# Patient Record
Sex: Female | Born: 1945
Health system: Southern US, Community
[De-identification: ages and names within clinical notes are randomized; demographics above are authoritative.]

## PROBLEM LIST (undated history)

## (undated) DIAGNOSIS — F419 Anxiety disorder, unspecified: Secondary | ICD-10-CM

## (undated) DIAGNOSIS — M1990 Unspecified osteoarthritis, unspecified site: Secondary | ICD-10-CM

## (undated) DIAGNOSIS — R413 Other amnesia: Secondary | ICD-10-CM

## (undated) DIAGNOSIS — N390 Urinary tract infection, site not specified: Secondary | ICD-10-CM

## (undated) DIAGNOSIS — I10 Essential (primary) hypertension: Secondary | ICD-10-CM

## (undated) DIAGNOSIS — J302 Other seasonal allergic rhinitis: Secondary | ICD-10-CM

## (undated) DIAGNOSIS — E039 Hypothyroidism, unspecified: Secondary | ICD-10-CM

## (undated) DIAGNOSIS — R001 Bradycardia, unspecified: Secondary | ICD-10-CM

## (undated) HISTORY — DX: Anxiety disorder, unspecified: F41.9

## (undated) HISTORY — DX: Hypothyroidism, unspecified: E03.9

## (undated) HISTORY — DX: Other amnesia: R41.3

## (undated) HISTORY — DX: Essential (primary) hypertension: I10

## (undated) HISTORY — PX: LAPAROSCOPIC GASTRIC SLEEVE RESECTION: SHX5895

## (undated) HISTORY — DX: Urinary tract infection, site not specified: N39.0

## (undated) HISTORY — PX: FINGER SURGERY: SHX640

## (undated) HISTORY — PX: KNEE SURGERY: SHX244

## (undated) HISTORY — PX: TONSILLECTOMY AND ADENOIDECTOMY: SHX28

## (undated) HISTORY — PX: BLEPHAROPLASTY: SUR158

## (undated) HISTORY — DX: Unspecified osteoarthritis, unspecified site: M19.90

## (undated) HISTORY — DX: Other seasonal allergic rhinitis: J30.2

## (undated) HISTORY — PX: CATARACT EXTRACTION, BILATERAL: SHX1313

---

## 2002-05-07 HISTORY — PX: HERNIA REPAIR: SHX51

## 2007-11-20 ENCOUNTER — Other Ambulatory Visit: Admission: RE | Admit: 2007-11-20 | Discharge: 2007-11-20 | Payer: Self-pay | Admitting: Obstetrics and Gynecology

## 2011-01-18 ENCOUNTER — Ambulatory Visit (INDEPENDENT_AMBULATORY_CARE_PROVIDER_SITE_OTHER): Payer: BC Managed Care – PPO | Admitting: Orthopedic Surgery

## 2011-01-18 ENCOUNTER — Encounter: Payer: Self-pay | Admitting: Orthopedic Surgery

## 2011-01-18 VITALS — BP 130/78 | Ht 65.0 in | Wt 248.0 lb

## 2011-01-18 DIAGNOSIS — S83289A Other tear of lateral meniscus, current injury, unspecified knee, initial encounter: Secondary | ICD-10-CM

## 2011-01-18 DIAGNOSIS — M1710 Unilateral primary osteoarthritis, unspecified knee: Secondary | ICD-10-CM

## 2011-01-18 DIAGNOSIS — M1712 Unilateral primary osteoarthritis, left knee: Secondary | ICD-10-CM

## 2011-01-18 NOTE — Progress Notes (Signed)
Chief complaint: LEFT knee pain, soreness, stiffness HPI:(66) A 65 year old female twisted her LEFT knee back in April still has lateral knee pain.  Complains of dull throbbing 5 out 10 intermittent pain which is worse with walking driving or riding better with elevation rest and wrapping with a brace or bandage.  The locking catching and swelling.  ROS:(2) She reports shortness of breath and wheezing with heartburn frequency joint pain swelling and instability stiffness muscle pain anxiety depression easy bruising bleeding cold intolerance and seasonal ALLERGY  PFSH: (1)  Past Medical History  Diagnosis Date  . HTN (hypertension)   . Hypothyroid   . Arthritis   . Asthma   . Seasonal allergies   . Urinary tract infection    Past Surgical History  Procedure Date  . Cesarean section   . Hernia repair   . Tonsillectomy and adenoidectomy      Physical Exam(12) GENERAL: normal development   CDV: pulses are normal   Skin: normal  Lymph: nodes were not palpable/normal  Psychiatric: awake, alert and oriented  Neuro: normal sensation  MSK She is ambulating today no assistive devices.  She has lateral tenderness no joint effusion.  Flexion equals 120.  Strength is normal.  Knee is stable.  McMurray sign was positive for lateral knee pain.  Jointline tenderness none on the medial side.  Ligaments are stable  Imaging: X-rays in the office valgus osteoarthritis  Assessment: Lateral meniscal tear in the setting of valgus osteoarthritis    Plan: MRI LEFT knee  Separate x-ray report  3 views LEFT knee  There is joint space narrowing medially and laterally with more joint space loss laterally.  Valgus knee.  Impression osteoarthritis with valgus deformity

## 2011-01-22 ENCOUNTER — Telehealth: Payer: Self-pay | Admitting: Radiology

## 2011-01-22 NOTE — Telephone Encounter (Signed)
I called to give the patient her MRI appointment at [facility] on 01-24-11 at 10:30. Patient has [employer], no precert is needed per Benjamin. Reference #[id number]. Patient will follow up here for her results.

## 2011-01-24 ENCOUNTER — Ambulatory Visit (HOSPITAL_COMMUNITY)
Admission: RE | Admit: 2011-01-24 | Discharge: 2011-01-24 | Disposition: A | Discharge status: Home / Self Care | Payer: BC Managed Care – PPO | Source: Ambulatory Visit | Attending: Orthopedic Surgery | Admitting: Orthopedic Surgery

## 2011-01-24 DIAGNOSIS — IMO0002 Reserved for concepts with insufficient information to code with codable children: Secondary | ICD-10-CM | POA: Insufficient documentation

## 2011-01-24 DIAGNOSIS — M1710 Unilateral primary osteoarthritis, unspecified knee: Secondary | ICD-10-CM | POA: Insufficient documentation

## 2011-01-24 DIAGNOSIS — M7120 Synovial cyst of popliteal space [Baker], unspecified knee: Secondary | ICD-10-CM | POA: Insufficient documentation

## 2011-01-24 DIAGNOSIS — S83289A Other tear of lateral meniscus, current injury, unspecified knee, initial encounter: Secondary | ICD-10-CM

## 2011-01-24 DIAGNOSIS — M25569 Pain in unspecified knee: Secondary | ICD-10-CM | POA: Insufficient documentation

## 2011-02-01 ENCOUNTER — Encounter: Payer: Self-pay | Admitting: Orthopedic Surgery

## 2011-02-01 ENCOUNTER — Ambulatory Visit (INDEPENDENT_AMBULATORY_CARE_PROVIDER_SITE_OTHER): Payer: BC Managed Care – PPO | Admitting: Orthopedic Surgery

## 2011-02-01 DIAGNOSIS — M1710 Unilateral primary osteoarthritis, unspecified knee: Secondary | ICD-10-CM

## 2011-02-01 DIAGNOSIS — S83289A Other tear of lateral meniscus, current injury, unspecified knee, initial encounter: Secondary | ICD-10-CM

## 2011-02-01 NOTE — Patient Instructions (Signed)
Come back the first of the year to recheck knee possibly discuss knee arthroscopy

## 2011-02-01 NOTE — Progress Notes (Signed)
Follow up visit  Diagnosis osteoarthritis Torn lateral meniscus LEFT knee MRI followup  MRI reviewed with report  Lateral meniscal degenerative type tears and lateral compartment gonarthrosis with multicompartment arthritis  We discussed her findings and her current function.  She has 2 maintain her at [facility] [place] right now and is managing fine with that.  I would think that she can continue this and we can see her at the first of the year to see if anything needs to be done which I would recommend arthroscopic surgery.  She does understand that long term she may need knee replacement surgery.  Final diagnosis osteoarthritis with lateral meniscal tear  Treatment plan conservative with scheduled followup

## 2011-05-16 ENCOUNTER — Telehealth: Payer: Self-pay | Admitting: Orthopedic Surgery

## 2011-05-16 NOTE — Telephone Encounter (Signed)
Patient elects to wait till May for knee re-eval appointment scheduled for 05/16/11, due to insurance reasons.  States her knee is not any worse, and she has been following the recommendations per Dr. Romeo Apple at her initial visit.  She has re-scheduled for May, which is when her new insurance will be effective, however, said if any problems, she will call back to request an earlier date for the appointment.

## 2011-05-17 ENCOUNTER — Ambulatory Visit: Payer: BC Managed Care – PPO | Admitting: Orthopedic Surgery

## 2011-09-13 ENCOUNTER — Encounter: Payer: Self-pay | Admitting: Orthopedic Surgery

## 2011-09-13 ENCOUNTER — Ambulatory Visit (INDEPENDENT_AMBULATORY_CARE_PROVIDER_SITE_OTHER): Payer: Medicare Other | Admitting: Orthopedic Surgery

## 2011-09-13 VITALS — BP 116/72 | Ht 65.0 in | Wt 237.0 lb

## 2011-09-13 DIAGNOSIS — Z9889 Other specified postprocedural states: Secondary | ICD-10-CM

## 2011-09-13 MED ORDER — HYDROCODONE-ACETAMINOPHEN 7.5-325 MG PO TABS: 1.0000 | ORAL_TABLET | ORAL | Status: AC | PRN

## 2011-09-13 NOTE — Patient Instructions (Addendum)
You have been scheduled for arthroscocpic knee surgery.  All surgeries carry some risk.  Remember you always have the option of continued nonsurgical treatment. However in this situation the risks vs. the benefits favor surgery as the best treatment option. The risks of the surgery includes the following but is not limited to bleeding, infection, pulmonary embolus, death from anesthesia, nerve injury vascular injury or need for further surgery, continued pain.  Specific to this procedure the following risks and complications are rare but possible Stiffness, pain, weakness, giving out  I expect  recovery will be in 3-4 weeks some patients take 6 weeks.  You  will need physical therapy after the procedure  STOP ALL NSAIDS AND BLOOD THINNERS   Arthroscopic Procedure, Knee An arthroscopic procedure can find what is wrong with your knee. PROCEDURE Arthroscopy is a surgical technique that allows your orthopedic surgeon to diagnose and treat your knee injury with accuracy. They will look into your knee through a small instrument. This is almost like a small (pencil sized) telescope. Because arthroscopy affects your knee less than open knee surgery, you can anticipate a more rapid recovery. Taking an active role by following your caregiver's instructions will help with rapid and complete recovery. Use crutches, rest, elevation, ice, and knee exercises as instructed. The length of recovery depends on various factors including type of injury, age, physical condition, medical conditions, and your rehabilitation. Your knee is the joint between the large bones (femur and tibia) in your leg. Cartilage covers these bone ends which are smooth and slippery and allow your knee to bend and move smoothly. Two menisci, thick, semi-lunar shaped pads of cartilage which form a rim inside the joint, help absorb shock and stabilize your knee. Ligaments bind the bones together and support your knee joint. Muscles move the joint,  help support your knee, and take stress off the joint itself. Because of this all programs and physical therapy to rehabilitate an injured or repaired knee require rebuilding and strengthening your muscles. AFTER THE PROCEDURE  After the procedure, you will be moved to a recovery area until most of the effects of the medication have worn off. Your caregiver will discuss the test results with you.   Only take over-the-counter or prescription medicines for pain, discomfort, or fever as directed by your caregiver.  SEEK MEDICAL CARE IF:    You have increased bleeding from your wounds.   You see redness, swelling, or have increasing pain in your wounds.   You have pus coming from your wound.   You have an oral temperature above 102 F (38.9 C).   You notice a bad smell coming from the wound or dressing.   You have severe pain with any motion of your knee.  SEEK IMMEDIATE MEDICAL CARE IF:    You develop a rash.   You have difficulty breathing.   You have any allergic problems.  Document Released: 04/20/2000 Document Revised: 04/12/2011 Document Reviewed: 11/12/2007 [facility] Patient Information 2012 [place], [place].

## 2011-09-13 NOTE — Progress Notes (Signed)
Patient ID: Lisa Parker, female   DOB: Oct 06, 1945, 66 y.o.   MRN: [id number] [facility]   Chief Complaint  Patient presents with  . Follow-up    recheck left knee   Followup left knee pain persists patient is scheduled for arthroscopic surgery history and physical be dictated at a later date incorporated by reference  Patient understands risks and benefits of surgery versus nonoperative treatment and wishes to proceed with arthroscopic surgery debridement and partial meniscectomy as needed.

## 2011-09-13 NOTE — H&P (Signed)
  Chief complaint: LEFT knee pain, soreness, stiffness   HPI: A 66 year old female twisted her LEFT knee back in April still has lateral knee pain. Complains of dull throbbing 5 out 10 intermittent pain which is worse with walking driving or riding better with elevation rest and wrapping with a brace or bandage. He has also experienced locking and catching of the knee. She works or manages an apple orchard and she could not have surgery after MRI showed arthritis and torn lateral meniscus. She was treated conservatively and now presents back wishing to have the surgical procedure on the left knee. In the past we have talked about replacement versus arthroscopy and she is willing to undergo arthroscopy understanding that she may knee replacement in the future.  The informed consent process was completed in the office details are noted in her progress Notes.  ROS: She reports shortness of breath and wheezing with heartburn frequency joint pain swelling and instability stiffness muscle pain anxiety depression easy bruising bleeding cold intolerance and seasonal ALLERGY   PFSH:   Past Medical History   Diagnosis  Date   .  HTN (hypertension)    .  Hypothyroid    .  Arthritis    .  Asthma    .  Seasonal allergies    .  Urinary tract infection     Past Surgical History   Procedure  Date   .  Cesarean section    .  Hernia repair    .  Tonsillectomy and adenoidectomy     History   Social History  . Marital Status: Married    Spouse Name: N/A    Number of Children: N/A  . Years of Education: college   Occupational History  . farm work    Social History Main Topics  . Smoking status: Never Smoker   . Smokeless tobacco: Not on file  . Alcohol Use: No  . Drug Use: No  . Sexually Active: Not on file   Other Topics Concern  . Not on file   Social History Narrative  . No narrative on file    Physical Exam(12)  Vital signs are stable as recorded  General appearance is  normal  The patient is alert and oriented x3  The patient's mood and affect are normal  The cardiovascular exam reveals normal pulses and temperature without edema swelling.  The lymphatic system is negative for palpable lymph nodes  The sensory exam is normal.  There are no pathologic reflexes.  Balance is normal.  Upper extremity exam  Inspection and palpation revealed no abnormalities in the upper extremities.  Range of motion is full without contracture.  Motor exam is normal with grade 5 strength.  The joints are fully reduced without subluxation.  There is no atrophy or tremor and muscle tone is normal.  All joints are stable.  She is ambulating today no assistive devices.  She has lateral tenderness no joint effusion. Flexion equals 120. Strength is normal. Knee is stable. McMurray sign was positive for lateral knee pain. Jointline tenderness none on the medial side. Ligaments are stable   MRI report  Diagnosis #1 lateral meniscal tear Diagnoses #2 osteoarthritis with valgus deformity  Plan arthroscopy left knee with partial lateral meniscectomy and debridement

## 2011-09-14 ENCOUNTER — Other Ambulatory Visit: Payer: Self-pay | Admitting: *Deleted

## 2011-09-17 ENCOUNTER — Encounter (HOSPITAL_COMMUNITY): Payer: Self-pay | Admitting: Pharmacist

## 2011-09-20 NOTE — Patient Instructions (Addendum)
20 Lisa Parker  09/20/2011   Your procedure is scheduled on:  Friday, May 24  [facility] to [facility] at [id number].  Call this number if you have problems the morning of surgery: [phone]   Remember:   Do not eat food:After Midnight.  May have clear liquids:until Midnight .  Clear liquids include soda, tea, black coffee, apple or grape juice, broth.  Take these medicines the morning of surgery with A SIP OF WATER: Levothyroxine, Lexapro,bisoprolol-hydrochlorothiazide,amlodipine-benazepril. Tylenol, if needed.   Do not wear jewelry, make-up or nail polish.  Do not wear lotions, powders, or perfumes. You may wear deodorant.  Do not shave 48 hours prior to surgery. Men may shave face and neck.  Do not bring valuables to the hospital.  Contacts, dentures or bridgework may not be worn into surgery.  Leave suitcase in the car. After surgery it may be brought to your room.  For patients admitted to the hospital, checkout time is 11:00 AM the day of discharge.   Patients discharged the day of surgery will not be allowed to drive home.  Name and phone number of your driver: Family  Special Instructions: CHG Shower Use Special Wash: 1/2 bottle night before surgery and 1/2 bottle morning of surgery.   Please read over the following fact sheets that you were given: Pain Booklet, Coughing and Deep Breathing, MRSA Information, Surgical Site Infection Prevention, Anesthesia Post-op Instructions and Care and Recovery After Surgery  PATIENT INSTRUCTIONS POST-ANESTHESIA  IMMEDIATELY FOLLOWING SURGERY:  Do not drive or operate machinery for the first twenty four hours after surgery.  Do not make any important decisions for twenty four hours after surgery or while taking narcotic pain medications or sedatives.  If you develop intractable nausea and vomiting or a severe headache please notify your doctor immediately.  FOLLOW-UP:  Please make an appointment with your surgeon as instructed. You do not need to  follow up with anesthesia unless specifically instructed to do so.  WOUND CARE INSTRUCTIONS (if applicable):  Keep a dry clean dressing on the anesthesia/puncture wound site if there is drainage.  Once the wound has quit draining you may leave it open to air.  Generally you should leave the bandage intact for twenty four hours unless there is drainage.  If the epidural site drains for more than 36-48 hours please call the anesthesia department.  QUESTIONS?:  Please feel free to call your physician or the hospital operator if you have any questions, and they will be happy to assist you.     Arthroscopic Procedure, Knee An arthroscopic procedure can find what is wrong with your knee. PROCEDURE Arthroscopy is a surgical technique that allows your orthopedic surgeon to diagnose and treat your knee injury with accuracy. They will look into your knee through a small instrument. This is almost like a small (pencil sized) telescope. Because arthroscopy affects your knee less than open knee surgery, you can anticipate a more rapid recovery. Taking an active role by following your caregiver's instructions will help with rapid and complete recovery. Use crutches, rest, elevation, ice, and knee exercises as instructed. The length of recovery depends on various factors including type of injury, age, physical condition, medical conditions, and your rehabilitation. Your knee is the joint between the large bones (femur and tibia) in your leg. Cartilage covers these bone ends which are smooth and slippery and allow your knee to bend and move smoothly. Two menisci, thick, semi-lunar shaped pads of cartilage which form a rim inside the  joint, help absorb shock and stabilize your knee. Ligaments bind the bones together and support your knee joint. Muscles move the joint, help support your knee, and take stress off the joint itself. Because of this all programs and physical therapy to rehabilitate an injured or repaired knee  require rebuilding and strengthening your muscles. AFTER THE PROCEDURE  After the procedure, you will be moved to a recovery area until most of the effects of the medication have worn off. Your caregiver will discuss the test results with you.   Only take over-the-counter or prescription medicines for pain, discomfort, or fever as directed by your caregiver.  SEEK MEDICAL CARE IF:   You have increased bleeding from your wounds.   You see redness, swelling, or have increasing pain in your wounds.   You have pus coming from your wound.   You have an oral temperature above 102 F (38.9 C).   You notice a bad smell coming from the wound or dressing.   You have severe pain with any motion of your knee.  SEEK IMMEDIATE MEDICAL CARE IF:   You develop a rash.   You have difficulty breathing.   You have any allergic problems.   Surgery for Meniscus Tear with Phase I Rehab INDICATIONS - (WHO NEEDS SURGERY, WHEN, WHY, AND GOALS)   Surgery is advised for people with meniscus tears who experience locking, recurring swelling, and/or giving way of the knee.   Surgery is advised if non-surgical treatment has failed.   Sometimes, surgery is advised for people with pain or tenderness along the joint line.   Surgery is advised for people with displaced tears that prevent full knee range of motion ("locked knee"), or if there is a sign of a "bucket handle" tear (meniscus tears and flips to the center of the knee).   Surgery to repair a meniscus tear is elective (the patient chooses to have surgery). There is no evidence that the timing of surgery has an affect on the outcome of surgery.   Menisci have poor blood supply and little ability to heal. For this reason, less than 20% of all meniscus tears are repairable by sewing (suturing) them together. The rest are treated by removal of all or part of the meniscus (meniscectomy).   If a meniscectomy is performed, the meniscus does not reform.    If the meniscus tear occurs without an anterior cruciate ligament (ACL) tear, the success rate of surgery is approximately 80%. However, if an ACL tear is present, the success rate is about 40%. If the meniscus tear is repairable, most surgeons also recommend reconstructing the ACL.   One function of the meniscus is to reduce forces in the knee. Because of this, the loss of meniscus cartilage is linked with the early development of arthritis of the knee joint. The goal of meniscus surgery is to remove as little of the meniscus as possible.   Removing all or part of a torn meniscus allows for shaping of the cartilage and removal of torn edges, which prevents:   Progression of the tear (making a tear larger).   Displacement of the tear, causing recurring symptoms of locking, giving way, and swelling.   If a meniscus tear does not cause problems, it may be left alone. However, torn meniscus cartilage does not function, and the development of arthritis or symptoms such as locking, swelling, and giving way may still occur. Further, tears may progress to become larger, if left untreated.  CONTRAINDICATIONS - (REASONS NOT  TO OPERATE)   Infection of the knee.   Inability or unwillingness to complete a rehabilitation program.   Pain or symptoms not related to the meniscus.   Arthritis of the knee, which causes the (symptomatic) meniscus tear.  RISKS AND COMPLICATIONS  Infection.   Bleeding.   Injury to nerves (numbness, weakness, paralysis).   Recurring symptoms (giving way, locking, swelling), including tearing the remaining meniscus if removal of the affected meniscus (meniscectomy) is performed, and re-tear or nonhealing of the meniscus repair.   Knee stiffness (loss of knee motion).   Continued pain.   Weakness of the thigh (quadriceps) muscles.   Do not eat or drink anything before surgery. Solid food makes general anesthesia more hazardous.  PROCEDURE  Meniscus tear surgery is  performed through an incision near the joint (arthroscopically), and you go home the same day as surgery (outpatient basis). The procedure may be completed with general anesthesia, spinal anesthesia, or local anesthesia. The procedure involves using small power tools to remove the torn portion of the meniscus. If the tear is repairable, the edges of the tear are freshened. Then, sewing (sutures), anchors, or tacks are used to hold the torn edges together, while the meniscus heals.  AFTER THE PROCEDURE   Keep the wound clean and dry after the surgery (usually two weeks).   Keep the foot and ankle elevated above heart level whenever possible, for the first 1 to 2 weeks after surgery.   You will be given pain medicines by your caregiver.   Ice the knee to reduce inflammation.   You may put as much weight on the operated leg as possible, although you will often be given crutches after surgery, until you can walk without a limp.   If the tear is repaired (sewn back together), you may be given a brace, and possibly be allowed to bear full weight on the operated leg while you are wearing the brace, for varying periods (depends on your caregiver).   After surgery, rehabilitation and exercises are important to regain motion, and then strength.  RETURN TO SPORTS   Return to sports depends on many factors including:   Type of procedure (meniscus repair or removal).   Type of sport.   Position played.   It may take 6 weeks before sports can be resumed after meniscus removal (although it may be as early as 1 to 2 weeks), or 6 to 9 months after a meniscus repair.   Full knee motion and strength are needed, before sports can be resumed.  SEEK MEDICAL CARE IF:   You experience pain, numbness, or coldness in the foot.   Any of the following signs of infection occur after surgery: fever, increased pain, swelling, redness, drainage of fluids, or bleeding in the affected area.   New, unexplained  symptoms develop. (Drugs used in treatment may produce side effects.)

## 2011-09-21 ENCOUNTER — Encounter (HOSPITAL_COMMUNITY): Payer: Self-pay

## 2011-09-21 ENCOUNTER — Encounter (HOSPITAL_COMMUNITY)
Admission: RE | Admit: 2011-09-21 | Discharge: 2011-09-21 | Disposition: A | Discharge status: Home / Self Care | Payer: Medicare Other | Source: Ambulatory Visit | Attending: Orthopedic Surgery | Admitting: Orthopedic Surgery

## 2011-09-21 ENCOUNTER — Telehealth: Payer: Self-pay | Admitting: Orthopedic Surgery

## 2011-09-21 LAB — SURGICAL PCR SCREEN: MRSA, PCR: NEGATIVE

## 2011-09-21 LAB — BASIC METABOLIC PANEL
BUN: 19 mg/dL (ref 6–23)
CO2: 32 mEq/L (ref 19–32)
Calcium: 9.9 mg/dL (ref 8.4–10.5)
Chloride: 101 mEq/L (ref 96–112)
Creatinine, Ser: 0.72 mg/dL (ref 0.50–1.10)
GFR calc Af Amer: >90 mL/min (ref >90)
GFR calc non Af Amer: 88 mL/min — ABNORMAL LOW (ref >90)
Glucose, Bld: 97 mg/dL (ref 70–99)
Potassium: 4.4 mEq/L (ref 3.5–5.1)
Sodium: 140 mEq/L (ref 135–145)

## 2011-09-21 LAB — HEMOGLOBIN AND HEMATOCRIT, BLOOD
HCT: 43.9 % (ref 36.0–46.0)
Hemoglobin: 14.3 g/dL (ref 12.0–15.0)

## 2011-09-21 MED ORDER — CHLORHEXIDINE GLUCONATE 4 % EX LIQD
60.0000 mL | Freq: Once | CUTANEOUS | Status: DC
  Filled 2011-09-21: qty 60

## 2011-09-21 NOTE — Telephone Encounter (Signed)
Contacted insurer, [employer] at ph# [phone] regarding out-patient surgery scheduled 09/28/11 at [facility].  CPT codes: 09811, [phone].  Per representative Bonnita Levan, no pre-authorization is required for these procedures for in-network providers.

## 2011-09-28 ENCOUNTER — Encounter (HOSPITAL_COMMUNITY): Payer: Self-pay | Admitting: Anesthesiology

## 2011-09-28 ENCOUNTER — Ambulatory Visit (HOSPITAL_COMMUNITY)
Admission: RE | Admit: 2011-09-28 | Discharge: 2011-09-28 | Disposition: A | Discharge status: Home / Self Care | Payer: Medicare Other | Source: Ambulatory Visit | Attending: Orthopedic Surgery | Admitting: Orthopedic Surgery

## 2011-09-28 ENCOUNTER — Encounter (HOSPITAL_COMMUNITY)
Admission: RE | Disposition: A | Discharge status: Home / Self Care | Payer: Self-pay | Source: Ambulatory Visit | Attending: Orthopedic Surgery

## 2011-09-28 ENCOUNTER — Encounter (HOSPITAL_COMMUNITY): Payer: Self-pay | Admitting: *Deleted

## 2011-09-28 ENCOUNTER — Ambulatory Visit (HOSPITAL_COMMUNITY): Payer: Medicare Other | Admitting: Anesthesiology

## 2011-09-28 DIAGNOSIS — Z79899 Other long term (current) drug therapy: Secondary | ICD-10-CM | POA: Insufficient documentation

## 2011-09-28 DIAGNOSIS — I10 Essential (primary) hypertension: Secondary | ICD-10-CM | POA: Insufficient documentation

## 2011-09-28 DIAGNOSIS — M23359 Other meniscus derangements, posterior horn of lateral meniscus, unspecified knee: Secondary | ICD-10-CM

## 2011-09-28 DIAGNOSIS — M23349 Other meniscus derangements, anterior horn of lateral meniscus, unspecified knee: Secondary | ICD-10-CM

## 2011-09-28 DIAGNOSIS — X500XXA Overexertion from strenuous movement or load, initial encounter: Secondary | ICD-10-CM | POA: Insufficient documentation

## 2011-09-28 DIAGNOSIS — M1710 Unilateral primary osteoarthritis, unspecified knee: Secondary | ICD-10-CM | POA: Insufficient documentation

## 2011-09-28 DIAGNOSIS — IMO0002 Reserved for concepts with insufficient information to code with codable children: Secondary | ICD-10-CM | POA: Insufficient documentation

## 2011-09-28 DIAGNOSIS — S83289A Other tear of lateral meniscus, current injury, unspecified knee, initial encounter: Secondary | ICD-10-CM | POA: Insufficient documentation

## 2011-09-28 SURGERY — ARTHROSCOPY, KNEE, WITH LATERAL MENISCECTOMY
Anesthesia: General | Site: Knee | Laterality: Left | Wound class: Clean

## 2011-09-28 MED ORDER — ONDANSETRON HCL 4 MG/2ML IJ SOLN: 4.0000 mg | Freq: Once | INTRAMUSCULAR | Status: DC

## 2011-09-28 MED ORDER — PROMETHAZINE HCL 12.5 MG PO TABS: 12.5000 mg | ORAL_TABLET | Freq: Four times a day (QID) | ORAL | Status: DC | PRN

## 2011-09-28 MED ORDER — FENTANYL CITRATE 0.05 MG/ML IJ SOLN
INTRAMUSCULAR | Status: DC | PRN
  Administered 2011-09-28 (×4): 25 ug via INTRAVENOUS

## 2011-09-28 MED ORDER — LIDOCAINE HCL (PF) 1 % IJ SOLN
INTRAMUSCULAR | Status: AC
  Filled 2011-09-28: qty 5

## 2011-09-28 MED ORDER — ACETAMINOPHEN 10 MG/ML IV SOLN
1000.0000 mg | Freq: Once | INTRAVENOUS | Status: AC
  Administered 2011-09-28: 1000 mg via INTRAVENOUS

## 2011-09-28 MED ORDER — SODIUM CHLORIDE 0.9 % IR SOLN
Status: DC | PRN
  Administered 2011-09-28 (×4)

## 2011-09-28 MED ORDER — LIDOCAINE HCL (CARDIAC) 10 MG/ML IV SOLN
INTRAVENOUS | Status: DC | PRN
  Administered 2011-09-28: 50 mg via INTRAVENOUS

## 2011-09-28 MED ORDER — LACTATED RINGERS IV SOLN
INTRAVENOUS | Status: DC
  Administered 2011-09-28: 1000 mL via INTRAVENOUS

## 2011-09-28 MED ORDER — CEFAZOLIN SODIUM 1-5 GM-% IV SOLN
INTRAVENOUS | Status: DC | PRN
  Administered 2011-09-28: 2 g via INTRAVENOUS

## 2011-09-28 MED ORDER — ONDANSETRON HCL 4 MG/2ML IJ SOLN
INTRAMUSCULAR | Status: AC
  Administered 2011-09-28: 4 mg via INTRAVENOUS
  Filled 2011-09-28: qty 2

## 2011-09-28 MED ORDER — CELECOXIB 100 MG PO CAPS
ORAL_CAPSULE | ORAL | Status: AC
  Administered 2011-09-28: 400 mg via ORAL
  Filled 2011-09-28: qty 4

## 2011-09-28 MED ORDER — CEFAZOLIN SODIUM-DEXTROSE 2-3 GM-% IV SOLR: 2.0000 g | INTRAVENOUS | Status: DC

## 2011-09-28 MED ORDER — ONDANSETRON HCL 4 MG/2ML IJ SOLN
4.0000 mg | Freq: Once | INTRAMUSCULAR | Status: AC
  Administered 2011-09-28: 4 mg via INTRAVENOUS

## 2011-09-28 MED ORDER — CEFAZOLIN SODIUM 1-5 GM-% IV SOLN
INTRAVENOUS | Status: AC
  Filled 2011-09-28: qty 50

## 2011-09-28 MED ORDER — TRAMADOL HCL 50 MG PO TABS
ORAL_TABLET | ORAL | Status: AC
  Administered 2011-09-28: 50 mg via ORAL
  Filled 2011-09-28: qty 1

## 2011-09-28 MED ORDER — HYDROCODONE-ACETAMINOPHEN 7.5-325 MG PO TABS: 1.0000 | ORAL_TABLET | Freq: Four times a day (QID) | ORAL | Status: AC | PRN

## 2011-09-28 MED ORDER — ONDANSETRON HCL 4 MG/2ML IJ SOLN
4.0000 mg | Freq: Once | INTRAMUSCULAR | Status: AC | PRN
  Administered 2011-09-28: 4 mg via INTRAVENOUS

## 2011-09-28 MED ORDER — FENTANYL CITRATE 0.05 MG/ML IJ SOLN: 25.0000 ug | INTRAMUSCULAR | Status: DC | PRN

## 2011-09-28 MED ORDER — CEFAZOLIN SODIUM-DEXTROSE 2-3 GM-% IV SOLR
INTRAVENOUS | Status: AC
  Filled 2011-09-28: qty 50

## 2011-09-28 MED ORDER — GLYCOPYRROLATE 0.2 MG/ML IJ SOLN
0.2000 mg | Freq: Once | INTRAMUSCULAR | Status: AC
  Administered 2011-09-28: 0.2 mg via INTRAVENOUS

## 2011-09-28 MED ORDER — TRAMADOL HCL 50 MG PO TABS
50.0000 mg | ORAL_TABLET | Freq: Once | ORAL | Status: AC
  Administered 2011-09-28: 50 mg via ORAL

## 2011-09-28 MED ORDER — LACTATED RINGERS IV SOLN
INTRAVENOUS | Status: DC | PRN
  Administered 2011-09-28: 08:00:00 via INTRAVENOUS

## 2011-09-28 MED ORDER — PROPOFOL 10 MG/ML IV EMUL
INTRAVENOUS | Status: DC | PRN
  Administered 2011-09-28: 150 mg via INTRAVENOUS

## 2011-09-28 MED ORDER — PROPOFOL 10 MG/ML IV EMUL
INTRAVENOUS | Status: AC
  Filled 2011-09-28: qty 20

## 2011-09-28 MED ORDER — ACETAMINOPHEN 10 MG/ML IV SOLN
INTRAVENOUS | Status: AC
  Administered 2011-09-28: 1000 mg via INTRAVENOUS
  Filled 2011-09-28: qty 100

## 2011-09-28 MED ORDER — GLYCOPYRROLATE 0.2 MG/ML IJ SOLN
INTRAMUSCULAR | Status: AC
  Administered 2011-09-28: 0.2 mg via INTRAVENOUS
  Filled 2011-09-28: qty 1

## 2011-09-28 MED ORDER — CELECOXIB 100 MG PO CAPS
400.0000 mg | ORAL_CAPSULE | Freq: Once | ORAL | Status: AC
  Administered 2011-09-28: 400 mg via ORAL

## 2011-09-28 MED ORDER — MIDAZOLAM HCL 2 MG/2ML IJ SOLN
INTRAMUSCULAR | Status: AC
  Administered 2011-09-28: 2 mg via INTRAVENOUS
  Filled 2011-09-28: qty 2

## 2011-09-28 MED ORDER — EPINEPHRINE HCL 1 MG/ML IJ SOLN
INTRAMUSCULAR | Status: AC
  Filled 2011-09-28: qty 2

## 2011-09-28 MED ORDER — BUPIVACAINE-EPINEPHRINE PF 0.5-1:200000 % IJ SOLN
INTRAMUSCULAR | Status: DC | PRN
  Administered 2011-09-28: 60 mL

## 2011-09-28 MED ORDER — FENTANYL CITRATE 0.05 MG/ML IJ SOLN
INTRAMUSCULAR | Status: AC
  Filled 2011-09-28: qty 2

## 2011-09-28 MED ORDER — MIDAZOLAM HCL 2 MG/2ML IJ SOLN
1.0000 mg | INTRAMUSCULAR | Status: DC | PRN
  Administered 2011-09-28: 2 mg via INTRAVENOUS

## 2011-09-28 MED ORDER — ACETAMINOPHEN 325 MG PO TABS: 325.0000 mg | ORAL_TABLET | ORAL | Status: DC | PRN

## 2011-09-28 MED ORDER — ACETAMINOPHEN 10 MG/ML IV SOLN
INTRAVENOUS | Status: AC
  Filled 2011-09-28: qty 100

## 2011-09-28 MED ORDER — BUPIVACAINE-EPINEPHRINE PF 0.5-1:200000 % IJ SOLN
INTRAMUSCULAR | Status: AC
  Filled 2011-09-28: qty 20

## 2011-09-28 SURGICAL SUPPLY — 56 items
ARTHROWAND PARAGON T2 (SURGICAL WAND)
BAG HAMPER (MISCELLANEOUS) ×2 IMPLANT
BANDAGE ELASTIC 6 VELCRO NS (GAUZE/BANDAGES/DRESSINGS) ×2 IMPLANT
BLADE AGGRESSIVE PLUS 4.0 (BLADE) ×2 IMPLANT
BLADE SURG SZ11 CARB STEEL (BLADE) ×2 IMPLANT
CHLORAPREP W/TINT 26ML (MISCELLANEOUS) ×4 IMPLANT
CLOTH BEACON ORANGE TIMEOUT ST (SAFETY) ×2 IMPLANT
COOLER CRYO IC GRAV AND TUBE (ORTHOPEDIC SUPPLIES) ×2 IMPLANT
CUFF CRYO KNEE LG 20X31 COOLER (ORTHOPEDIC SUPPLIES) ×1 IMPLANT
CUFF CRYO KNEE18X23 MED (MISCELLANEOUS) IMPLANT
CUFF TOURNIQUET SINGLE 34IN LL (TOURNIQUET CUFF) IMPLANT
CUFF TOURNIQUET SINGLE 44IN (TOURNIQUET CUFF) ×1 IMPLANT
CUTTER ANGLED DBL BITE 4.5 (BURR) IMPLANT
DECANTER SPIKE VIAL GLASS SM (MISCELLANEOUS) ×4 IMPLANT
FLOOR PAD 36X40 (MISCELLANEOUS)
GAUZE SPONGE 4X4 16PLY XRAY LF (GAUZE/BANDAGES/DRESSINGS) ×2 IMPLANT
GAUZE XEROFORM 5X9 LF (GAUZE/BANDAGES/DRESSINGS) ×2 IMPLANT
GLOVE BIOGEL PI IND STRL 7.0 (GLOVE) IMPLANT
GLOVE BIOGEL PI INDICATOR 7.0 (GLOVE) ×1
GLOVE ECLIPSE 6.5 STRL STRAW (GLOVE) ×1 IMPLANT
GLOVE EXAM NITRILE MD LF STRL (GLOVE) ×2 IMPLANT
GLOVE SKINSENSE NS SZ8.0 LF (GLOVE) ×1
GLOVE SKINSENSE STRL SZ8.0 LF (GLOVE) ×1 IMPLANT
GLOVE SS N UNI LF 8.5 STRL (GLOVE) ×2 IMPLANT
GOWN STRL REIN XL XLG (GOWN DISPOSABLE) ×6 IMPLANT
HLDR LEG FOAM (MISCELLANEOUS) ×1 IMPLANT
IV NS IRRIG 3000ML ARTHROMATIC (IV SOLUTION) ×5 IMPLANT
KIT BLADEGUARD II DBL (SET/KITS/TRAYS/PACK) ×2 IMPLANT
KIT ROOM TURNOVER AP CYSTO (KITS) ×2 IMPLANT
LEG HOLDER FOAM (MISCELLANEOUS) ×1
MANIFOLD NEPTUNE II (INSTRUMENTS) ×2 IMPLANT
MARKER SKIN DUAL TIP RULER LAB (MISCELLANEOUS) ×2 IMPLANT
NDL HYPO 18GX1.5 BLUNT FILL (NEEDLE) ×1 IMPLANT
NDL HYPO 21X1.5 SAFETY (NEEDLE) ×1 IMPLANT
NDL SPNL 18GX3.5 QUINCKE PK (NEEDLE) ×1 IMPLANT
NEEDLE HYPO 18GX1.5 BLUNT FILL (NEEDLE) ×2 IMPLANT
NEEDLE HYPO 21X1.5 SAFETY (NEEDLE) ×2 IMPLANT
NEEDLE SPNL 18GX3.5 QUINCKE PK (NEEDLE) ×2 IMPLANT
NS IRRIG 1000ML POUR BTL (IV SOLUTION) ×2 IMPLANT
PACK ARTHRO LIMB DRAPE STRL (MISCELLANEOUS) ×2 IMPLANT
PAD ABD 5X9 TENDERSORB (GAUZE/BANDAGES/DRESSINGS) ×2 IMPLANT
PAD ARMBOARD 7.5X6 YLW CONV (MISCELLANEOUS) ×2 IMPLANT
PAD FLOOR 36X40 (MISCELLANEOUS) ×1 IMPLANT
PADDING CAST COTTON 6X4 STRL (CAST SUPPLIES) ×2 IMPLANT
SET ARTHROSCOPY INST (INSTRUMENTS) ×2 IMPLANT
SET ARTHROSCOPY PUMP TUBE (IRRIGATION / IRRIGATOR) ×2 IMPLANT
SET BASIN LINEN APH (SET/KITS/TRAYS/PACK) ×2 IMPLANT
SPONGE GAUZE 4X4 12PLY (GAUZE/BANDAGES/DRESSINGS) ×2 IMPLANT
STRIP CLOSURE SKIN 1/2X4 (GAUZE/BANDAGES/DRESSINGS) ×1 IMPLANT
SUT ETHILON 3 0 FSL (SUTURE) IMPLANT
SYR 30ML LL (SYRINGE) ×2 IMPLANT
SYRINGE 10CC LL (SYRINGE) ×2 IMPLANT
WAND 50 DEG COVAC W/CORD (SURGICAL WAND) ×1 IMPLANT
WAND 90 DEG TURBOVAC W/CORD (SURGICAL WAND) IMPLANT
WAND ARTHRO PARAGON T2 (SURGICAL WAND) IMPLANT
YANKAUER SUCT BULB TIP 10FT TU (MISCELLANEOUS) ×7 IMPLANT

## 2011-09-28 NOTE — Anesthesia Procedure Notes (Signed)
Procedure Name: LMA Insertion Date/Time: 09/28/2011 9:14 AM Performed by: Carolyne Littles, Saliyah Gillin L Pre-anesthesia Checklist: Patient identified, Timeout performed, Emergency Drugs available, Suction available and Patient being monitored Patient Re-evaluated:Patient Re-evaluated prior to inductionOxygen Delivery Method: Circle system utilized Intubation Type: IV induction Ventilation: Mask ventilation without difficulty LMA: LMA inserted LMA Size: 4.0 Placement Confirmation: positive ETCO2 and breath sounds checked- equal and bilateral Tube secured with: Tape Dental Injury: Teeth and Oropharynx as per pre-operative assessment

## 2011-09-28 NOTE — Progress Notes (Signed)
Awake. Denies pain. Wants to go home. Ready to "get up."

## 2011-09-28 NOTE — Op Note (Signed)
09/28/2011  10:18 AM  PATIENT:  Lisa Parker  66 y.o. female  PRE-OPERATIVE DIAGNOSIS:  lateral meniscus tear left knee  POST-OPERATIVE DIAGNOSIS:  lateral meniscus tear left knee, osteoarthritis  PROCEDURE:  Procedure(s) (LRB): ARTHROSCOPY LEFT KNEE, PARTIAL LATERAL MENISECTOMY WITH DEBRIDEMENT   FINDINGS: GRADE 4 OA LATERAL COMPARTMENT BOTH SIDES OF THE JOINT  LATERAL MENISCUS TEAR  SYNOVITIS   Procedure Details: The procedural site was confirmed as left knee and marked as such. The chart was reviewed the chart was updated and the consent was signed. The patient was taken to the operating room and given a preoperative antibiotic. She was then intubated. She was in the supine position. The left leg was prepped and draped in sterile technique  The timeout procedure was executed.  A lateral portal was established the scope was placed into the joint. A diagnostic arthroscopy was performed. Starting in the medial compartment it was noted that the patient a small undersurface tear at the junction of the dear horn and body.  The notch was noted to have degenerative changes of the anterior cruciate ligament but no frank tearing. There were 2 anterior spurs and assist in the tibial fibers of the anterior cruciate ligament. The lateral compartment had a lateral meniscal tear and grade 4 changes of the femur and tibial surfaces. The mid body of the lateral meniscus was severely deficient and torn. The patellofemoral joint had grade 2 changes of the cartilage.  A medial portal was established with a spinal needle. A motorized shaver was placed in the joint and the lateral meniscus tear was resected. The meniscus was balanced to a stable rim and this was confirmed with a probe.  The anterior spur was removed with the shaver. The medial meniscal tear was removed with a 50 ArthroCare wand and the shaver.  Joint was irrigated and closed with 3-0 nylon sutures. A sterile dressing was applied.  Cryo/Cuff was activated. Patient was extubated and taken to recovery room in stable condition  Her prognosis is poor secondary to the chondral changes.  SURGEON:  Surgeon(s) and Role:    * Vickki Hearing, MD - Primary  PHYSICIAN ASSISTANT:   ASSISTANTS: none   ANESTHESIA:   general  EBL:  Total I/O In: 500 [I.V.:500] Out: -   BLOOD ADMINISTERED:none  DRAINS: none   LOCAL MEDICATIONS USED:  MARCAINE   , Amount: 60 ml and OTHER EPI  SPECIMEN:  No Specimen  DISPOSITION OF SPECIMEN:  N/A  COUNTS:  YES  TOURNIQUET:  * No tourniquets in log *  DICTATION: .Dragon Dictation  PLAN OF CARE: Discharge to home after PACU  PATIENT DISPOSITION:  PACU STABLE    Delay start of Pharmacological VTE agent (>24hrs) due to surgical blood loss or risk of bleeding: not applicable

## 2011-09-28 NOTE — Transfer of Care (Signed)
Immediate Anesthesia Transfer of Care Note  Patient: Lisa Parker  Procedure(s) Performed: Procedure(s) (LRB): ARTHROSCOPY KNEE (Left)  Patient Location: PACU  Anesthesia Type: General  Level of Consciousness: awake, alert , oriented and patient cooperative  Airway & Oxygen Therapy: Patient Spontanous Breathing and Patient connected to face mask oxygen  Post-op Assessment: Report given to PACU RN and Post -op Vital signs reviewed and stable  Post vital signs: Reviewed and stable  Complications: No apparent anesthesia complications

## 2011-09-28 NOTE — Interval H&P Note (Signed)
History and Physical Interval Note:  09/28/2011 8:46 AM  Lisa Parker  has presented today for surgery, with the diagnosis of lateral meniscus tear left  The various methods of treatment have been discussed with the patient and family. After consideration of risks, benefits and other options for treatment, the patient has consented to  Procedure(s) (LRB): KNEE ARTHROSCOPY WITH LATERAL MENISECTOMY (Left) as a surgical intervention .  The patients' history has been reviewed, patient examined, no change in status, stable for surgery.  I have reviewed the patients' chart and labs.  Questions were answered to the patient's satisfaction.     Fuller Canada

## 2011-09-28 NOTE — Brief Op Note (Signed)
09/28/2011  10:18 AM  PATIENT:  Lisa Parker  66 y.o. female  PRE-OPERATIVE DIAGNOSIS:  lateral meniscus tear left knee  POST-OPERATIVE DIAGNOSIS:  lateral meniscus tear left knee, osteoarthritis  PROCEDURE:  Procedure(s) (LRB): ARTHROSCOPY LEFT KNEE, PARTIAL LATERAL MENISECTOMY WITH DEBRIDEMENT   FINDINGS: GRADE 4 OA LATERAL COMPARTMENT BOTH SIDES OF THE JOINT  LATERAL MENISCUS TEAR  SYNOVITIS   SURGEON:  Surgeon(s) and Role:    * Vickki Hearing, MD - Primary  PHYSICIAN ASSISTANT:   ASSISTANTS: none   ANESTHESIA:   general  EBL:  Total I/O In: 500 [I.V.:500] Out: -   BLOOD ADMINISTERED:none  DRAINS: none   LOCAL MEDICATIONS USED:  MARCAINE   , Amount: 60 ml and OTHER EPI  SPECIMEN:  No Specimen  DISPOSITION OF SPECIMEN:  N/A  COUNTS:  YES  TOURNIQUET:  * No tourniquets in log *  DICTATION: .Dragon Dictation  PLAN OF CARE: Discharge to home after PACU  PATIENT DISPOSITION:  PACU STABLE    Delay start of Pharmacological VTE agent (>24hrs) due to surgical blood loss or risk of bleeding: not applicable

## 2011-09-28 NOTE — Discharge Instructions (Signed)
Arthroscopic Procedure, Knee An arthroscopic procedure can find what is wrong with your knee. PROCEDURE Arthroscopy is a surgical technique that allows your orthopedic surgeon to diagnose and treat your knee injury with accuracy. They will look into your knee through a small instrument. This is almost like a small (pencil sized) telescope. Because arthroscopy affects your knee less than open knee surgery, you can anticipate a more rapid recovery. Taking an active role by following your caregiver's instructions will help with rapid and complete recovery. Use crutches, rest, elevation, ice, and knee exercises as instructed. The length of recovery depends on various factors including type of injury, age, physical condition, medical conditions, and your rehabilitation. Your knee is the joint between the large bones (femur and tibia) in your leg. Cartilage covers these bone ends which are smooth and slippery and allow your knee to bend and move smoothly. Two menisci, thick, semi-lunar shaped pads of cartilage which form a rim inside the joint, help absorb shock and stabilize your knee. Ligaments bind the bones together and support your knee joint. Muscles move the joint, help support your knee, and take stress off the joint itself. Because of this all programs and physical therapy to rehabilitate an injured or repaired knee require rebuilding and strengthening your muscles. AFTER THE PROCEDURE  After the procedure, you will be moved to a recovery area until most of the effects of the medication have worn off. Your caregiver will discuss the test results with you.   Only take over-the-counter or prescription medicines for pain, discomfort, or fever as directed by your caregiver.  SEEK MEDICAL CARE IF:   You have increased bleeding from your wounds.   You see redness, swelling, or have increasing pain in your wounds.   You have pus coming from your wound.   You have an oral temperature above 102 F (38.9  C).   You notice a bad smell coming from the wound or dressing.    SEEK IMMEDIATE MEDICAL CARE IF:   You develop a rash.   You have difficulty breathing.   You have any allergic problems.  Document Released: 04/20/2000 Document Revised: 04/12/2011 Document Reviewed: 11/12/2007 [facility] Patient Information 2012 [place], [place].

## 2011-09-28 NOTE — Anesthesia Preprocedure Evaluation (Signed)
Anesthesia Evaluation  Patient identified by MRN, date of birth, ID band Patient awake    Reviewed: Allergy & Precautions, H&P , NPO status , Patient's Chart, lab work & pertinent test results, reviewed documented beta blocker date and time   Airway Mallampati: I TM Distance: >3 FB Neck ROM: Full    Dental  (+) Partial Upper   Pulmonary asthma ,    Pulmonary exam normal       Cardiovascular hypertension, Pt. on medications and Pt. on home beta blockers Rhythm:Regular Rate:Bradycardia     Neuro/Psych negative neurological ROS  negative psych ROS   GI/Hepatic negative GI ROS, Neg liver ROS,   Endo/Other  Hypothyroidism   Renal/GU negative Renal ROS     Musculoskeletal  (+) Arthritis -, Osteoarthritis,    Abdominal (+) + obese,  Abdomen: soft.    Peds  Hematology negative hematology ROS (+)   Anesthesia Other Findings   Reproductive/Obstetrics                           Anesthesia Physical Anesthesia Plan  ASA: II  Anesthesia Plan: General   Post-op Pain Management:    Induction: Intravenous  Airway Management Planned: LMA  Additional Equipment:   Intra-op Plan:   Post-operative Plan: Extubation in OR  Informed Consent: I have reviewed the patients History and Physical, chart, labs and discussed the procedure including the risks, benefits and alternatives for the proposed anesthesia with the patient or authorized representative who has indicated his/her understanding and acceptance.     Plan Discussed with: CRNA  Anesthesia Plan Comments:         Anesthesia Quick Evaluation

## 2011-09-28 NOTE — Preoperative (Signed)
Beta Blockers   Reason not to administer Beta Blockers:Not Applicable 

## 2011-09-28 NOTE — H&P (View-Only) (Signed)
  Chief complaint: LEFT knee pain, soreness, stiffness   HPI: A 66-year-old female twisted her LEFT knee back in April still has lateral knee pain. Complains of dull throbbing 5 out 10 intermittent pain which is worse with walking driving or riding better with elevation rest and wrapping with a brace or bandage. He has also experienced locking and catching of the knee. She works or manages an apple orchard and she could not have surgery after MRI showed arthritis and torn lateral meniscus. She was treated conservatively and now presents back wishing to have the surgical procedure on the left knee. In the past we have talked about replacement versus arthroscopy and she is willing to undergo arthroscopy understanding that she may knee replacement in the future.  The informed consent process was completed in the office details are noted in her progress Notes.  ROS: She reports shortness of breath and wheezing with heartburn frequency joint pain swelling and instability stiffness muscle pain anxiety depression easy bruising bleeding cold intolerance and seasonal ALLERGY   PFSH:   Past Medical History   Diagnosis  Date   .  HTN (hypertension)    .  Hypothyroid    .  Arthritis    .  Asthma    .  Seasonal allergies    .  Urinary tract infection     Past Surgical History   Procedure  Date   .  Cesarean section    .  Hernia repair    .  Tonsillectomy and adenoidectomy     History   Social History  . Marital Status: Married    Spouse Name: N/A    Number of Children: N/A  . Years of Education: college   Occupational History  . farm work    Social History Main Topics  . Smoking status: Never Smoker   . Smokeless tobacco: Not on file  . Alcohol Use: No  . Drug Use: No  . Sexually Active: Not on file   Other Topics Concern  . Not on file   Social History Narrative  . No narrative on file    Physical Exam(12)  Vital signs are stable as recorded  General appearance is  normal  The patient is alert and oriented x3  The patient's mood and affect are normal  The cardiovascular exam reveals normal pulses and temperature without edema swelling.  The lymphatic system is negative for palpable lymph nodes  The sensory exam is normal.  There are no pathologic reflexes.  Balance is normal.  Upper extremity exam  Inspection and palpation revealed no abnormalities in the upper extremities.  Range of motion is full without contracture.  Motor exam is normal with grade 5 strength.  The joints are fully reduced without subluxation.  There is no atrophy or tremor and muscle tone is normal.  All joints are stable.  She is ambulating today no assistive devices.  She has lateral tenderness no joint effusion. Flexion equals 120. Strength is normal. Knee is stable. McMurray sign was positive for lateral knee pain. Jointline tenderness none on the medial side. Ligaments are stable   MRI report  Diagnosis #1 lateral meniscal tear Diagnoses #2 osteoarthritis with valgus deformity  Plan arthroscopy left knee with partial lateral meniscectomy and debridement  

## 2011-09-28 NOTE — Anesthesia Postprocedure Evaluation (Signed)
  Anesthesia Post-op Note  Patient: Lisa Parker  Procedure(s) Performed: Procedure(s) (LRB): ARTHROSCOPY KNEE (Left)  Patient Location: PACU  Anesthesia Type: General  Level of Consciousness: awake, alert , oriented and patient cooperative  Airway and Oxygen Therapy: Patient Spontanous Breathing and Patient connected to face mask oxygen  Post-op Pain: mild  Post-op Assessment: Post-op Vital signs reviewed, Patient's Cardiovascular Status Stable, Respiratory Function Stable, Patent Airway and No signs of Nausea or vomiting  Post-op Vital Signs: Reviewed and stable  Complications: No apparent anesthesia complications

## 2011-10-02 ENCOUNTER — Ambulatory Visit (INDEPENDENT_AMBULATORY_CARE_PROVIDER_SITE_OTHER): Payer: Medicare Other | Admitting: Orthopedic Surgery

## 2011-10-02 ENCOUNTER — Encounter: Payer: Self-pay | Admitting: Orthopedic Surgery

## 2011-10-02 VITALS — BP 100/66 | Ht 66.0 in | Wt 233.0 lb

## 2011-10-02 DIAGNOSIS — Z9889 Other specified postprocedural states: Secondary | ICD-10-CM | POA: Insufficient documentation

## 2011-10-02 NOTE — Progress Notes (Deleted)
Patient ID: Lisa Parker, female   DOB: May 01, 1946, 66 y.o.   MRN: [id number] Chief Complaint  Patient presents with  . Routine Post Op    POST OP 1 LEFT KNEE SARK 09/28/11    BP 100/66  Ht 5\' 6"  (1.676 m)  Wt 233 lb (105.688 kg)  BMI 37.61 kg/m2  Status post arthroscopy left knee

## 2011-10-02 NOTE — Progress Notes (Signed)
  Subjective:    Patient ID: Lisa Parker, female    DOB: May 22, 1945, 66 y.o.   MRN: [id number]  HPI Chief Complaint  Patient presents with  . Routine Post Op    POST OP 1 LEFT KNEE SALK 09/28/11   BP 100/66  Ht 5\' 6"  (1.676 m)  Wt 233 lb (105.688 kg)  BMI 37.61 kg/m2  Status post knee scope left knee operative findings grade 4 arthrosis especially in the lateral compartment degenerative lateral meniscal tear  Patient is doing well ambulating with a walker  Knee looks good minimal effusion. Patient weightbearing as tolerated.  Recommend physical therapy followup in 3 weeks   Review of Systems     Objective:   Physical Exam        Assessment & Plan:

## 2011-10-02 NOTE — Patient Instructions (Signed)
-   Start therapy

## 2011-10-04 ENCOUNTER — Ambulatory Visit: Payer: Medicare Other | Attending: Orthopedic Surgery | Admitting: Physical Therapy

## 2011-10-04 DIAGNOSIS — M25669 Stiffness of unspecified knee, not elsewhere classified: Secondary | ICD-10-CM | POA: Insufficient documentation

## 2011-10-04 DIAGNOSIS — R5381 Other malaise: Secondary | ICD-10-CM | POA: Insufficient documentation

## 2011-10-04 DIAGNOSIS — IMO0001 Reserved for inherently not codable concepts without codable children: Secondary | ICD-10-CM | POA: Insufficient documentation

## 2011-10-04 DIAGNOSIS — M25569 Pain in unspecified knee: Secondary | ICD-10-CM | POA: Insufficient documentation

## 2011-10-10 ENCOUNTER — Ambulatory Visit: Payer: Medicare Other | Attending: Orthopedic Surgery | Admitting: Physical Therapy

## 2011-10-10 DIAGNOSIS — IMO0001 Reserved for inherently not codable concepts without codable children: Secondary | ICD-10-CM | POA: Insufficient documentation

## 2011-10-10 DIAGNOSIS — M25669 Stiffness of unspecified knee, not elsewhere classified: Secondary | ICD-10-CM | POA: Insufficient documentation

## 2011-10-10 DIAGNOSIS — R5381 Other malaise: Secondary | ICD-10-CM | POA: Insufficient documentation

## 2011-10-10 DIAGNOSIS — M25569 Pain in unspecified knee: Secondary | ICD-10-CM | POA: Insufficient documentation

## 2011-10-17 ENCOUNTER — Ambulatory Visit: Payer: Medicare Other | Admitting: Physical Therapy

## 2011-10-24 ENCOUNTER — Ambulatory Visit (INDEPENDENT_AMBULATORY_CARE_PROVIDER_SITE_OTHER): Payer: Medicare Other | Admitting: Orthopedic Surgery

## 2011-10-24 VITALS — BP 110/72 | Ht 66.0 in | Wt 233.0 lb

## 2011-10-24 DIAGNOSIS — Z9889 Other specified postprocedural states: Secondary | ICD-10-CM

## 2011-10-24 DIAGNOSIS — S83289A Other tear of lateral meniscus, current injury, unspecified knee, initial encounter: Secondary | ICD-10-CM

## 2011-10-24 NOTE — Progress Notes (Signed)
Patient ID: Lisa Parker, female   DOB: 1945-10-28, 66 y.o.   MRN: [id number] BP 110/72  Ht 5\' 6"  (1.676 m)  Wt 105.688 kg (233 lb)  BMI 37.61 kg/m2 Chief Complaint  Patient presents with  . Follow-up    3 week recheck on left knee. DOS 09-28-11.     Postoperative visit status post arthroscopy, LEFT knee. We found significant amount of arthritis, but currently the patient is functioning pretty well. 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 We've advised exercise, knee sleeve as needed and cartilage supplementation.  She will make her next appointment. Depending on her symptoms.

## 2011-10-24 NOTE — Patient Instructions (Signed)
activities as tolerated 

## 2012-02-22 ENCOUNTER — Other Ambulatory Visit (HOSPITAL_COMMUNITY): Payer: Self-pay | Admitting: Family Medicine

## 2012-02-22 ENCOUNTER — Ambulatory Visit (HOSPITAL_COMMUNITY)
Admission: RE | Admit: 2012-02-22 | Discharge: 2012-02-22 | Disposition: A | Discharge status: Home / Self Care | Payer: Medicare Other | Source: Ambulatory Visit | Attending: Family Medicine | Admitting: Family Medicine

## 2012-02-22 DIAGNOSIS — J45909 Unspecified asthma, uncomplicated: Secondary | ICD-10-CM

## 2012-12-16 ENCOUNTER — Encounter: Payer: Self-pay | Admitting: Gastroenterology

## 2013-01-14 ENCOUNTER — Ambulatory Visit: Payer: Medicare Other | Admitting: Gastroenterology

## 2013-01-14 ENCOUNTER — Telehealth: Payer: Self-pay | Admitting: Gastroenterology

## 2013-01-14 NOTE — Telephone Encounter (Signed)
Please send letter.

## 2013-01-14 NOTE — Telephone Encounter (Signed)
Pt was a no show

## 2013-01-20 ENCOUNTER — Encounter: Payer: Self-pay | Admitting: Gastroenterology

## 2013-01-20 NOTE — Telephone Encounter (Signed)
Mailed letter °

## 2013-08-18 ENCOUNTER — Ambulatory Visit: Payer: Medicare Other | Admitting: Obstetrics & Gynecology

## 2013-09-01 ENCOUNTER — Ambulatory Visit: Payer: Medicare Other | Admitting: Obstetrics & Gynecology

## 2013-09-15 ENCOUNTER — Encounter: Payer: Self-pay | Admitting: Obstetrics & Gynecology

## 2013-09-15 ENCOUNTER — Other Ambulatory Visit: Payer: Self-pay | Admitting: Obstetrics & Gynecology

## 2013-09-15 ENCOUNTER — Ambulatory Visit (INDEPENDENT_AMBULATORY_CARE_PROVIDER_SITE_OTHER): Payer: Commercial Managed Care - HMO | Admitting: Obstetrics & Gynecology

## 2013-09-15 VITALS — BP 185/81 | HR 57 | Resp 16 | Ht 67.0 in | Wt 248.0 lb

## 2013-09-15 DIAGNOSIS — I10 Essential (primary) hypertension: Secondary | ICD-10-CM | POA: Insufficient documentation

## 2013-09-15 DIAGNOSIS — Z1231 Encounter for screening mammogram for malignant neoplasm of breast: Secondary | ICD-10-CM

## 2013-09-15 DIAGNOSIS — E669 Obesity, unspecified: Secondary | ICD-10-CM

## 2013-09-15 DIAGNOSIS — Z01419 Encounter for gynecological examination (general) (routine) without abnormal findings: Secondary | ICD-10-CM

## 2013-09-15 DIAGNOSIS — E039 Hypothyroidism, unspecified: Secondary | ICD-10-CM | POA: Insufficient documentation

## 2013-09-15 DIAGNOSIS — Z9189 Other specified personal risk factors, not elsewhere classified: Secondary | ICD-10-CM

## 2013-09-15 NOTE — Progress Notes (Signed)
Patient ID: Lisa Parker, female   DOB: 1946/02/28, 68 y.o.   MRN: [id number]  Chief Complaint  Patient presents with  . Gynecologic Exam    HPI Lisa Parker is a 68 y.o. female.  G1P1 20 years postmenopausal, last Gyn exam was 8 years ago, no h/o abnormal pap. Insurance recommended Gyn exam.   HPI  Past Medical History  Diagnosis Date  . HTN (hypertension)   . Hypothyroid   . Arthritis   . Asthma   . Seasonal allergies   . Urinary tract infection     Past Surgical History  Procedure Laterality Date  . Cesarean section    . Tonsillectomy and adenoidectomy    . Hernia repair  2004    Family History  Problem Relation Age of Onset  . Heart disease    . Asthma    . Cancer Father     Social History History  Substance Use Topics  . Smoking status: Former Smoker -- 1.00 packs/day for 4 years    Types: Cigarettes  . Smokeless tobacco: Former Systems developer    Quit date: 05/07/1967  . Alcohol Use: No    No Known Allergies  Current Outpatient Prescriptions  Medication Sig Dispense Refill  . bisoprolol-hydrochlorothiazide (ZIAC) 2.5-6.25 MG per tablet Take 1 tablet by mouth daily.       . budesonide-formoterol (SYMBICORT) 80-4.5 MCG/ACT inhaler Inhale 2 puffs into the lungs 2 (two) times daily.      . Cholecalciferol (VITAMIN D) 2000 UNITS tablet Take 2,000 Units by mouth daily.      Marland Kitchen escitalopram (LEXAPRO) 20 MG tablet Take 20 mg by mouth daily.       Marland Kitchen levothyroxine (SYNTHROID, LEVOTHROID) 175 MCG tablet Take 175 mcg by mouth daily.       . meloxicam (MOBIC) 15 MG tablet       . promethazine (PHENERGAN) 12.5 MG tablet Take 1 tablet (12.5 mg total) by mouth every 6 (six) hours as needed for nausea.  30 tablet  0   No current facility-administered medications for this visit.    Review of Systems Review of Systems  Constitutional: Negative.   Cardiovascular: Negative.   Gastrointestinal: Negative.   Genitourinary: Negative.     Blood pressure 185/81, pulse 57, resp.  rate 16, height 5\' 7"  (1.702 m), weight 248 lb (112.492 kg).  Physical Exam Physical Exam  Constitutional: She appears well-developed. No distress.  Cardiovascular: Normal rate.   Pulmonary/Chest: Effort normal and breath sounds normal.  Abdominal: She exhibits no distension and no mass. There is no tenderness.  Genitourinary: Vagina normal and uterus normal. No vaginal discharge found.  No mass. Pap not indicated by current guidelines  Skin: Skin is warm and dry.  Psychiatric: She has a normal mood and affect. Her behavior is normal.    Data Reviewed Problem list  Assessment    Normal well woman exam     Plan    Screening mammography  Recommend colonoscopy, weight loss, exercise        Woodroe Mode 09/15/2013, 9:54 AM

## 2013-09-15 NOTE — Patient Instructions (Signed)
Colonoscopy A colonoscopy is an exam to look at the entire large intestine (colon). This exam can help find problems such as tumors, polyps, inflammation, and areas of bleeding. The exam takes about 1 hour.  LET [facility] CARE PROVIDER KNOW ABOUT:   Any allergies you have.  All medicines you are taking, including vitamins, herbs, eye drops, creams, and over-the-counter medicines.  Previous problems you or members of your family have had with the use of anesthetics.  Any blood disorders you have.  Previous surgeries you have had.  Medical conditions you have. RISKS AND COMPLICATIONS  Generally, this is a safe procedure. However, as with any procedure, complications can occur. Possible complications include:  Bleeding.  Tearing or rupture of the colon wall.  Reaction to medicines given during the exam.  Infection (rare). BEFORE THE PROCEDURE   Ask your health care provider about changing or stopping your regular medicines.  You may be prescribed an oral bowel prep. This involves drinking a large amount of medicated liquid, starting the day before your procedure. The liquid will cause you to have multiple loose stools until your stool is almost clear or light green. This cleans out your colon in preparation for the procedure.  Do not eat or drink anything else once you have started the bowel prep, unless your health care provider tells you it is safe to do so.  Arrange for someone to drive you home after the procedure. PROCEDURE   You will be given medicine to help you relax (sedative).  You will lie on your side with your knees bent.  A long, flexible tube with a light and camera on the end (colonoscope) will be inserted through the rectum and into the colon. The camera sends video back to a computer screen as it moves through the colon. The colonoscope also releases carbon dioxide gas to inflate the colon. This helps your health care provider see the area better.  During  the exam, your health care provider may take a small tissue sample (biopsy) to be examined under a microscope if any abnormalities are found.  The exam is finished when the entire colon has been viewed. AFTER THE PROCEDURE   Do not drive for 24 hours after the exam.  You may have a small amount of blood in your stool.  You may pass moderate amounts of gas and have mild abdominal cramping or bloating. This is caused by the gas used to inflate your colon during the exam.  Ask when your test results will be ready and how you will get your results. Make sure you get your test results. Document Released: 04/20/2000 Document Revised: 02/11/2013 Document Reviewed: 12/29/2012 [facility] Patient Information 2014 [place]. Mammography Mammography is an X-ray of the breasts to look for changes that are not normal. The X-ray image is called a mammogram. This procedure can screen for breast cancer, can detect cancer early, and can diagnose cancer.  LET YOUR CAREGIVER KNOW ABOUT:  Breast implants.  Previous breast disease, biopsy, or surgery.  If you are breastfeeding.  Medicines taken, including vitamins, herbs, eyedrops, over-the-counter medicines, and creams.  Use of steroids (by mouth or creams).  Possibility of pregnancy, if this applies. RISKS AND COMPLICATIONS  Exposure to radiation, but at very low levels.  The results may be misinterpreted.  The results may not be accurate.  Mammography may lead to further tests.  Mammography may not catch certain cancers. BEFORE THE PROCEDURE  Schedule your test about 7 days after your  menstrual period. This is when your breasts are the least tender and have signs of hormone changes.  If you have had a mammography done at a different facility in the past, get the mammogram X-rays or have them sent to your current exam facility in order to compare them.  Wash your breasts and under your arms the day of the test.  Do not wear  deodorants, perfumes, or powders anywhere on your body.  Wear clothes that you can change in and out of easily. PROCEDURE Relax as much as possible during the test. Any discomfort during the test will be very brief. The test should take less than 30 minutes. The following will happen:  You will undress from the waist up and put on a gown.  You will stand in front of the X-ray machine.  Each breast will be placed between 2 plastic or glass plates. The plates will compress your breast for a few seconds.  X-rays will be taken from different angles of the breast. AFTER THE PROCEDURE  The mammogram will be examined.  Depending on the quality of the images, you may need to repeat certain parts of the test.  Ask when your test results will be ready. Make sure you get your test results.  You may resume normal activities. Document Released: 04/20/2000 Document Revised: 07/16/2011 Document Reviewed: 02/11/2011 [facility] Patient Information 2014 [place].

## 2013-09-24 ENCOUNTER — Ambulatory Visit (HOSPITAL_COMMUNITY)
Admission: RE | Admit: 2013-09-24 | Discharge: 2013-09-24 | Disposition: A | Discharge status: Home / Self Care | Payer: Medicare HMO | Source: Ambulatory Visit | Attending: Obstetrics & Gynecology | Admitting: Obstetrics & Gynecology

## 2013-09-24 ENCOUNTER — Ambulatory Visit (HOSPITAL_COMMUNITY): Payer: Commercial Managed Care - HMO

## 2013-09-24 DIAGNOSIS — Z1231 Encounter for screening mammogram for malignant neoplasm of breast: Secondary | ICD-10-CM | POA: Insufficient documentation

## 2014-03-08 ENCOUNTER — Encounter: Payer: Self-pay | Admitting: Obstetrics & Gynecology

## 2014-05-27 DIAGNOSIS — Z6841 Body Mass Index (BMI) 40.0 and over, adult: Secondary | ICD-10-CM | POA: Diagnosis not present

## 2014-05-27 DIAGNOSIS — K4090 Unilateral inguinal hernia, without obstruction or gangrene, not specified as recurrent: Secondary | ICD-10-CM | POA: Diagnosis not present

## 2014-07-08 DIAGNOSIS — I10 Essential (primary) hypertension: Secondary | ICD-10-CM | POA: Diagnosis not present

## 2014-07-08 DIAGNOSIS — Z6841 Body Mass Index (BMI) 40.0 and over, adult: Secondary | ICD-10-CM | POA: Diagnosis not present

## 2014-07-08 DIAGNOSIS — R7301 Impaired fasting glucose: Secondary | ICD-10-CM | POA: Diagnosis not present

## 2014-07-08 DIAGNOSIS — E063 Autoimmune thyroiditis: Secondary | ICD-10-CM | POA: Diagnosis not present

## 2014-07-19 DIAGNOSIS — Z6841 Body Mass Index (BMI) 40.0 and over, adult: Secondary | ICD-10-CM | POA: Diagnosis not present

## 2014-07-19 DIAGNOSIS — K219 Gastro-esophageal reflux disease without esophagitis: Secondary | ICD-10-CM | POA: Diagnosis not present

## 2014-07-19 DIAGNOSIS — M19079 Primary osteoarthritis, unspecified ankle and foot: Secondary | ICD-10-CM | POA: Diagnosis not present

## 2014-07-19 DIAGNOSIS — I10 Essential (primary) hypertension: Secondary | ICD-10-CM | POA: Diagnosis not present

## 2014-07-19 DIAGNOSIS — N393 Stress incontinence (female) (male): Secondary | ICD-10-CM | POA: Diagnosis not present

## 2014-07-27 DIAGNOSIS — N393 Stress incontinence (female) (male): Secondary | ICD-10-CM | POA: Diagnosis not present

## 2014-07-27 DIAGNOSIS — K219 Gastro-esophageal reflux disease without esophagitis: Secondary | ICD-10-CM | POA: Diagnosis not present

## 2014-07-27 DIAGNOSIS — M19079 Primary osteoarthritis, unspecified ankle and foot: Secondary | ICD-10-CM | POA: Diagnosis not present

## 2014-07-27 DIAGNOSIS — I10 Essential (primary) hypertension: Secondary | ICD-10-CM | POA: Diagnosis not present

## 2014-08-19 DIAGNOSIS — Z6839 Body mass index (BMI) 39.0-39.9, adult: Secondary | ICD-10-CM | POA: Diagnosis not present

## 2014-09-21 DIAGNOSIS — E782 Mixed hyperlipidemia: Secondary | ICD-10-CM | POA: Diagnosis not present

## 2014-09-21 DIAGNOSIS — Z6838 Body mass index (BMI) 38.0-38.9, adult: Secondary | ICD-10-CM | POA: Diagnosis not present

## 2014-09-21 DIAGNOSIS — I10 Essential (primary) hypertension: Secondary | ICD-10-CM | POA: Diagnosis not present

## 2014-09-21 DIAGNOSIS — J209 Acute bronchitis, unspecified: Secondary | ICD-10-CM | POA: Diagnosis not present

## 2014-11-01 DIAGNOSIS — E6609 Other obesity due to excess calories: Secondary | ICD-10-CM | POA: Diagnosis not present

## 2014-11-01 DIAGNOSIS — E669 Obesity, unspecified: Secondary | ICD-10-CM | POA: Diagnosis not present

## 2014-11-01 DIAGNOSIS — Z6836 Body mass index (BMI) 36.0-36.9, adult: Secondary | ICD-10-CM | POA: Diagnosis not present

## 2014-11-01 DIAGNOSIS — Z1389 Encounter for screening for other disorder: Secondary | ICD-10-CM | POA: Diagnosis not present

## 2014-11-30 DIAGNOSIS — Z713 Dietary counseling and surveillance: Secondary | ICD-10-CM | POA: Diagnosis not present

## 2014-12-02 DIAGNOSIS — Z6836 Body mass index (BMI) 36.0-36.9, adult: Secondary | ICD-10-CM | POA: Diagnosis not present

## 2014-12-02 DIAGNOSIS — E669 Obesity, unspecified: Secondary | ICD-10-CM | POA: Diagnosis not present

## 2014-12-02 DIAGNOSIS — Z1389 Encounter for screening for other disorder: Secondary | ICD-10-CM | POA: Diagnosis not present

## 2014-12-17 DIAGNOSIS — Z0000 Encounter for general adult medical examination without abnormal findings: Secondary | ICD-10-CM | POA: Diagnosis not present

## 2014-12-17 DIAGNOSIS — R9431 Abnormal electrocardiogram [ECG] [EKG]: Secondary | ICD-10-CM | POA: Diagnosis not present

## 2014-12-17 DIAGNOSIS — Z1389 Encounter for screening for other disorder: Secondary | ICD-10-CM | POA: Diagnosis not present

## 2014-12-17 DIAGNOSIS — Z6836 Body mass index (BMI) 36.0-36.9, adult: Secondary | ICD-10-CM | POA: Diagnosis not present

## 2014-12-17 DIAGNOSIS — E6609 Other obesity due to excess calories: Secondary | ICD-10-CM | POA: Diagnosis not present

## 2014-12-30 DIAGNOSIS — E669 Obesity, unspecified: Secondary | ICD-10-CM | POA: Diagnosis not present

## 2014-12-30 DIAGNOSIS — Z713 Dietary counseling and surveillance: Secondary | ICD-10-CM | POA: Diagnosis not present

## 2015-01-04 DIAGNOSIS — Z6836 Body mass index (BMI) 36.0-36.9, adult: Secondary | ICD-10-CM | POA: Diagnosis not present

## 2015-01-04 DIAGNOSIS — I10 Essential (primary) hypertension: Secondary | ICD-10-CM | POA: Diagnosis not present

## 2015-01-04 DIAGNOSIS — E039 Hypothyroidism, unspecified: Secondary | ICD-10-CM | POA: Diagnosis not present

## 2015-01-04 DIAGNOSIS — Z1389 Encounter for screening for other disorder: Secondary | ICD-10-CM | POA: Diagnosis not present

## 2015-01-04 DIAGNOSIS — E6609 Other obesity due to excess calories: Secondary | ICD-10-CM | POA: Diagnosis not present

## 2015-01-04 DIAGNOSIS — R7309 Other abnormal glucose: Secondary | ICD-10-CM | POA: Diagnosis not present

## 2015-01-25 DIAGNOSIS — Z713 Dietary counseling and surveillance: Secondary | ICD-10-CM | POA: Diagnosis not present

## 2015-02-04 DIAGNOSIS — Z1211 Encounter for screening for malignant neoplasm of colon: Secondary | ICD-10-CM | POA: Diagnosis not present

## 2015-02-21 DIAGNOSIS — I10 Essential (primary) hypertension: Secondary | ICD-10-CM | POA: Diagnosis not present

## 2015-02-21 DIAGNOSIS — M25569 Pain in unspecified knee: Secondary | ICD-10-CM | POA: Diagnosis not present

## 2015-02-21 DIAGNOSIS — Z6839 Body mass index (BMI) 39.0-39.9, adult: Secondary | ICD-10-CM | POA: Diagnosis not present

## 2015-02-21 DIAGNOSIS — J4530 Mild persistent asthma, uncomplicated: Secondary | ICD-10-CM | POA: Diagnosis not present

## 2015-02-21 DIAGNOSIS — F419 Anxiety disorder, unspecified: Secondary | ICD-10-CM | POA: Diagnosis not present

## 2015-02-21 DIAGNOSIS — E039 Hypothyroidism, unspecified: Secondary | ICD-10-CM | POA: Diagnosis not present

## 2015-02-21 DIAGNOSIS — G8929 Other chronic pain: Secondary | ICD-10-CM | POA: Diagnosis not present

## 2015-02-21 DIAGNOSIS — F329 Major depressive disorder, single episode, unspecified: Secondary | ICD-10-CM | POA: Diagnosis not present

## 2015-02-21 DIAGNOSIS — E669 Obesity, unspecified: Secondary | ICD-10-CM | POA: Diagnosis not present

## 2015-02-21 DIAGNOSIS — R001 Bradycardia, unspecified: Secondary | ICD-10-CM | POA: Diagnosis not present

## 2015-02-21 DIAGNOSIS — Z6841 Body Mass Index (BMI) 40.0 and over, adult: Secondary | ICD-10-CM | POA: Diagnosis not present

## 2015-02-21 DIAGNOSIS — Q248 Other specified congenital malformations of heart: Secondary | ICD-10-CM | POA: Diagnosis not present

## 2015-03-01 DIAGNOSIS — F419 Anxiety disorder, unspecified: Secondary | ICD-10-CM | POA: Diagnosis not present

## 2015-03-01 DIAGNOSIS — R32 Unspecified urinary incontinence: Secondary | ICD-10-CM | POA: Diagnosis not present

## 2015-03-01 DIAGNOSIS — J4530 Mild persistent asthma, uncomplicated: Secondary | ICD-10-CM | POA: Diagnosis not present

## 2015-03-01 DIAGNOSIS — F418 Other specified anxiety disorders: Secondary | ICD-10-CM | POA: Diagnosis not present

## 2015-03-01 DIAGNOSIS — F329 Major depressive disorder, single episode, unspecified: Secondary | ICD-10-CM | POA: Diagnosis not present

## 2015-03-01 DIAGNOSIS — I10 Essential (primary) hypertension: Secondary | ICD-10-CM | POA: Diagnosis not present

## 2015-03-01 DIAGNOSIS — E039 Hypothyroidism, unspecified: Secondary | ICD-10-CM | POA: Diagnosis not present

## 2015-03-01 DIAGNOSIS — G8929 Other chronic pain: Secondary | ICD-10-CM | POA: Diagnosis not present

## 2015-03-01 DIAGNOSIS — Z6841 Body Mass Index (BMI) 40.0 and over, adult: Secondary | ICD-10-CM | POA: Diagnosis not present

## 2015-03-01 DIAGNOSIS — M25569 Pain in unspecified knee: Secondary | ICD-10-CM | POA: Diagnosis not present

## 2015-03-02 DIAGNOSIS — Z6841 Body Mass Index (BMI) 40.0 and over, adult: Secondary | ICD-10-CM | POA: Diagnosis not present

## 2015-03-02 DIAGNOSIS — F419 Anxiety disorder, unspecified: Secondary | ICD-10-CM | POA: Diagnosis not present

## 2015-03-02 DIAGNOSIS — M25569 Pain in unspecified knee: Secondary | ICD-10-CM | POA: Diagnosis not present

## 2015-03-02 DIAGNOSIS — I10 Essential (primary) hypertension: Secondary | ICD-10-CM | POA: Diagnosis not present

## 2015-03-02 DIAGNOSIS — E039 Hypothyroidism, unspecified: Secondary | ICD-10-CM | POA: Diagnosis not present

## 2015-03-02 DIAGNOSIS — F329 Major depressive disorder, single episode, unspecified: Secondary | ICD-10-CM | POA: Diagnosis not present

## 2015-03-02 DIAGNOSIS — J4530 Mild persistent asthma, uncomplicated: Secondary | ICD-10-CM | POA: Diagnosis not present

## 2015-03-02 DIAGNOSIS — G8929 Other chronic pain: Secondary | ICD-10-CM | POA: Diagnosis not present

## 2015-03-24 ENCOUNTER — Encounter: Payer: Self-pay | Admitting: Gastroenterology

## 2015-03-29 DIAGNOSIS — Z1389 Encounter for screening for other disorder: Secondary | ICD-10-CM | POA: Diagnosis not present

## 2015-03-29 DIAGNOSIS — E039 Hypothyroidism, unspecified: Secondary | ICD-10-CM | POA: Diagnosis not present

## 2015-03-29 DIAGNOSIS — Z6833 Body mass index (BMI) 33.0-33.9, adult: Secondary | ICD-10-CM | POA: Diagnosis not present

## 2015-03-29 DIAGNOSIS — E6609 Other obesity due to excess calories: Secondary | ICD-10-CM | POA: Diagnosis not present

## 2015-03-29 DIAGNOSIS — Z9884 Bariatric surgery status: Secondary | ICD-10-CM | POA: Diagnosis not present

## 2015-04-04 DIAGNOSIS — Z713 Dietary counseling and surveillance: Secondary | ICD-10-CM | POA: Diagnosis not present

## 2015-04-13 ENCOUNTER — Ambulatory Visit: Payer: Commercial Managed Care - HMO | Admitting: Gastroenterology

## 2015-09-26 ENCOUNTER — Other Ambulatory Visit: Payer: Self-pay | Admitting: Orthopedic Surgery

## 2015-10-13 ENCOUNTER — Encounter (HOSPITAL_BASED_OUTPATIENT_CLINIC_OR_DEPARTMENT_OTHER): Payer: Self-pay | Admitting: *Deleted

## 2015-10-20 ENCOUNTER — Encounter (HOSPITAL_BASED_OUTPATIENT_CLINIC_OR_DEPARTMENT_OTHER): Payer: Self-pay | Admitting: *Deleted

## 2015-10-20 ENCOUNTER — Ambulatory Visit (HOSPITAL_BASED_OUTPATIENT_CLINIC_OR_DEPARTMENT_OTHER): Payer: Medicare HMO | Admitting: Certified Registered"

## 2015-10-20 ENCOUNTER — Ambulatory Visit (HOSPITAL_BASED_OUTPATIENT_CLINIC_OR_DEPARTMENT_OTHER)
Admission: RE | Admit: 2015-10-20 | Discharge: 2015-10-20 | Disposition: A | Discharge status: Home / Self Care | Payer: Medicare HMO | Source: Ambulatory Visit | Attending: Orthopedic Surgery | Admitting: Orthopedic Surgery

## 2015-10-20 ENCOUNTER — Encounter (HOSPITAL_BASED_OUTPATIENT_CLINIC_OR_DEPARTMENT_OTHER)
Admission: RE | Disposition: A | Discharge status: Home / Self Care | Payer: Self-pay | Source: Ambulatory Visit | Attending: Orthopedic Surgery

## 2015-10-20 DIAGNOSIS — I10 Essential (primary) hypertension: Secondary | ICD-10-CM | POA: Insufficient documentation

## 2015-10-20 DIAGNOSIS — M1990 Unspecified osteoarthritis, unspecified site: Secondary | ICD-10-CM | POA: Insufficient documentation

## 2015-10-20 DIAGNOSIS — Z79899 Other long term (current) drug therapy: Secondary | ICD-10-CM | POA: Diagnosis not present

## 2015-10-20 DIAGNOSIS — Z87891 Personal history of nicotine dependence: Secondary | ICD-10-CM | POA: Diagnosis not present

## 2015-10-20 DIAGNOSIS — J45909 Unspecified asthma, uncomplicated: Secondary | ICD-10-CM | POA: Diagnosis not present

## 2015-10-20 DIAGNOSIS — M67844 Other specified disorders of tendon, left hand: Secondary | ICD-10-CM | POA: Diagnosis present

## 2015-10-20 DIAGNOSIS — E039 Hypothyroidism, unspecified: Secondary | ICD-10-CM | POA: Insufficient documentation

## 2015-10-20 DIAGNOSIS — D481 Neoplasm of uncertain behavior of connective and other soft tissue: Secondary | ICD-10-CM | POA: Insufficient documentation

## 2015-10-20 HISTORY — PX: MASS EXCISION: SHX2000

## 2015-10-20 LAB — POCT I-STAT, CHEM 8
BUN: 22 mg/dL — AB (ref 6–20)
CREATININE: 0.6 mg/dL (ref 0.44–1.00)
Calcium, Ion: 1.18 mmol/L (ref 1.13–1.30)
Chloride: 102 mmol/L (ref 101–111)
GLUCOSE: 83 mg/dL (ref 65–99)
HEMATOCRIT: 43 % (ref 36.0–46.0)
Hemoglobin: 14.6 g/dL (ref 12.0–15.0)
POTASSIUM: 3.7 mmol/L (ref 3.5–5.1)
Sodium: 141 mmol/L (ref 135–145)
TCO2: 28 mmol/L (ref 0–100)

## 2015-10-20 SURGERY — EXCISION MASS
Anesthesia: General | Site: Hand | Laterality: Left

## 2015-10-20 MED ORDER — ONDANSETRON HCL 4 MG/2ML IJ SOLN
INTRAMUSCULAR | Status: AC
  Filled 2015-10-20: qty 2

## 2015-10-20 MED ORDER — GLYCOPYRROLATE 0.2 MG/ML IJ SOLN
0.2000 mg | Freq: Once | INTRAMUSCULAR | Status: AC | PRN
  Administered 2015-10-20: .2 mg via INTRAVENOUS

## 2015-10-20 MED ORDER — PROPOFOL 10 MG/ML IV BOLUS
INTRAVENOUS | Status: AC
  Filled 2015-10-20: qty 20

## 2015-10-20 MED ORDER — SCOPOLAMINE 1 MG/3DAYS TD PT72: 1.0000 | MEDICATED_PATCH | Freq: Once | TRANSDERMAL | Status: DC | PRN

## 2015-10-20 MED ORDER — FENTANYL CITRATE (PF) 100 MCG/2ML IJ SOLN
INTRAMUSCULAR | Status: AC
  Filled 2015-10-20: qty 2

## 2015-10-20 MED ORDER — MIDAZOLAM HCL 2 MG/2ML IJ SOLN: 1.0000 mg | INTRAMUSCULAR | Status: DC | PRN

## 2015-10-20 MED ORDER — PROPOFOL 10 MG/ML IV BOLUS
INTRAVENOUS | Status: DC | PRN
  Administered 2015-10-20: 120 mg via INTRAVENOUS

## 2015-10-20 MED ORDER — ONDANSETRON HCL 4 MG/2ML IJ SOLN
INTRAMUSCULAR | Status: DC | PRN
  Administered 2015-10-20: 4 mg via INTRAVENOUS

## 2015-10-20 MED ORDER — OXYCODONE HCL 5 MG/5ML PO SOLN: 0.1000 mg/kg | Freq: Once | ORAL | Status: DC | PRN

## 2015-10-20 MED ORDER — BUPIVACAINE HCL (PF) 0.25 % IJ SOLN
INTRAMUSCULAR | Status: DC | PRN
  Administered 2015-10-20: 6 mL

## 2015-10-20 MED ORDER — DEXAMETHASONE SODIUM PHOSPHATE 10 MG/ML IJ SOLN
INTRAMUSCULAR | Status: AC
  Filled 2015-10-20: qty 1

## 2015-10-20 MED ORDER — HYDROCODONE-ACETAMINOPHEN 5-325 MG PO TABS: ORAL_TABLET | ORAL | Status: DC

## 2015-10-20 MED ORDER — CEFAZOLIN SODIUM-DEXTROSE 2-4 GM/100ML-% IV SOLN
INTRAVENOUS | Status: AC
  Filled 2015-10-20: qty 100

## 2015-10-20 MED ORDER — LIDOCAINE 2% (20 MG/ML) 5 ML SYRINGE
INTRAMUSCULAR | Status: AC
  Filled 2015-10-20: qty 5

## 2015-10-20 MED ORDER — DEXAMETHASONE SODIUM PHOSPHATE 10 MG/ML IJ SOLN
INTRAMUSCULAR | Status: DC | PRN
  Administered 2015-10-20: 10 mg via INTRAVENOUS

## 2015-10-20 MED ORDER — LIDOCAINE HCL (CARDIAC) 20 MG/ML IV SOLN
INTRAVENOUS | Status: DC | PRN
  Administered 2015-10-20: 50 mg via INTRAVENOUS

## 2015-10-20 MED ORDER — GLYCOPYRROLATE 0.2 MG/ML IV SOSY
PREFILLED_SYRINGE | INTRAVENOUS | Status: AC
  Filled 2015-10-20: qty 3

## 2015-10-20 MED ORDER — CHLORHEXIDINE GLUCONATE 4 % EX LIQD: 60.0000 mL | Freq: Once | CUTANEOUS | Status: DC

## 2015-10-20 MED ORDER — CEFAZOLIN SODIUM-DEXTROSE 2-4 GM/100ML-% IV SOLN
2.0000 g | INTRAVENOUS | Status: AC
  Administered 2015-10-20: 2 g via INTRAVENOUS

## 2015-10-20 MED ORDER — ONDANSETRON HCL 4 MG/2ML IJ SOLN: 4.0000 mg | Freq: Once | INTRAMUSCULAR | Status: DC | PRN

## 2015-10-20 MED ORDER — LACTATED RINGERS IV SOLN
INTRAVENOUS | Status: DC
  Administered 2015-10-20 (×2): via INTRAVENOUS

## 2015-10-20 MED ORDER — ATROPINE SULFATE 0.4 MG/ML IJ SOLN
INTRAMUSCULAR | Status: DC | PRN
  Administered 2015-10-20: 0.4 mg via INTRAVENOUS

## 2015-10-20 MED ORDER — MORPHINE SULFATE (PF) 4 MG/ML IV SOLN: 0.0500 mg/kg | INTRAVENOUS | Status: DC | PRN

## 2015-10-20 MED ORDER — FENTANYL CITRATE (PF) 100 MCG/2ML IJ SOLN
50.0000 ug | INTRAMUSCULAR | Status: AC | PRN
  Administered 2015-10-20: 50 ug via INTRAVENOUS
  Administered 2015-10-20 (×2): 25 ug via INTRAVENOUS

## 2015-10-20 MED ORDER — ATROPINE SULFATE 0.4 MG/ML IJ SOLN
INTRAMUSCULAR | Status: AC
  Filled 2015-10-20: qty 1

## 2015-10-20 SURGICAL SUPPLY — 54 items
APL SKNCLS STERI-STRIP NONHPOA (GAUZE/BANDAGES/DRESSINGS)
BANDAGE ACE 3X5.8 VEL STRL LF (GAUZE/BANDAGES/DRESSINGS) IMPLANT
BANDAGE COBAN STERILE 2 (GAUZE/BANDAGES/DRESSINGS) IMPLANT
BENZOIN TINCTURE PRP APPL 2/3 (GAUZE/BANDAGES/DRESSINGS) IMPLANT
BLADE MINI RND TIP GREEN BEAV (BLADE) IMPLANT
BLADE SURG 15 STRL LF DISP TIS (BLADE) ×2 IMPLANT
BLADE SURG 15 STRL SS (BLADE) ×6
BNDG CMPR 9X4 STRL LF SNTH (GAUZE/BANDAGES/DRESSINGS) ×1
BNDG COHESIVE 1X5 TAN STRL LF (GAUZE/BANDAGES/DRESSINGS) ×2 IMPLANT
BNDG CONFORM 2 STRL LF (GAUZE/BANDAGES/DRESSINGS) IMPLANT
BNDG ELASTIC 2X5.8 VLCR STR LF (GAUZE/BANDAGES/DRESSINGS) IMPLANT
BNDG ESMARK 4X9 LF (GAUZE/BANDAGES/DRESSINGS) ×2 IMPLANT
BNDG GAUZE 1X2.1 STRL (MISCELLANEOUS) IMPLANT
BNDG GAUZE ELAST 4 BULKY (GAUZE/BANDAGES/DRESSINGS) IMPLANT
BNDG PLASTER X FAST 3X3 WHT LF (CAST SUPPLIES) IMPLANT
BNDG PLSTR 9X3 FST ST WHT (CAST SUPPLIES)
CHLORAPREP W/TINT 26ML (MISCELLANEOUS) ×3 IMPLANT
CLOSURE WOUND 1/2 X4 (GAUZE/BANDAGES/DRESSINGS)
CORDS BIPOLAR (ELECTRODE) ×3 IMPLANT
COVER BACK TABLE 60X90IN (DRAPES) ×3 IMPLANT
COVER MAYO STAND STRL (DRAPES) ×3 IMPLANT
CUFF TOURNIQUET SINGLE 18IN (TOURNIQUET CUFF) ×3 IMPLANT
DRAPE EXTREMITY T 121X128X90 (DRAPE) ×3 IMPLANT
DRAPE SURG 17X23 STRL (DRAPES) ×3 IMPLANT
GAUZE SPONGE 4X4 12PLY STRL (GAUZE/BANDAGES/DRESSINGS) ×3 IMPLANT
GAUZE XEROFORM 1X8 LF (GAUZE/BANDAGES/DRESSINGS) ×3 IMPLANT
GLOVE BIO SURGEON STRL SZ7 (GLOVE) ×2 IMPLANT
GLOVE BIO SURGEON STRL SZ7.5 (GLOVE) ×5 IMPLANT
GLOVE BIOGEL PI IND STRL 8 (GLOVE) ×1 IMPLANT
GLOVE BIOGEL PI INDICATOR 8 (GLOVE) ×4
GOWN STRL REUS W/ TWL LRG LVL3 (GOWN DISPOSABLE) ×1 IMPLANT
GOWN STRL REUS W/TWL LRG LVL3 (GOWN DISPOSABLE) ×3
GOWN STRL REUS W/TWL XL LVL3 (GOWN DISPOSABLE) ×5 IMPLANT
NDL HYPO 25X1 1.5 SAFETY (NEEDLE) ×1 IMPLANT
NEEDLE HYPO 25X1 1.5 SAFETY (NEEDLE) ×3 IMPLANT
NS IRRIG 1000ML POUR BTL (IV SOLUTION) ×3 IMPLANT
PACK BASIN DAY SURGERY FS (CUSTOM PROCEDURE TRAY) ×3 IMPLANT
PAD CAST 3X4 CTTN HI CHSV (CAST SUPPLIES) IMPLANT
PAD CAST 4YDX4 CTTN HI CHSV (CAST SUPPLIES) IMPLANT
PADDING CAST ABS 4INX4YD NS (CAST SUPPLIES) ×2
PADDING CAST ABS COTTON 4X4 ST (CAST SUPPLIES) ×1 IMPLANT
PADDING CAST COTTON 3X4 STRL (CAST SUPPLIES)
PADDING CAST COTTON 4X4 STRL (CAST SUPPLIES)
STOCKINETTE 4X48 STRL (DRAPES) ×3 IMPLANT
STRIP CLOSURE SKIN 1/2X4 (GAUZE/BANDAGES/DRESSINGS) IMPLANT
SUT ETHILON 3 0 PS 1 (SUTURE) IMPLANT
SUT ETHILON 4 0 PS 2 18 (SUTURE) ×3 IMPLANT
SUT ETHILON 5 0 P 3 18 (SUTURE)
SUT NYLON ETHILON 5-0 P-3 1X18 (SUTURE) IMPLANT
SUT VIC AB 4-0 P2 18 (SUTURE) IMPLANT
SYR BULB 3OZ (MISCELLANEOUS) ×3 IMPLANT
SYR CONTROL 10ML LL (SYRINGE) ×3 IMPLANT
TOWEL OR 17X24 6PK STRL BLUE (TOWEL DISPOSABLE) ×6 IMPLANT
UNDERPAD 30X30 (UNDERPADS AND DIAPERS) ×3 IMPLANT

## 2015-10-20 NOTE — Op Note (Signed)
NAMERogelio Seen NO.:  [id number]  MEDICAL RECORD NO.:  [id number]  LOCATION:                                 FACILITY:  PHYSICIAN:  Leanora Cover, MD             DATE OF BIRTH:  DATE OF PROCEDURE:  10/20/2015 DATE OF DISCHARGE:                              OPERATIVE REPORT   PREOPERATIVE DIAGNOSIS:  Left small finger mass.  POSTOPERATIVE DIAGNOSIS:  Left small finger giant cell tumor.  PROCEDURE:  Excision of mass, left small finger, deep to the fascia and flexor tendon.  The mass measures 17 mm x 14 mm x 7 mm.  SURGEON:  Leanora Cover, MD.  ASSISTANT:  None.  ANESTHESIA:  General.  IV FLUIDS:  Anesthesia flow sheet.  ESTIMATED BLOOD LOSS:  Minimal.  COMPLICATIONS:  None.  SPECIMENS:  Mass to Pathology.  TOURNIQUET TIME:  29 minutes.  DISPOSITION:  Stable to PACU.  INDICATIONS:  The patient is a 70 year old female who has noted a mass in the left small finger for several months.  This is bothersome to her. She wished to have it removed.  It has been enlarging.  Risks, benefits, and alternatives of surgery were discussed including risk of blood loss; infection; damage to nerves, vessels, tendons, ligaments, bone; failure of surgery; need for additional surgery; complications with wound healing, continued pain, recurrence of mass.  She voiced understanding of these risks and elected to proceed.  OPERATIVE COURSE:  After being identified preoperatively by myself, the patient and I agreed upon the procedure and site of the procedure. Surgical site was marked.  Risks, benefits, and alternatives of surgery were reviewed; and she wished to proceed.  Surgical consent had been signed.  She was given IV Ancef as preoperative antibiotic prophylaxis. She was transferred to the operating room, placed on the operating table in supine position with the left upper extremity on arm board.  General anesthesia was induced by Anesthesiology.  Left upper  extremity was prepped and draped in normal sterile orthopedic fashion.  Surgical pause was performed between surgeons, Anesthesia, and operating room staff; and all were in agreement as to the patient, procedure, and site of procedure.  Tourniquet at the proximal aspect of the extremity was inflated to 250 mmHg after exsanguination of the limb with Esmarch bandage.  A Brunner type incision was made over the mass in the small finger.  This was carried into subcutaneous tissues by spreading technique.  Bipolar electrocautery was used to obtain hemostasis.  The radial digital nerve and artery were over top of the mass.  These were carefully freed up and protected throughout the case.  The mass was brownish and firm in nature.  It was carefully freed up from its soft tissue attachments.  It was traced underneath the flexor tendon at the level of the DIP joint.  There was also an area going more dorsally. These were all removed.  The undersurface of the flexor tendon was examined from both the radial and ulnar sides, and all mass appeared to be removed.  The wound was  explored.  No more of any mass was noted. The mass was measured 17 mm x 14 mm x 7 mm.  He was sent to Pathology for examination.  The wound was copiously irrigated with sterile saline. It was closed with 4-0 nylon in a horizontal mattress fashion.  A digital block was performed with 6 mL of 0.25% plain Marcaine to aid in postoperative anesthesia.  It was then dressed with sterile Xeroform, 4x4s, and wrapped with a Coban dressing lightly.  Alumafoam splint was placed and wrapped lightly with a Coban dressing.  Tourniquet was deflated at 29 minutes.  Fingertips were pink with brisk capillary refill after deflation of the tourniquet.  Operative drapes were broken down.  The patient was awoken from anesthesia safely.  She was transferred back to a stretcher and taken to PACU in stable condition. I will give her Norco 5/325, 1-2 p.o.  q.6 hours p.r.n. pain, dispensed #20.     Leanora Cover, MD     KK/MEDQ  D:  10/20/2015  T:  10/20/2015  Job:  [id number]

## 2015-10-20 NOTE — Anesthesia Preprocedure Evaluation (Signed)
Anesthesia Evaluation  Patient identified by MRN, date of birth, ID band Patient awake    Reviewed: Allergy & Precautions, NPO status , Patient's Chart, lab work & pertinent test results  Airway Mallampati: I  TM Distance: >3 FB Neck ROM: Full    Dental  (+) Partial Upper, Dental Advisory Given   Pulmonary asthma , former smoker,    breath sounds clear to auscultation       Cardiovascular hypertension, Pt. on medications and Pt. on home beta blockers  Rhythm:Regular Rate:Normal     Neuro/Psych    GI/Hepatic   Endo/Other  Hypothyroidism   Renal/GU      Musculoskeletal   Abdominal   Peds  Hematology   Anesthesia Other Findings   Reproductive/Obstetrics                             Anesthesia Physical Anesthesia Plan  ASA: III  Anesthesia Plan: General   Post-op Pain Management:    Induction: Intravenous  Airway Management Planned: LMA  Additional Equipment:   Intra-op Plan:   Post-operative Plan: Extubation in OR  Informed Consent: I have reviewed the patients History and Physical, chart, labs and discussed the procedure including the risks, benefits and alternatives for the proposed anesthesia with the patient or authorized representative who has indicated his/her understanding and acceptance.   Dental advisory given  Plan Discussed with: CRNA, Anesthesiologist and Surgeon  Anesthesia Plan Comments:         Anesthesia Quick Evaluation

## 2015-10-20 NOTE — Anesthesia Procedure Notes (Signed)
Procedure Name: LMA Insertion Date/Time: 10/20/2015 12:00 PM Performed by: Baxter Flattery Pre-anesthesia Checklist: Patient identified, Emergency Drugs available, Suction available and Patient being monitored Patient Re-evaluated:Patient Re-evaluated prior to inductionOxygen Delivery Method: Circle system utilized Preoxygenation: Pre-oxygenation with 100% oxygen Intubation Type: IV induction Ventilation: Mask ventilation without difficulty LMA: LMA inserted LMA Size: 4.0 Number of attempts: 1 Airway Equipment and Method: Bite block Placement Confirmation: positive ETCO2 and breath sounds checked- equal and bilateral Tube secured with: Tape Dental Injury: Teeth and Oropharynx as per pre-operative assessment

## 2015-10-20 NOTE — Brief Op Note (Signed)
10/20/2015  12:40 PM  PATIENT:  Erie Noe Treu  70 y.o. female  PRE-OPERATIVE DIAGNOSIS:  MASS VOLAR RADIAL ASPECT LEFT SMALL FINGER  POST-OPERATIVE DIAGNOSIS:  MASS VOLAR RADIAL ASPECT LEFT SMALL FINGER  PROCEDURE:  Procedure(s): EXCISION MASS LEFT SMALL FINGER (Left)  SURGEON:  Surgeon(s) and Role:    * Leanora Cover, MD - Primary  PHYSICIAN ASSISTANT:   ASSISTANTS: none   ANESTHESIA:   general  EBL:  Total I/O In: 1000 [I.V.:1000] Out: -   BLOOD ADMINISTERED:none  DRAINS: none   LOCAL MEDICATIONS USED:  MARCAINE     SPECIMEN:  Source of Specimen:  left small finger  DISPOSITION OF SPECIMEN:  PATHOLOGY  COUNTS:  YES  TOURNIQUET:   Total Tourniquet Time Documented: Upper Arm (Left) - 29 minutes Total: Upper Arm (Left) - 29 minutes   DICTATION: .Other Dictation: Dictation Number [id number]  PLAN OF CARE: Discharge to home after PACU  PATIENT DISPOSITION:  PACU - hemodynamically stable.

## 2015-10-20 NOTE — H&P (Signed)
  Lisa Parker is an 70 y.o. female.   Chief Complaint: left small finger mass HPI: 70 yo rhd female with mass on left small finger x 6 months.  This has been enlarging and is bothersome to her.  She wishes to have it removed.  Allergies: No Known Allergies  Past Medical History  Diagnosis Date  . HTN (hypertension)   . Hypothyroid   . Arthritis   . Asthma   . Seasonal allergies   . Urinary tract infection     Past Surgical History  Procedure Laterality Date  . Cesarean section    . Tonsillectomy and adenoidectomy    . Hernia repair  2004  . Laparoscopic gastric sleeve resection      Family History: Family History  Problem Relation Age of Onset  . Heart disease    . Asthma    . Cancer Father     Social History:   reports that she has quit smoking. Her smoking use included Cigarettes. She smoked 0.00 packs per day for 0 years. She quit smokeless tobacco use about 48 years ago. She reports that she does not drink alcohol or use illicit drugs.  Medications: Medications Prior to Admission  Medication Sig Dispense Refill  . bisoprolol-hydrochlorothiazide (ZIAC) 2.5-6.25 MG per tablet Take 1 tablet by mouth daily.     . budesonide-formoterol (SYMBICORT) 80-4.5 MCG/ACT inhaler Inhale 2 puffs into the lungs 2 (two) times daily.    . Cholecalciferol (VITAMIN D) 2000 UNITS tablet Take 2,000 Units by mouth daily.    Marland Kitchen escitalopram (LEXAPRO) 20 MG tablet Take 20 mg by mouth daily.     Marland Kitchen levothyroxine (SYNTHROID, LEVOTHROID) 175 MCG tablet Take 175 mcg by mouth daily.     . meloxicam (MOBIC) 15 MG tablet       Results for orders placed or performed during the hospital encounter of 10/20/15 (from the past 48 hour(s))  I-STAT, chem 8     Status: Abnormal   Collection Time: 10/20/15 10:26 AM  Result Value Ref Range   Sodium 141 135 - 145 mmol/L   Potassium 3.7 3.5 - 5.1 mmol/L   Chloride 102 101 - 111 mmol/L   BUN 22 (H) 6 - 20 mg/dL   Creatinine, Ser 0.60 0.44 - 1.00 mg/dL    Glucose, Bld 83 65 - 99 mg/dL   Calcium, Ion 1.18 1.13 - 1.30 mmol/L   TCO2 28 0 - 100 mmol/L   Hemoglobin 14.6 12.0 - 15.0 g/dL   HCT 43.0 36.0 - 46.0 %    No results found.   A comprehensive review of systems was negative.  Blood pressure 143/62, pulse 49, temperature 97.6 F (36.4 C), temperature source Oral, resp. rate 18, height 5\' 6"  (1.676 m), weight 73.483 kg (162 lb), SpO2 97 %.  General appearance: alert, cooperative and appears stated age Head: Normocephalic, without obvious abnormality, atraumatic Neck: supple, symmetrical, trachea midline Resp: clear to auscultation bilaterally Cardio: regular rate and rhythm GI: non-tender Extremities: Intact sensation and capillary refill all digits.  +epl/fpl/io.  No wounds.  Pulses: 2+ and symmetric Skin: Skin color, texture, turgor normal. No rashes or lesions Neurologic: Grossly normal Incision/Wound:none  Assessment/Plan Left small finger mass.  Non operative and operative treatment options were discussed with the patient and patient wishes to proceed with operative treatment. Risks, benefits, and alternatives of surgery were discussed and the patient agrees with the plan of care.   Marina Boerner R 10/20/2015, 11:38 AM

## 2015-10-20 NOTE — Discharge Instructions (Addendum)

## 2015-10-20 NOTE — Anesthesia Postprocedure Evaluation (Signed)
Anesthesia Post Note  Patient: Lisa Parker  Procedure(s) Performed: Procedure(s) (LRB): EXCISION MASS LEFT SMALL FINGER (Left)  Patient location during evaluation: PACU Anesthesia Type: General Level of consciousness: awake and alert Pain management: pain level controlled Vital Signs Assessment: post-procedure vital signs reviewed and stable Respiratory status: spontaneous breathing, nonlabored ventilation and respiratory function stable Cardiovascular status: blood pressure returned to baseline and stable Postop Assessment: no signs of nausea or vomiting Anesthetic complications: no    Last Vitals:  Filed Vitals:   10/20/15 1315 10/20/15 1330  BP: 150/62 99/84  Pulse: 62 60  Temp:    Resp: 17 12    Last Pain:  Filed Vitals:   10/20/15 1335  PainSc: 0-No pain                 Aser Nylund A

## 2015-10-20 NOTE — Transfer of Care (Signed)
Immediate Anesthesia Transfer of Care Note  Patient: Lisa Parker  Procedure(s) Performed: Procedure(s): EXCISION MASS LEFT SMALL FINGER (Left)  Patient Location: PACU  Anesthesia Type:General  Level of Consciousness: awake, alert  and patient cooperative  Airway & Oxygen Therapy: Patient Spontanous Breathing and Patient connected to face mask oxygen  Post-op Assessment: Report given to RN, Post -op Vital signs reviewed and stable and Patient moving all extremities  Post vital signs: Reviewed and stable  Last Vitals:  Filed Vitals:   10/20/15 0955  BP: 143/62  Pulse: 49  Temp: 36.4 C  Resp: 18    Last Pain: There were no vitals filed for this visit.       Complications: No apparent anesthesia complications

## 2015-10-20 NOTE — Op Note (Signed)
[id number] 

## 2015-10-21 ENCOUNTER — Encounter (HOSPITAL_BASED_OUTPATIENT_CLINIC_OR_DEPARTMENT_OTHER): Payer: Self-pay | Admitting: Orthopedic Surgery

## 2015-12-01 ENCOUNTER — Other Ambulatory Visit (HOSPITAL_COMMUNITY): Payer: Self-pay | Admitting: Family Medicine

## 2015-12-01 DIAGNOSIS — Z1231 Encounter for screening mammogram for malignant neoplasm of breast: Secondary | ICD-10-CM

## 2015-12-09 ENCOUNTER — Ambulatory Visit (HOSPITAL_COMMUNITY)
Admission: RE | Admit: 2015-12-09 | Discharge: 2015-12-09 | Disposition: A | Discharge status: Home / Self Care | Payer: Medicare HMO | Source: Ambulatory Visit | Attending: Family Medicine | Admitting: Family Medicine

## 2015-12-09 DIAGNOSIS — Z1231 Encounter for screening mammogram for malignant neoplasm of breast: Secondary | ICD-10-CM

## 2016-05-28 DIAGNOSIS — Z903 Acquired absence of stomach [part of]: Secondary | ICD-10-CM | POA: Diagnosis not present

## 2016-09-10 DIAGNOSIS — Z9884 Bariatric surgery status: Secondary | ICD-10-CM | POA: Diagnosis not present

## 2016-09-10 DIAGNOSIS — E039 Hypothyroidism, unspecified: Secondary | ICD-10-CM | POA: Diagnosis not present

## 2016-09-10 DIAGNOSIS — Z0000 Encounter for general adult medical examination without abnormal findings: Secondary | ICD-10-CM | POA: Diagnosis not present

## 2016-09-10 DIAGNOSIS — Z1389 Encounter for screening for other disorder: Secondary | ICD-10-CM | POA: Diagnosis not present

## 2016-10-08 DIAGNOSIS — Z780 Asymptomatic menopausal state: Secondary | ICD-10-CM | POA: Diagnosis not present

## 2016-10-09 ENCOUNTER — Ambulatory Visit (INDEPENDENT_AMBULATORY_CARE_PROVIDER_SITE_OTHER): Payer: Medicare Other | Admitting: Neurology

## 2016-10-09 ENCOUNTER — Encounter: Payer: Self-pay | Admitting: Neurology

## 2016-10-09 ENCOUNTER — Encounter (INDEPENDENT_AMBULATORY_CARE_PROVIDER_SITE_OTHER): Payer: Self-pay

## 2016-10-09 DIAGNOSIS — G3184 Mild cognitive impairment, so stated: Secondary | ICD-10-CM | POA: Diagnosis not present

## 2016-10-09 NOTE — Progress Notes (Signed)
PATIENT: Lisa Parker DOB: 1945/10/21  Chief Complaint  Patient presents with  . Memory Loss    MMSE 29/30 - 19 animals.  She is here for further evaluation of her declining memory and forgetfulness.  She is especially concerned because her mother and maternal aunt were diagnosed with Alzheimer's Disease.  Marland Kitchen PCP    Lisa Sites, MD     Lisa Parker is a 71 years old right-handed female, seen in refer by her primary care doctor  Lisa Parker for evaluation of memory loss, initial evaluation was on October 09 2016.    I reviewed and summarized the referring note,she has history of hypothyroidism, on supplement, anxiety, she was recently changed from citalopram 40 mg to current escitalopram 20 mg since early May 2018, for worsening anxiety symptoms.  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  Mother had gradual onset memory loss in her eighties, now at age 41, she suffered significant memory loss, cognitive malfunction, to the point of needing help feeding, no longer ambulatory. Her maternal aunt died of Alzheimer's disease at age 70, her younger brother at age 63 also suffered some memory loss.  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 Her husband was recently diagnosed with Parkinson's disease.  I reviewed laboratory evaluation from [facility] in October 2017, elevated ferritin 388,   REVIEW OF SYSTEMS: Full 14 system review of systems performed and notable only for easy bruise, joint swelling, allergy, anxiety, memory loss, ringing  ears  ALLERGIES: Allergies  Allergen Reactions  . Mold Extract  [Trichophyton]     Other reaction(s): Wheezing (ALLERGY/intolerance)  . Pollen Extract     Other reaction(s): Wheezing (ALLERGY/intolerance)  . Cucumber Extract Swelling  . Other     Melon and Walnuts - swelling    HOME MEDICATIONS: Current Outpatient Prescriptions  Medication Sig Dispense Refill  . ALPRAZolam (XANAX) 0.5 MG tablet Take 0.5 mg by mouth at bedtime.    . bisoprolol-hydrochlorothiazide (ZIAC) 2.5-6.25 MG per tablet Take 1 tablet by mouth daily.     . budesonide-formoterol (SYMBICORT) 80-4.5 MCG/ACT inhaler Inhale 2 puffs into the lungs 2 (two) times daily.    . Cholecalciferol (VITAMIN D) 2000 UNITS tablet Take 2,000 Units by mouth daily.    Marland Kitchen escitalopram (LEXAPRO) 20 MG tablet Take 20 mg by mouth daily.     Marland Kitchen levothyroxine (SYNTHROID, LEVOTHROID) 175 MCG tablet Take 175 mcg by mouth daily.     . meloxicam (MOBIC) 15 MG tablet      No current facility-administered medications for this visit.     PAST MEDICAL HISTORY: Past Medical History:  Diagnosis Date  . Anxiety   . Arthritis   . Asthma   . HTN (hypertension)   . Hypothyroid   . Memory loss   . Seasonal allergies   . Urinary tract infection     PAST SURGICAL HISTORY: Past Surgical History:  Procedure Laterality Date  . CATARACT EXTRACTION, BILATERAL    . CESAREAN SECTION    . FINGER SURGERY     left pinky  .  HERNIA REPAIR  2004  . KNEE SURGERY Left   . LAPAROSCOPIC GASTRIC SLEEVE RESECTION    . MASS EXCISION Left 10/20/2015   Procedure: EXCISION MASS LEFT SMALL FINGER;  Surgeon: Lisa Cover, MD;  Location: [place];  Service: Orthopedics;  Laterality: Left;  . TONSILLECTOMY AND ADENOIDECTOMY      FAMILY HISTORY: Family History  Problem Relation Age of Onset  . Alzheimer's disease Mother   . Lung cancer Father   . Heart disease Unknown   . Asthma Unknown   . Alzheimer's disease Maternal Aunt     SOCIAL  HISTORY:  Social History   Social History  . Marital status: Married    Spouse name: N/A  . Number of children: 1  . Years of education: some college   Occupational History  . farm work - retired    Social History Main Topics  . Smoking status: Former Smoker    Packs/day: 0.00    Years: 0.00    Types: Cigarettes  . Smokeless tobacco: Former Systems developer    Quit date: 05/07/1967  . Alcohol use No  . Drug use: No  . Sexual activity: Not on file   Other Topics Concern  . Not on file   Social History Narrative   Lives at home with husband.   Right-handed.   No caffeine use.     PHYSICAL EXAM   Vitals:   10/09/16 1018  BP: (!) 146/58  Pulse: (!) 56  Weight: 164 lb 12 oz (74.7 kg)  Height: 5\' 6"  (1.676 m)    Not recorded      Body mass index is 26.59 kg/m.  PHYSICAL EXAMNIATION:  Gen: NAD, conversant, well nourised, obese, well groomed                     Cardiovascular: Regular rate rhythm, no peripheral edema, warm, nontender. Eyes: Conjunctivae clear without exudates or hemorrhage Neck: Supple, no carotid bruits. Pulmonary: Clear to auscultation bilaterally   NEUROLOGICAL EXAM:  MENTAL STATUS: MMSE - Mini Mental State Exam 10/09/2016  Orientation to time 5  Orientation to Place 5  Registration 3  Attention/ Calculation 5  Recall 2  Language- name 2 objects 2  Language- repeat 1  Language- follow 3 step command 3  Language- read & follow direction 1  Write a sentence 1  Copy design 1  Total score 29  animal naming 19    CRANIAL NERVES: CN II: Visual fields are full to confrontation. Fundoscopic exam is normal with sharp discs and no vascular changes. Pupils are round equal and briskly reactive to light. CN III, IV, VI: extraocular movement are normal. No ptosis. CN V: Facial sensation is intact to pinprick in all 3 divisions bilaterally. Corneal responses are intact.  CN VII: Face is symmetric with normal eye closure and smile. CN VIII: Hearing is  normal to rubbing fingers CN IX, X: Palate elevates symmetrically. Phonation is normal. CN XI: Head turning and shoulder shrug are intact CN XII: Tongue is midline with normal movements and no atrophy.  MOTOR: There is no pronator drift of out-stretched arms. Muscle bulk and tone are normal. Muscle strength is normal.  REFLEXES: Reflexes are 2+ and symmetric at the biceps, triceps, knees, and ankles. Plantar responses are flexor.  SENSORY: Intact to light touch, pinprick, positional sensation and vibratory sensation are intact in fingers and toes.  COORDINATION: Rapid alternating movements and fine finger movements are intact. There is no dysmetria on finger-to-nose  and heel-knee-shin.    GAIT/STANCE: valgus knee, mildly unsteady Romberg is absent.   DIAGNOSTIC DATA (LABS, IMAGING, TESTING) - I reviewed patient records, labs, notes, testing and imaging myself where available.   ASSESSMENT AND PLAN  Myrella Fahs Brinson is a 72 y.o. female    Mild cognitive impairment  Strong family history of Alzheimer's disease  MRI of the brain without contrast  Laboratory evaluation from primary care physician   Marcial Pacas, M.D. Ph.D.  [facility] [address] [place], [place] [address] Ph: [phone] Fax: [phone]  CN:[id number], Jenny Reichmann, MD

## 2016-10-10 DIAGNOSIS — H52223 Regular astigmatism, bilateral: Secondary | ICD-10-CM | POA: Diagnosis not present

## 2016-10-10 DIAGNOSIS — Z961 Presence of intraocular lens: Secondary | ICD-10-CM | POA: Diagnosis not present

## 2016-10-10 DIAGNOSIS — H43393 Other vitreous opacities, bilateral: Secondary | ICD-10-CM | POA: Diagnosis not present

## 2016-10-10 DIAGNOSIS — H524 Presbyopia: Secondary | ICD-10-CM | POA: Diagnosis not present

## 2016-10-19 ENCOUNTER — Ambulatory Visit
Admission: RE | Admit: 2016-10-19 | Discharge: 2016-10-19 | Disposition: A | Discharge status: Home / Self Care | Payer: Medicare Other | Source: Ambulatory Visit | Attending: Neurology | Admitting: Neurology

## 2016-10-19 DIAGNOSIS — G3184 Mild cognitive impairment, so stated: Secondary | ICD-10-CM

## 2016-10-19 DIAGNOSIS — R413 Other amnesia: Secondary | ICD-10-CM | POA: Diagnosis not present

## 2016-10-22 ENCOUNTER — Telehealth: Payer: Self-pay | Admitting: Neurology

## 2016-10-22 NOTE — Telephone Encounter (Signed)
Please call patient MRI of the brain showed mild age related changes, there was no acute abnormality, I will review MRI films at her next follow-up visit.   IMPRESSION:  This is a normal age-appropriate MRI of the brain without contrast showing the following: 1.    There is minimal brain volume loss that is normal for age. There are a few scattered T2/FLAIR hyperintense foci consistent with minimal age-appropriate chronic microvascular ischemic change. 2.    There appears to be two benign neuroglial cysts in the frontal operculum region bilaterally that are unlikely to be significant.     3.    There are no acute findings.

## 2016-10-22 NOTE — Telephone Encounter (Signed)
Spoke to patient she is aware of results

## 2016-12-13 ENCOUNTER — Encounter: Payer: Self-pay | Admitting: Neurology

## 2016-12-13 ENCOUNTER — Ambulatory Visit (INDEPENDENT_AMBULATORY_CARE_PROVIDER_SITE_OTHER): Payer: Medicare Other | Admitting: Neurology

## 2016-12-13 VITALS — BP 149/64 | HR 41 | Ht 66.0 in | Wt 167.0 lb

## 2016-12-13 DIAGNOSIS — G3184 Mild cognitive impairment, so stated: Secondary | ICD-10-CM | POA: Diagnosis not present

## 2016-12-13 MED ORDER — DONEPEZIL HCL 10 MG PO TABS: 10.0000 mg | ORAL_TABLET | Freq: Every day | ORAL | 11 refills | Status: DC

## 2016-12-13 MED ORDER — MEMANTINE HCL 10 MG PO TABS: 10.0000 mg | ORAL_TABLET | Freq: Two times a day (BID) | ORAL | 11 refills | Status: DC

## 2016-12-13 NOTE — Progress Notes (Signed)
PATIENT: Lisa Parker DOB: 1945/06/04  Chief Complaint  Patient presents with  . Follow-up  . MCI    doing well.      HISTORICAL  Lisa Parker is a 71 years old right-handed female, seen in refer by her primary care doctor  Lisa Parker for evaluation of memory loss, initial evaluation was on October 09 2016.   I reviewed and summarized the referring note,she has history of hypothyroidism, on supplement, anxiety, she was recently changed from citalopram 40 mg to current escitalopram 20 mg since early May 2018, for worsening anxiety symptoms.  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  Mother had gradual onset memory loss in her eighties, now at age 41, she suffered significant memory loss, cognitive malfunction, to the point of needing help feeding, no longer ambulatory. Her maternal aunt died of Alzheimer's disease at age 22, her younger brother at age 3 also suffered some memory loss.  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 Her husband was recently diagnosed with Parkinson's disease.  I reviewed laboratory evaluation from [facility] in October 2017, elevated ferritin 388,  UPDATE December 13 2016: She is accompanied by her husband at today's clinical visit, her anxiety is under better control with Lexapro 20 mg daily, new mental status examination he is 30/30 today, animal naming is 13, normal clock drawing  We have personally reviewed MRI of the brain without contrast in June 2018, mild generalized atrophy  REVIEW OF SYSTEMS: Full 14 system  review of systems performed and notable only for easy bruise, joint swelling, allergy, anxiety, memory loss, ringing ears  ALLERGIES: Allergies  Allergen Reactions  . Mold Extract  [Trichophyton]     Other reaction(s): Wheezing (ALLERGY/intolerance)  . Pollen Extract     Other reaction(s): Wheezing (ALLERGY/intolerance)  . Cucumber Extract Swelling  . Other     Melon and Walnuts - swelling    HOME MEDICATIONS: Current Outpatient Prescriptions  Medication Sig Dispense Refill  . ALPRAZolam (XANAX) 0.5 MG tablet Take 0.5 mg by mouth at bedtime.    . bisoprolol-hydrochlorothiazide (ZIAC) 2.5-6.25 MG per tablet Take 1 tablet by mouth daily.     . budesonide-formoterol (SYMBICORT) 80-4.5 MCG/ACT inhaler Inhale 2 puffs into the lungs 2 (two) times daily.    . Cholecalciferol (VITAMIN D) 2000 UNITS tablet Take 2,000 Units by mouth daily.    Marland Kitchen escitalopram (LEXAPRO) 20 MG tablet Take 20 mg by mouth daily.     Marland Kitchen levothyroxine (SYNTHROID, LEVOTHROID) 175 MCG tablet Take 175 mcg by mouth daily.     . meloxicam (MOBIC) 15 MG tablet      No current facility-administered medications for this visit.     PAST MEDICAL HISTORY: Past Medical History:  Diagnosis Date  . Anxiety   . Arthritis   . Asthma   . HTN (hypertension)   . Hypothyroid   . Memory loss   . Seasonal allergies   . Urinary tract infection     PAST SURGICAL HISTORY: Past Surgical History:  Procedure Laterality Date  . CATARACT EXTRACTION, BILATERAL    .  CESAREAN SECTION    . FINGER SURGERY     left pinky  . HERNIA REPAIR  2004  . KNEE SURGERY Left   . LAPAROSCOPIC GASTRIC SLEEVE RESECTION    . MASS EXCISION Left 10/20/2015   Procedure: EXCISION MASS LEFT SMALL FINGER;  Surgeon: Leanora Cover, MD;  Location: [place];  Service: Orthopedics;  Laterality: Left;  . TONSILLECTOMY AND ADENOIDECTOMY      FAMILY HISTORY: Family History  Problem Relation Age of Onset  . Alzheimer's disease Mother   . Lung  cancer Father   . Heart disease Unknown   . Asthma Unknown   . Alzheimer's disease Maternal Aunt     SOCIAL HISTORY:  Social History   Social History  . Marital status: Married    Spouse name: N/A  . Number of children: 1  . Years of education: some college   Occupational History  . farm work - retired    Social History Main Topics  . Smoking status: Former Smoker    Packs/day: 0.00    Years: 0.00    Types: Cigarettes  . Smokeless tobacco: Former Systems developer    Quit date: 05/07/1967  . Alcohol use No  . Drug use: No  . Sexual activity: Not on file   Other Topics Concern  . Not on file   Social History Narrative   Lives at home with husband.   Right-handed.   No caffeine use.     PHYSICAL EXAM   Vitals:   12/13/16 0949  BP: (!) 149/64  Pulse: (!) 41  Weight: 167 lb (75.8 kg)  Height: 5\' 6"  (1.676 m)    Not recorded      Body mass index is 26.95 kg/m.  PHYSICAL EXAMNIATION:  Gen: NAD, conversant, well nourised, obese, well groomed                     Cardiovascular: Regular rate rhythm, no peripheral edema, warm, nontender. Eyes: Conjunctivae clear without exudates or hemorrhage Neck: Supple, no carotid bruits. Pulmonary: Clear to auscultation bilaterally   NEUROLOGICAL EXAM:  MENTAL STATUS: MMSE - Mini Mental State Exam 12/13/2016 10/09/2016  Orientation to time 5 5  Orientation to Place 5 5  Registration 3 3  Attention/ Calculation 5 5  Recall 3 2  Language- name 2 objects 2 2  Language- repeat 1 1  Language- follow 3 step command 3 3  Language- read & follow direction 1 1  Write a sentence 1 1  Copy design 1 1  Total score 30 29  animal naming 13, normal clock drawing    CRANIAL NERVES: CN II: Visual fields are full to confrontation. Fundoscopic exam is normal with sharp discs and no vascular changes. Pupils are round equal and briskly reactive to light. CN III, IV, VI: extraocular movement are normal. No ptosis. CN V: Facial sensation is  intact to pinprick in all 3 divisions bilaterally. Corneal responses are intact.  CN VII: Face is symmetric with normal eye closure and smile. CN VIII: Hearing is normal to rubbing fingers CN IX, X: Palate elevates symmetrically. Phonation is normal. CN XI: Head turning and shoulder shrug are intact CN XII: Tongue is midline with normal movements and no atrophy.  MOTOR: There is no pronator drift of out-stretched arms. Muscle bulk and tone are normal. Muscle strength is normal.  REFLEXES: Reflexes are 2+ and symmetric at the biceps, triceps, knees, and ankles. Plantar responses are flexor.  SENSORY: Intact to light  touch, pinprick, positional sensation and vibratory sensation are intact in fingers and toes.  COORDINATION: Rapid alternating movements and fine finger movements are intact. There is no dysmetria on finger-to-nose and heel-knee-shin.    GAIT/STANCE: valgus knee, mildly unsteady Romberg is absent.   DIAGNOSTIC DATA (LABS, IMAGING, TESTING) - I reviewed patient records, labs, notes, testing and imaging myself where available.   ASSESSMENT AND PLAN  Lisa Parker is a 71 y.o. female    Mild cognitive impairment  Strong family history of Alzheimer's disease  MRI of the brain without contrast showed mild generalized atrophy  Will get Laboratory evaluation from primary care physician  After discussed with patient, started Namenda 10 mg twice a day, Aricept 10 mg daily  Also refer her for research trial   Marcial Pacas, M.D. Ph.D.  [facility] [address], [place], [place] [address] Ph: [phone] Fax: [phone]  [id number], Jenny Reichmann, MD

## 2017-03-14 DIAGNOSIS — E039 Hypothyroidism, unspecified: Secondary | ICD-10-CM | POA: Diagnosis not present

## 2017-03-14 DIAGNOSIS — R7309 Other abnormal glucose: Secondary | ICD-10-CM | POA: Diagnosis not present

## 2017-03-14 DIAGNOSIS — I10 Essential (primary) hypertension: Secondary | ICD-10-CM | POA: Diagnosis not present

## 2017-03-14 DIAGNOSIS — E782 Mixed hyperlipidemia: Secondary | ICD-10-CM | POA: Diagnosis not present

## 2017-03-14 DIAGNOSIS — E785 Hyperlipidemia, unspecified: Secondary | ICD-10-CM | POA: Diagnosis not present

## 2017-03-20 ENCOUNTER — Ambulatory Visit: Payer: Self-pay | Admitting: Neurology

## 2017-04-01 DIAGNOSIS — L820 Inflamed seborrheic keratosis: Secondary | ICD-10-CM | POA: Diagnosis not present

## 2017-04-01 DIAGNOSIS — L821 Other seborrheic keratosis: Secondary | ICD-10-CM | POA: Diagnosis not present

## 2017-04-01 DIAGNOSIS — L570 Actinic keratosis: Secondary | ICD-10-CM | POA: Diagnosis not present

## 2017-04-01 DIAGNOSIS — X32XXXA Exposure to sunlight, initial encounter: Secondary | ICD-10-CM | POA: Diagnosis not present

## 2017-04-18 DIAGNOSIS — M1711 Unilateral primary osteoarthritis, right knee: Secondary | ICD-10-CM | POA: Diagnosis not present

## 2017-04-18 DIAGNOSIS — M1712 Unilateral primary osteoarthritis, left knee: Secondary | ICD-10-CM | POA: Diagnosis not present

## 2017-04-18 DIAGNOSIS — Z1389 Encounter for screening for other disorder: Secondary | ICD-10-CM | POA: Diagnosis not present

## 2017-05-28 ENCOUNTER — Ambulatory Visit: Payer: Medicare Other | Admitting: Neurology

## 2017-05-28 ENCOUNTER — Encounter: Payer: Self-pay | Admitting: Neurology

## 2017-05-28 VITALS — BP 166/77 | HR 53 | Ht 66.0 in | Wt 168.0 lb

## 2017-05-28 DIAGNOSIS — G3184 Mild cognitive impairment, so stated: Secondary | ICD-10-CM | POA: Diagnosis not present

## 2017-05-28 MED ORDER — DONEPEZIL HCL 10 MG PO TABS: 10.0000 mg | ORAL_TABLET | Freq: Every day | ORAL | 4 refills | Status: DC

## 2017-05-28 MED ORDER — MEMANTINE HCL 10 MG PO TABS: 10.0000 mg | ORAL_TABLET | Freq: Two times a day (BID) | ORAL | 4 refills | Status: DC

## 2017-05-28 NOTE — Progress Notes (Addendum)
PATIENT: Lisa Parker DOB: 04-Mar-1946  Chief Complaint  Patient presents with  . Mild cognitive impairment    MMSE 30/30 - 22 animals.  She is here with her husband, Arnell Sieving.  Feels her memory is better since starting donepezil and memantine.     HISTORICAL  Aliveah Gallant Bellucci is a 72 years old right-handed female, seen in refer by her primary care doctor  Sharilyn Sites for evaluation of memory loss, initial evaluation was on October 09 2016.   I reviewed and summarized the referring note,she has history of hypothyroidism, on supplement, anxiety, she was recently changed from citalopram 40 mg to current escitalopram 20 mg since early May 2018, for worsening anxiety symptoms.  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  Mother had gradual onset memory loss in her eighties, now at age 67, she suffered significant memory loss, cognitive malfunction, to the point of needing help feeding, no longer ambulatory. Her maternal aunt died of Alzheimer's disease at age 22, her younger brother at age 32 also suffered some memory loss.  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 Her husband was recently diagnosed with Parkinson's disease.  I reviewed laboratory evaluation from [facility] in October 2017, elevated ferritin 388,  UPDATE December 13 2016: She is accompanied by her husband at today's clinical visit, her anxiety is under better control with Lexapro 20 mg daily, new mental status examination he is 30/30 today, animal naming is 13, normal clock drawing  We have  personally reviewed MRI of the brain without contrast in June 2018, mild generalized atrophy  UPDATE May 28 2017: She is overall doing very well, is able to keep up with her busy daily routine, there is no significant memory loss, tolerating Namenda 10 mg twice a day, Aricept 10 mg daily  REVIEW OF SYSTEMS: Full 14 system review of systems performed and notable only for easy bruise, joint swelling, allergy, anxiety, memory loss, ringing ears  ALLERGIES: Allergies  Allergen Reactions  . Mold Extract  [Trichophyton]     Other reaction(s): Wheezing (ALLERGY/intolerance)  . Pollen Extract     Other reaction(s): Wheezing (ALLERGY/intolerance)  . Cucumber Extract Swelling  . Other     Melon and Walnuts - swelling    HOME MEDICATIONS: Current Outpatient Medications  Medication Sig Dispense Refill  . ALPRAZolam (XANAX) 0.5 MG tablet Take 0.5 mg by mouth at bedtime.    . bisoprolol-hydrochlorothiazide (ZIAC) 2.5-6.25 MG per tablet Take 1 tablet by mouth daily.     . budesonide-formoterol (SYMBICORT) 80-4.5 MCG/ACT inhaler Inhale 2 puffs into the lungs 2 (two) times daily.    . Cholecalciferol (VITAMIN D) 2000 UNITS tablet Take 2,000 Units by mouth daily.    Marland Kitchen donepezil (ARICEPT) 10 MG tablet Take 1 tablet (10 mg total) by mouth at bedtime. 30 tablet 11  . escitalopram (LEXAPRO) 20 MG tablet Take 20 mg by mouth daily.     Marland Kitchen levothyroxine (SYNTHROID, LEVOTHROID) 175 MCG tablet Take 175 mcg by mouth daily.     . meloxicam (MOBIC) 15 MG tablet     .  memantine (NAMENDA) 10 MG tablet Take 1 tablet (10 mg total) by mouth 2 (two) times daily. 60 tablet 11   No current facility-administered medications for this visit.     PAST MEDICAL HISTORY: Past Medical History:  Diagnosis Date  . Anxiety   . Arthritis   . Asthma   . HTN (hypertension)   . Hypothyroid   . Memory loss   . Seasonal allergies   . Urinary tract infection     PAST SURGICAL HISTORY: Past Surgical History:  Procedure  Laterality Date  . CATARACT EXTRACTION, BILATERAL    . CESAREAN SECTION    . FINGER SURGERY     left pinky  . HERNIA REPAIR  2004  . KNEE SURGERY Left   . LAPAROSCOPIC GASTRIC SLEEVE RESECTION    . MASS EXCISION Left 10/20/2015   Procedure: EXCISION MASS LEFT SMALL FINGER;  Surgeon: Leanora Cover, MD;  Location: [facility];  Service: Orthopedics;  Laterality: Left;  . TONSILLECTOMY AND ADENOIDECTOMY      FAMILY HISTORY: Family History  Problem Relation Age of Onset  . Alzheimer's disease Mother   . Lung cancer Father   . Heart disease Unknown   . Asthma Unknown   . Alzheimer's disease Maternal Aunt     SOCIAL HISTORY:  Social History   Socioeconomic History  . Marital status: Married    Spouse name: Not on file  . Number of children: 1  . Years of education: some college  . Highest education level: Not on file  Social Needs  . Financial resource strain: Not on file  . Food insecurity - worry: Not on file  . Food insecurity - inability: Not on file  . Transportation needs - medical: Not on file  . Transportation needs - non-medical: Not on file  Occupational History  . Occupation: farm work - retired  Immunologist  . Smoking status: Former Smoker    Packs/day: 0.00    Years: 0.00    Pack years: 0.00    Types: Cigarettes  . Smokeless tobacco: Former Systems developer    Quit date: 05/07/1967  Substance and Sexual Activity  . Alcohol use: No  . Drug use: No  . Sexual activity: Not on file  Other Topics Concern  . Not on file  Social History Narrative   Lives at home with husband.   Right-handed.   No caffeine use.     PHYSICAL EXAM   Vitals:   05/28/17 0948  BP: (!) 166/77  Pulse: (!) 53  Weight: 168 lb (76.2 kg)  Height: 5\' 6"  (1.676 m)    Not recorded      Body mass index is 27.12 kg/m.  PHYSICAL EXAMNIATION:  Gen: NAD, conversant, well nourised, obese, well groomed                     Cardiovascular: Regular rate rhythm, no peripheral  edema, warm, nontender. Eyes: Conjunctivae clear without exudates or hemorrhage Neck: Supple, no carotid bruits. Pulmonary: Clear to auscultation bilaterally   NEUROLOGICAL EXAM:  MENTAL STATUS: MMSE - Mini Mental State Exam 05/28/2017 12/13/2016 10/09/2016  Orientation to time 5 5 5   Orientation to Place 5 5 5   Registration 3 3 3   Attention/ Calculation 5 5 5   Recall 3 3 2   Language- name 2 objects 2 2 2   Language- repeat 1 1 1   Language- follow 3 step command 3 3 3   Language- read & follow direction 1 1 1   Write  a sentence 1 1 1   Copy design 1 1 1   Total score 30 30 29   animal naming 22    CRANIAL NERVES: CN II: Visual fields are full to confrontation. Fundoscopic exam is normal with sharp discs and no vascular changes. Pupils are round equal and briskly reactive to light. CN III, IV, VI: extraocular movement are normal. No ptosis. CN V: Facial sensation is intact to pinprick in all 3 divisions bilaterally. Corneal responses are intact.  CN VII: Face is symmetric with normal eye closure and smile. CN VIII: Hearing is normal to rubbing fingers CN IX, X: Palate elevates symmetrically. Phonation is normal. CN XI: Head turning and shoulder shrug are intact CN XII: Tongue is midline with normal movements and no atrophy.  MOTOR: There is no pronator drift of out-stretched arms. Muscle bulk and tone are normal. Muscle strength is normal.  REFLEXES: Reflexes are 2+ and symmetric at the biceps, triceps, knees, and ankles. Plantar responses are flexor.  SENSORY: Intact to light touch, pinprick, positional sensation and vibratory sensation are intact in fingers and toes.  COORDINATION: Rapid alternating movements and fine finger movements are intact. There is no dysmetria on finger-to-nose and heel-knee-shin.    GAIT/STANCE: valgus knee, mildly unsteady Romberg is absent.   DIAGNOSTIC DATA (LABS, IMAGING, TESTING) - I reviewed patient records, labs, notes, testing and imaging  myself where available.   ASSESSMENT AND PLAN  Ahuva Poynor Dunkleberger is a 72 y.o. female    Mild cognitive impairment  Strong family history of Alzheimer's disease  MRI of the brain without contrast showed mild generalized atrophy  Continue Namenda 10 mg twice a day, Aricept 10 mg daily  Continue moderate exercise   Marcial Pacas, M.D. Ph.D.  [facility] [address] [place], [place] [address] Ph: [phone] Fax: [phone]  TX:[email or web address], Jenny Reichmann, MD

## 2017-06-03 DIAGNOSIS — Z713 Dietary counseling and surveillance: Secondary | ICD-10-CM | POA: Diagnosis not present

## 2017-06-03 DIAGNOSIS — Z903 Acquired absence of stomach [part of]: Secondary | ICD-10-CM | POA: Diagnosis not present

## 2017-06-28 DIAGNOSIS — K219 Gastro-esophageal reflux disease without esophagitis: Secondary | ICD-10-CM | POA: Diagnosis not present

## 2017-06-28 DIAGNOSIS — Z0001 Encounter for general adult medical examination with abnormal findings: Secondary | ICD-10-CM | POA: Diagnosis not present

## 2017-06-28 DIAGNOSIS — I10 Essential (primary) hypertension: Secondary | ICD-10-CM | POA: Diagnosis not present

## 2017-06-28 DIAGNOSIS — E782 Mixed hyperlipidemia: Secondary | ICD-10-CM | POA: Diagnosis not present

## 2017-07-08 ENCOUNTER — Other Ambulatory Visit (HOSPITAL_COMMUNITY): Payer: Self-pay | Admitting: Family Medicine

## 2017-07-08 DIAGNOSIS — Z1231 Encounter for screening mammogram for malignant neoplasm of breast: Secondary | ICD-10-CM

## 2017-07-24 ENCOUNTER — Ambulatory Visit (HOSPITAL_COMMUNITY)
Admission: RE | Admit: 2017-07-24 | Discharge: 2017-07-24 | Disposition: A | Discharge status: Home / Self Care | Payer: Medicare Other | Source: Ambulatory Visit | Attending: Family Medicine | Admitting: Family Medicine

## 2017-07-24 ENCOUNTER — Encounter (HOSPITAL_COMMUNITY): Payer: Self-pay

## 2017-07-24 DIAGNOSIS — Z1231 Encounter for screening mammogram for malignant neoplasm of breast: Secondary | ICD-10-CM | POA: Diagnosis not present

## 2017-09-04 DIAGNOSIS — M1991 Primary osteoarthritis, unspecified site: Secondary | ICD-10-CM | POA: Diagnosis not present

## 2017-09-04 DIAGNOSIS — E039 Hypothyroidism, unspecified: Secondary | ICD-10-CM | POA: Diagnosis not present

## 2017-09-04 DIAGNOSIS — M25562 Pain in left knee: Secondary | ICD-10-CM | POA: Diagnosis not present

## 2017-09-04 DIAGNOSIS — Z1389 Encounter for screening for other disorder: Secondary | ICD-10-CM | POA: Diagnosis not present

## 2017-09-24 DIAGNOSIS — M19072 Primary osteoarthritis, left ankle and foot: Secondary | ICD-10-CM | POA: Diagnosis not present

## 2017-09-24 DIAGNOSIS — M170 Bilateral primary osteoarthritis of knee: Secondary | ICD-10-CM | POA: Diagnosis not present

## 2017-09-24 DIAGNOSIS — M19071 Primary osteoarthritis, right ankle and foot: Secondary | ICD-10-CM | POA: Diagnosis not present

## 2017-11-06 DIAGNOSIS — M19079 Primary osteoarthritis, unspecified ankle and foot: Secondary | ICD-10-CM | POA: Diagnosis not present

## 2017-11-06 DIAGNOSIS — M25572 Pain in left ankle and joints of left foot: Secondary | ICD-10-CM | POA: Diagnosis not present

## 2017-11-06 DIAGNOSIS — M67969 Unspecified disorder of synovium and tendon, unspecified lower leg: Secondary | ICD-10-CM | POA: Diagnosis not present

## 2017-11-06 DIAGNOSIS — M25571 Pain in right ankle and joints of right foot: Secondary | ICD-10-CM | POA: Diagnosis not present

## 2017-12-12 DIAGNOSIS — Z1389 Encounter for screening for other disorder: Secondary | ICD-10-CM | POA: Diagnosis not present

## 2018-01-21 DIAGNOSIS — M170 Bilateral primary osteoarthritis of knee: Secondary | ICD-10-CM | POA: Diagnosis not present

## 2018-01-23 DIAGNOSIS — Z23 Encounter for immunization: Secondary | ICD-10-CM | POA: Diagnosis not present

## 2018-05-20 DIAGNOSIS — M170 Bilateral primary osteoarthritis of knee: Secondary | ICD-10-CM | POA: Diagnosis not present

## 2018-05-29 ENCOUNTER — Ambulatory Visit: Payer: Medicare Other | Admitting: Neurology

## 2018-05-29 ENCOUNTER — Encounter: Payer: Self-pay | Admitting: Neurology

## 2018-05-29 VITALS — BP 147/90 | HR 59 | Ht 66.0 in | Wt 173.0 lb

## 2018-05-29 DIAGNOSIS — G3184 Mild cognitive impairment, so stated: Secondary | ICD-10-CM

## 2018-05-29 NOTE — Patient Instructions (Signed)
Lisa Plover Aluisio, MD - [facility]    Address: [address], [place], [place] [address]    Phone: [phone]

## 2018-05-29 NOTE — Progress Notes (Signed)
PATIENT: Lisa Parker DOB: [birth year]  Chief Complaint  Patient presents with  . Mild Cognitive Impairment    MMSE 30/30 - 18 animals.  She is here with her husband, Arnell Sieving, for her yearly follow up.  Feels memory is stable.     HISTORICAL  Lisa Parker is a 72 years old right-handed female, seen in refer by her primary care doctor  Sharilyn Sites for evaluation of memory loss, initial evaluation was on October 09 2016.   I reviewed and summarized the referring note,she has history of hypothyroidism, on supplement, anxiety, she was recently changed from citalopram 40 mg to current escitalopram 20 mg since early May 2018, for worsening anxiety symptoms.  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  Mother had gradual onset memory loss in her eighties, now at age 68, she suffered significant memory loss, cognitive malfunction, to the point of needing help feeding, no longer ambulatory. Her maternal aunt died of Alzheimer's disease at age 29, her younger brother at age 59 also suffered some memory loss.  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 Her husband was recently diagnosed with Parkinson's disease.  I reviewed laboratory evaluation from [facility] in October 2017, elevated ferritin 388,  UPDATE December 13 2016: She is accompanied by her husband at today's clinical visit, her anxiety is under better control with Lexapro 20 mg daily, new mental status examination he is 30/30 today, animal naming is 13, normal clock drawing  We have personally reviewed MRI  of the brain without contrast in June 2018, mild generalized atrophy  UPDATE May 28 2017: She is overall doing very well, is able to keep up with her busy daily routine, there is no significant memory loss, tolerating Namenda 10 mg twice a day, Aricept 10 mg daily  UPDATE May 29 2018: She is doing much better, 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 Mold Extract  [Trichophyton]     Other reaction(s): Wheezing (ALLERGY/intolerance)  . Pollen Extract     Other reaction(s): Wheezing (ALLERGY/intolerance)  . Cucumber Extract Swelling  . Other     Melon and Walnuts - swelling    HOME MEDICATIONS: Current Outpatient Medications  Medication Sig Dispense Refill  . ALPRAZolam (XANAX) 0.5 MG tablet Take 0.5 mg by mouth at bedtime.    . bisoprolol-hydrochlorothiazide (ZIAC) 2.5-6.25 MG per tablet Take 1 tablet by mouth daily.     . budesonide-formoterol (SYMBICORT) 80-4.5 MCG/ACT inhaler Inhale 2 puffs into the lungs 2 (two) times daily.    . Cholecalciferol (VITAMIN D) 2000 UNITS tablet Take 2,000 Units by mouth daily.    Marland Kitchen donepezil (ARICEPT) 10 MG tablet Take 1 tablet (10 mg total) by mouth at bedtime. 90 tablet 4  . escitalopram (LEXAPRO) 20 MG tablet Take 20 mg by mouth daily.     Marland Kitchen  levothyroxine (SYNTHROID, LEVOTHROID) 175 MCG tablet Take 175 mcg by mouth daily.     . meloxicam (MOBIC) 15 MG tablet     . memantine (NAMENDA) 10 MG tablet Take 1 tablet (10 mg total) by mouth 2 (two) times daily. 180 tablet 4   No current facility-administered medications for this visit.     PAST MEDICAL HISTORY: Past Medical History:  Diagnosis Date  . Anxiety   . Arthritis   . Asthma   . HTN (hypertension)   .  Hypothyroid   . Memory loss   . Seasonal allergies   . Urinary tract infection     PAST SURGICAL HISTORY: Past Surgical History:  Procedure Laterality Date  . CATARACT EXTRACTION, BILATERAL    . CESAREAN SECTION    . FINGER SURGERY     left pinky  . HERNIA REPAIR  2004  . KNEE SURGERY Left   . LAPAROSCOPIC GASTRIC SLEEVE RESECTION    . MASS EXCISION Left 10/20/2015   Procedure: EXCISION MASS LEFT SMALL FINGER;  Surgeon: Leanora Cover, MD;  Location: [facility];  Service: Orthopedics;  Laterality: Left;  . TONSILLECTOMY AND ADENOIDECTOMY      FAMILY HISTORY: Family History  Problem Relation Age of Onset  . Alzheimer's disease Mother   . Lung cancer Father   . Heart disease Unknown   . Asthma Unknown   . Alzheimer's disease Maternal Aunt     SOCIAL HISTORY:  Social History   Socioeconomic History  . Marital status: Married    Spouse name: Not on file  . Number of children: 1  . Years of education: some college  . Highest education level: Not on file  Occupational History  . 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 Financial resource strain: Not on file  . Food insecurity:    Worry: Not on file    Inability: Not on file  . Transportation needs:    Medical: Not on file    Non-medical: Not on file  Tobacco Use  . Smoking status: Former Smoker    Packs/day: 0.00    Years: 0.00    Pack years: 0.00    Types: Cigarettes  . Smokeless tobacco: Former Systems developer    Quit date: 05/07/1967  Substance and Sexual Activity  . Alcohol use: No  . Drug use: No  . Sexual activity: Not on file  Lifestyle  . Physical activity:    Days per week: Not on file    Minutes per session: Not on file  . Stress: Not on file  Relationships  . Social connections:    Talks on phone: Not on file    Gets together: Not on file    Attends religious service: Not on file    Active member of club or organization: Not on file    Attends meetings of clubs or organizations:  Not on file    Relationship status: Not on file  . Intimate partner violence:    Fear of current or ex partner: Not on file    Emotionally abused: Not on file    Physically abused: Not on file    Forced sexual activity: Not on file  Other Topics Concern  . Not on file  Social History Narrative   Lives at home with husband.   Right-handed.   No caffeine use.     PHYSICAL EXAM   Vitals:   05/29/18 1033  BP: (!) 147/90  Pulse: (!) 59  Weight: 173  lb (78.5 kg)  Height: 5\' 6"  (1.676 m)    Not recorded      Body mass index is 27.92 kg/m.  PHYSICAL EXAMNIATION:  Gen: NAD, conversant, well nourised, obese, well groomed                     Cardiovascular: Regular rate rhythm, no peripheral edema, warm, nontender. Eyes: Conjunctivae clear without exudates or hemorrhage Neck: Supple, no carotid bruits. Pulmonary: Clear to auscultation bilaterally   NEUROLOGICAL EXAM:  MENTAL STATUS: MMSE - Mini Mental State Exam 05/29/2018 05/28/2017 12/13/2016  Orientation to time 5 5 5   Orientation to Place 5 5 5   Registration 3 3 3   Attention/ Calculation 5 5 5   Recall 3 3 3   Language- name 2 objects 2 2 2   Language- repeat 1 1 1   Language- follow 3 step command 3 3 3   Language- read & follow direction 1 1 1   Write a sentence 1 1 1   Copy design 1 1 1   Total score 30 30 30   animal naming 18    CRANIAL NERVES: CN II: Visual fields are full to confrontation. Pupils are round equal and briskly reactive to light. CN III, IV, VI: extraocular movement are normal. No ptosis. CN V: Facial sensation is intact to pinprick in all 3 divisions bilaterally. Corneal responses are intact.  CN VII: Face is symmetric with normal eye closure and smile. CN VIII: Hearing is normal to rubbing fingers CN IX, X: Palate elevates symmetrically. Phonation is normal. CN XI: Head turning and shoulder shrug are intact CN XII: Tongue is midline with normal movements and no atrophy.  MOTOR: There is no  pronator drift of out-stretched arms. Muscle bulk and tone are normal. Muscle strength is normal.  REFLEXES: Reflexes are 2+ and symmetric at the biceps, triceps, knees, and ankles. Plantar responses are flexor.  SENSORY: Intact to light touch, pinprick, positional sensation and vibratory sensation are intact in fingers and toes.  COORDINATION: Rapid alternating movements and fine finger movements are intact. There is no dysmetria on finger-to-nose and heel-knee-shin.    GAIT/STANCE: valgus knee, mildly unsteady Romberg is absent.   DIAGNOSTIC DATA (LABS, IMAGING, TESTING) - I reviewed patient records, labs, notes, testing and imaging myself where available.   ASSESSMENT AND PLAN  Lisa Parker is a 73 y.o. female    Mild cognitive impairment  Strong family history of Alzheimer's disease  MRI of the brain without contrast showed mild generalized atrophy  Continue Namenda 10 mg twice a day, Aricept 10 mg daily  Continue moderate exercise    Marcial Pacas, M.D. Ph.D.  [facility]  [address], [place], [place] [address] Ph: [phone] Fax: [phone]  QJ:JHERDEY, Jenny Reichmann, MD

## 2018-06-05 ENCOUNTER — Other Ambulatory Visit (HOSPITAL_COMMUNITY): Payer: Self-pay | Admitting: Family Medicine

## 2018-06-05 ENCOUNTER — Other Ambulatory Visit: Payer: Self-pay | Admitting: Family Medicine

## 2018-06-05 DIAGNOSIS — E039 Hypothyroidism, unspecified: Secondary | ICD-10-CM | POA: Diagnosis not present

## 2018-06-05 DIAGNOSIS — I10 Essential (primary) hypertension: Secondary | ICD-10-CM | POA: Diagnosis not present

## 2018-06-05 DIAGNOSIS — E7849 Other hyperlipidemia: Secondary | ICD-10-CM | POA: Diagnosis not present

## 2018-06-05 DIAGNOSIS — R221 Localized swelling, mass and lump, neck: Secondary | ICD-10-CM

## 2018-06-05 DIAGNOSIS — Z1389 Encounter for screening for other disorder: Secondary | ICD-10-CM | POA: Diagnosis not present

## 2018-06-05 DIAGNOSIS — Z0001 Encounter for general adult medical examination with abnormal findings: Secondary | ICD-10-CM | POA: Diagnosis not present

## 2018-06-11 ENCOUNTER — Other Ambulatory Visit (HOSPITAL_COMMUNITY): Payer: Self-pay | Admitting: Family Medicine

## 2018-06-11 DIAGNOSIS — Z1231 Encounter for screening mammogram for malignant neoplasm of breast: Secondary | ICD-10-CM

## 2018-06-23 ENCOUNTER — Ambulatory Visit (HOSPITAL_COMMUNITY): Payer: Medicare Other

## 2018-07-07 ENCOUNTER — Ambulatory Visit (HOSPITAL_COMMUNITY)
Admission: RE | Admit: 2018-07-07 | Discharge: 2018-07-07 | Disposition: A | Discharge status: Home / Self Care | Payer: Medicare Other | Source: Ambulatory Visit | Attending: Family Medicine | Admitting: Family Medicine

## 2018-07-07 DIAGNOSIS — R221 Localized swelling, mass and lump, neck: Secondary | ICD-10-CM | POA: Diagnosis not present

## 2018-07-09 ENCOUNTER — Other Ambulatory Visit: Payer: Self-pay | Admitting: Family Medicine

## 2018-07-09 ENCOUNTER — Other Ambulatory Visit (HOSPITAL_COMMUNITY): Payer: Self-pay | Admitting: Family Medicine

## 2018-07-09 DIAGNOSIS — L989 Disorder of the skin and subcutaneous tissue, unspecified: Secondary | ICD-10-CM

## 2018-07-24 ENCOUNTER — Other Ambulatory Visit: Payer: Self-pay

## 2018-07-24 ENCOUNTER — Encounter (HOSPITAL_COMMUNITY): Payer: Self-pay

## 2018-07-24 ENCOUNTER — Ambulatory Visit (HOSPITAL_COMMUNITY)
Admission: RE | Admit: 2018-07-24 | Discharge: 2018-07-24 | Disposition: A | Discharge status: Home / Self Care | Payer: Medicare Other | Source: Ambulatory Visit | Attending: Family Medicine | Admitting: Family Medicine

## 2018-07-24 DIAGNOSIS — R221 Localized swelling, mass and lump, neck: Secondary | ICD-10-CM | POA: Diagnosis not present

## 2018-07-24 DIAGNOSIS — L989 Disorder of the skin and subcutaneous tissue, unspecified: Secondary | ICD-10-CM | POA: Diagnosis not present

## 2018-07-24 LAB — POCT I-STAT CREATININE: CREATININE: 0.8 mg/dL (ref 0.44–1.00)

## 2018-07-24 MED ORDER — IOHEXOL 300 MG/ML  SOLN
75.0000 mL | Freq: Once | INTRAMUSCULAR | Status: AC | PRN
  Administered 2018-07-24: 75 mL via INTRAVENOUS

## 2018-07-30 ENCOUNTER — Ambulatory Visit (HOSPITAL_COMMUNITY): Payer: Medicare Other

## 2018-09-30 DIAGNOSIS — M170 Bilateral primary osteoarthritis of knee: Secondary | ICD-10-CM | POA: Diagnosis not present

## 2018-10-02 ENCOUNTER — Other Ambulatory Visit: Payer: Self-pay | Admitting: *Deleted

## 2018-10-02 ENCOUNTER — Other Ambulatory Visit: Payer: Self-pay | Admitting: Neurology

## 2018-10-02 MED ORDER — MEMANTINE HCL 10 MG PO TABS: ORAL_TABLET | ORAL | 3 refills | Status: DC

## 2018-10-14 DIAGNOSIS — M1712 Unilateral primary osteoarthritis, left knee: Secondary | ICD-10-CM | POA: Diagnosis not present

## 2018-10-14 DIAGNOSIS — M1711 Unilateral primary osteoarthritis, right knee: Secondary | ICD-10-CM | POA: Diagnosis not present

## 2018-10-15 ENCOUNTER — Other Ambulatory Visit: Payer: Self-pay | Admitting: Orthopedic Surgery

## 2018-11-13 DIAGNOSIS — Z1389 Encounter for screening for other disorder: Secondary | ICD-10-CM | POA: Diagnosis not present

## 2018-11-13 DIAGNOSIS — R7309 Other abnormal glucose: Secondary | ICD-10-CM | POA: Diagnosis not present

## 2018-11-13 DIAGNOSIS — E039 Hypothyroidism, unspecified: Secondary | ICD-10-CM | POA: Diagnosis not present

## 2018-11-20 ENCOUNTER — Encounter (HOSPITAL_COMMUNITY): Payer: Self-pay

## 2018-11-20 DIAGNOSIS — M1711 Unilateral primary osteoarthritis, right knee: Secondary | ICD-10-CM | POA: Diagnosis present

## 2018-11-20 NOTE — H&P (Signed)
TOTAL KNEE ADMISSION H&P  Patient is being admitted for right total knee arthroplasty.  Subjective:  Chief Complaint:right knee pain.  HPI: Lisa Parker, 73 y.o. female, has a history of pain and functional disability in the right knee due to arthritis and has failed non-surgical conservative treatments for greater than 12 weeks to includeNSAID's and/or analgesics, corticosteriod injections, use of assistive devices, weight reduction as appropriate and activity modification.  Onset of symptoms was gradual, starting several years ago with gradually worsening course since that time. The patient noted no past surgery on the right knee(s).  Patient currently rates pain in the right knee(s) at 10 out of 10 with activity. Patient has night pain, worsening of pain with activity and weight bearing, pain that interferes with activities of daily living, pain with passive range of motion and joint swelling.  Patient has evidence of joint space narrowing by imaging studies.   There is no active infection.  Patient Active Problem List   Diagnosis Date Noted  . Mild cognitive impairment 10/09/2016  . High blood pressure 09/15/2013  . Hypothyroidism 09/15/2013  . Obesity (BMI 30-39.9) 09/15/2013  . S/P arthroscopy of left knee 10/02/2011  . Lateral meniscal tear 01/18/2011   Past Medical History:  Diagnosis Date  . Anxiety   . Arthritis   . Asthma   . HTN (hypertension)   . Hypothyroid   . Memory loss   . Seasonal allergies   . Urinary tract infection     Past Surgical History:  Procedure Laterality Date  . CATARACT EXTRACTION, BILATERAL    . CESAREAN SECTION    . FINGER SURGERY     left pinky  . HERNIA REPAIR  2004  . KNEE SURGERY Left   . LAPAROSCOPIC GASTRIC SLEEVE RESECTION    . MASS EXCISION Left 10/20/2015   Procedure: EXCISION MASS LEFT SMALL FINGER;  Surgeon: Leanora Cover, MD;  Location: [facility];  Service: Orthopedics;  Laterality: Left;  . TONSILLECTOMY AND  ADENOIDECTOMY      No current facility-administered medications for this encounter.    Current Outpatient Medications  Medication Sig Dispense Refill Last Dose  . ALPRAZolam (XANAX) 0.5 MG tablet Take 0.5 mg by mouth at bedtime.     . bisoprolol-hydrochlorothiazide (ZIAC) 2.5-6.25 MG per tablet Take 1 tablet by mouth daily.      . budesonide-formoterol (SYMBICORT) 80-4.5 MCG/ACT inhaler Inhale 2 puffs into the lungs 2 (two) times daily.     . Cholecalciferol (VITAMIN D) 2000 UNITS tablet Take 2,000 Units by mouth daily.     Marland Kitchen donepezil (ARICEPT) 10 MG tablet Take 1 tablet (10 mg total) by mouth at bedtime. 90 tablet 4   . escitalopram (LEXAPRO) 20 MG tablet Take 20 mg by mouth daily.      Marland Kitchen levothyroxine (SYNTHROID, LEVOTHROID) 175 MCG tablet Take 175 mcg by mouth daily.      . meloxicam (MOBIC) 15 MG tablet      . memantine (NAMENDA) 10 MG tablet TAKE  (1) TABLET TWICE A DAY. 180 tablet 3    Allergies  Allergen Reactions  . Mold Extract  [Trichophyton]     Other reaction(s): Wheezing (ALLERGY/intolerance)  . Pollen Extract     Other reaction(s): Wheezing (ALLERGY/intolerance)  . Cucumber Extract Swelling  . Other     Melon and Walnuts - swelling    Social History   Tobacco Use  . Smoking status: Former Smoker    Packs/day: 0.00    Years: 0.00  Pack years: 0.00    Types: Cigarettes  . Smokeless tobacco: Former Systems developer    Quit date: 05/07/1967  Substance Use Topics  . Alcohol use: No    Family History  Problem Relation Age of Onset  . Alzheimer's disease Mother   . Lung cancer Father   . Heart disease Other   . Asthma Other   . Alzheimer's disease Maternal Aunt      Review of Systems  Constitutional: Negative.   HENT: Negative.   Eyes: Negative.   Respiratory:       Asthma  Cardiovascular: Negative.   Gastrointestinal: Negative.   Genitourinary: Positive for frequency.  Musculoskeletal: Positive for joint pain and myalgias.  Skin: Negative.   Neurological:  Negative.   Endo/Heme/Allergies: Bruises/bleeds easily.  Psychiatric/Behavioral: The patient is nervous/anxious.     Objective:  Physical Exam  Constitutional: She is oriented to person, place, and time. She appears well-developed and well-nourished.  HENT:  Head: Normocephalic and atraumatic.  Eyes: Pupils are equal, round, and reactive to light.  Neck: Normal range of motion. Neck supple.  Cardiovascular: Intact distal pulses.  Respiratory: Effort normal.  Musculoskeletal:        General: Tenderness present.     Comments: the patient has obvious valgus deformities in bilateral knees.  She has a range from 0-120 on the left and 0-110 on the right.  Her calves are soft and nontender.  She is neurovascularly intact distally.  Minimal laxity with valgus and varus stress testing  Neurological: She is alert and oriented to person, place, and time.  Skin: Skin is warm and dry.  Psychiatric: She has a normal mood and affect. Her behavior is normal. Judgment and thought content normal.    Vital signs in last 24 hours: BP: ()/()  Arterial Line BP: ()/()   Labs:   Estimated body mass index is 27.92 kg/m as calculated from the following:   Height as of 05/29/18: 5\' 6"  (1.676 m).   Weight as of 05/29/18: 78.5 kg.   Imaging Review Plain radiographs demonstrate  bilateral AP weightbearing, bilateral Rosenberg, lateral and sunrise views of bilateral knees are taken and reviewed in office today.  Again the patient has severe valgus deformities with end-stage arthritis lateral compartment bone-on-bone.  Assessment/Plan:  End stage arthritis, right knee   The patient history, physical examination, clinical judgment of the provider and imaging studies are consistent with end stage degenerative joint disease of the right knee(s) and total knee arthroplasty is deemed medically necessary. The treatment options including medical management, injection therapy arthroscopy and arthroplasty were  discussed at length. The risks and benefits of total knee arthroplasty were presented and reviewed. The risks due to aseptic loosening, infection, stiffness, patella tracking problems, thromboembolic complications and other imponderables were discussed. The patient acknowledged the explanation, agreed to proceed with the plan and consent was signed. Patient is being admitted for inpatient treatment for surgery, pain control, PT, OT, prophylactic antibiotics, VTE prophylaxis, progressive ambulation and ADL's and discharge planning. The patient is planning to be discharged home with home health services     Patient's anticipated LOS is less than 2 midnights, meeting these requirements: - Younger than 92 - Lives within 1 hour of care - Has a competent adult at home to recover with post-op recover - NO history of  - Chronic pain requiring opiods  - Diabetes  - Coronary Artery Disease  - Heart failure  - Heart attack  - Stroke  - DVT/VTE  - Cardiac  arrhythmia  - Respiratory Failure/COPD  - Renal failure  - Anemia  - Advanced Liver disease

## 2018-11-20 NOTE — Patient Instructions (Addendum)
YOU NEED TO HAVE A COVID 19 TEST ON 11-21-2018.Marland Kitchen ONCE YOUR COVID TEST IS COMPLETED, PLEASE BEGIN THE QUARANTINE INSTRUCTIONS AS OUTLINED IN YOUR HANDOUT.                Lisa Parker     Your procedure is scheduled on: 11-24-2018  Report to [facility] Main  Entrance    Report to admitting at 1:45 pm      Call this number if you have problems the morning of surgery [phone]    Remember: Do not eat food or drink liquids :After Midnight. BRUSH YOUR TEETH MORNING OF SURGERY AND RINSE YOUR MOUTH OUT, NO CHEWING GUM CANDY OR MINTS.   Do not eat food After Midnight. YOU MAY HAVE CLEAR LIQUIDS FROM MIDNIGHT UNTIL 1:15PM. At 1:15PM Please finish the prescribed Pre-Surgery ENSURE drink. Nothing by mouth after you finish the ENSURE drink !    CLEAR LIQUID DIET   Foods Allowed                                                                     Foods Excluded  Coffee and tea, regular and decaf                             liquids that you cannot  Plain Jell-O any favor except red or purple                                           see through such as: Fruit ices (not with fruit pulp)                                     milk, soups, orange juice  Iced Popsicles                                    All solid food Carbonated beverages, regular and diet                                    Cranberry, grape and apple juices Sports drinks like Gatorade Lightly seasoned clear broth or consume(fat free) Sugar, honey syrup  Sample Menu Breakfast                                Lunch                                     Supper Cranberry juice                    Beef broth                            Chicken broth  Jell-O                                     Grape juice                           Apple juice Coffee or tea                        Jell-O                                      Popsicle                                                Coffee or tea                        Coffee or  tea  _____________________________________________________________________     Take these medicines the morning of surgery with A SIP OF WATER: escitalopram(lexapro), levothyroxine, Symbicort inhaler as needed (please bring)                                 You may not have any metal on your body including hair pins and              piercings  Do not wear jewelry, make-up, lotions, powders or perfumes, deodorant             Do not wear nail polish.  Do not shave  48 hours prior to surgery.               Do not bring valuables to the hospital. [place].  Contacts, dentures or bridgework may not be worn into surgery.  Leave suitcase in the car. After surgery it may be brought to your room.      _____________________________________________________________________             [facility] - Preparing for Surgery Before surgery, you can play an important role.  Because skin is not sterile, your skin needs to be as free of germs as possible.  You can reduce the number of germs on your skin by washing with CHG (chlorahexidine gluconate) soap before surgery.  CHG is an antiseptic cleaner which kills germs and bonds with the skin to continue killing germs even after washing. Please DO NOT use if you have an allergy to CHG or antibacterial soaps.  If your skin becomes reddened/irritated stop using the CHG and inform your nurse when you arrive at Short Stay. Do not shave (including legs and underarms) for at least 48 hours prior to the first CHG shower.  You may shave your face/neck. Please follow these instructions carefully:  1.  Shower with CHG Soap the night before surgery and the  morning of Surgery.  2.  If you choose to wash your hair, wash your hair first as usual with your  normal  shampoo.  3.  After you shampoo, rinse your  hair and body thoroughly to remove the  shampoo.                           4.  Use CHG as you would any other  liquid soap.  You can apply chg directly  to the skin and wash                       Gently with a scrungie or clean washcloth.  5.  Apply the CHG Soap to your body ONLY FROM THE NECK DOWN.   Do not use on face/ open                           Wound or open sores. Avoid contact with eyes, ears mouth and genitals (private parts).                       Wash face,  Genitals (private parts) with your normal soap.             6.  Wash thoroughly, paying special attention to the area where your surgery  will be performed.  7.  Thoroughly rinse your body with warm water from the neck down.  8.  DO NOT shower/wash with your normal soap after using and rinsing off  the CHG Soap.                9.  Pat yourself dry with a clean towel.            10.  Wear clean pajamas.            11.  Place clean sheets on your bed the night of your first shower and do not  sleep with pets. Day of Surgery : Do not apply any lotions/deodorants the morning of surgery.  Please wear clean clothes to the hospital/surgery center.  FAILURE TO FOLLOW THESE INSTRUCTIONS MAY RESULT IN THE CANCELLATION OF YOUR SURGERY PATIENT SIGNATURE_________________________________  NURSE SIGNATURE__________________________________  ________________________________________________________________________   Lisa Parker  An incentive spirometer is a tool that can help keep your lungs clear and active. This tool measures how well you are filling your lungs with each breath. Taking long deep breaths may help reverse or decrease the chance of developing breathing (pulmonary) problems (especially infection) following:  A long period of time when you are unable to move or be active. BEFORE THE PROCEDURE   If the spirometer includes an indicator to show your best effort, your nurse or respiratory therapist will set it to a desired goal.  If possible, sit up straight or lean slightly forward. Try not to slouch.  Hold the incentive  spirometer in an upright position. INSTRUCTIONS FOR USE  1. Sit on the edge of your bed if possible, or sit up as far as you can in bed or on a chair. 2. Hold the incentive spirometer in an upright position. 3. Breathe out normally. 4. Place the mouthpiece in your mouth and seal your lips tightly around it. 5. Breathe in slowly and as deeply as possible, raising the piston or the ball toward the top of the column. 6. Hold your breath for 3-5 seconds or for as long as possible. Allow the piston or ball to fall to the bottom of the column. 7. Remove the mouthpiece from your mouth and breathe out normally. 8. Rest for a few seconds and repeat  Steps 1 through 7 at least 10 times every 1-2 hours when you are awake. Take your time and take a few normal breaths between deep breaths. 9. The spirometer may include an indicator to show your best effort. Use the indicator as a goal to work toward during each repetition. 10. After each set of 10 deep breaths, practice coughing to be sure your lungs are clear. If you have an incision (the cut made at the time of surgery), support your incision when coughing by placing a pillow or rolled up towels firmly against it. Once you are able to get out of bed, walk around indoors and cough well. You may stop using the incentive spirometer when instructed by your caregiver.  RISKS AND COMPLICATIONS  Take your time so you do not get dizzy or light-headed.  If you are in pain, you may need to take or ask for pain medication before doing incentive spirometry. It is harder to take a deep breath if you are having pain. AFTER USE  Rest and breathe slowly and easily.  It can be helpful to keep track of a log of your progress. Your caregiver can provide you with a simple table to help with this. If you are using the spirometer at home, follow these instructions: [place] IF:   You are having difficultly using the spirometer.  You have trouble using the  spirometer as often as instructed.  Your pain medication is not giving enough relief while using the spirometer.  You develop fever of 100.5 F (38.1 C) or higher. SEEK IMMEDIATE MEDICAL CARE IF:   You cough up bloody sputum that had not been present before.  You develop fever of 102 F (38.9 C) or greater.  You develop worsening pain at or near the incision site. MAKE SURE YOU:   Understand these instructions.  Will watch your condition.  Will get help right away if you are not doing well or get worse. Document Released: 09/03/2006 Document Revised: 07/16/2011 Document Reviewed: 11/04/2006 Exit[employer] Patient Information 2014 [employer], [place].   ________________________________________________________________________  WHAT IS A BLOOD TRANSFUSION? Blood Transfusion Information  A transfusion is the replacement of blood or some of its parts. Blood is made up of multiple cells which provide different functions.  Red blood cells carry oxygen and are used for blood loss replacement.  White blood cells fight against infection.  Platelets control bleeding.  Plasma helps clot blood.  Other blood products are available for specialized needs, such as hemophilia or other clotting disorders. BEFORE THE TRANSFUSION  Who gives blood for transfusions?   Healthy volunteers who are fully evaluated to make sure their blood is safe. This is blood bank blood. Transfusion therapy is the safest it has ever been in the practice of medicine. Before blood is taken from a donor, a complete history is taken to make sure that person has no history of diseases nor engages in risky social behavior (examples are intravenous drug use or sexual activity with multiple partners). The donor's travel history is screened to minimize risk of transmitting infections, such as malaria. The donated blood is tested for signs of infectious diseases, such as HIV and hepatitis. The blood is then tested to be sure it is  compatible with you in order to minimize the chance of a transfusion reaction. If you or a relative donates blood, this is often done in anticipation of surgery and is not appropriate for emergency situations. It takes many days to process the donated blood.  RISKS AND COMPLICATIONS Although transfusion therapy is very safe and saves many lives, the main dangers of transfusion include:   Getting an infectious disease.  Developing a transfusion reaction. This is an allergic reaction to something in the blood you were given. Every precaution is taken to prevent this. The decision to have a blood transfusion has been considered carefully by your caregiver before blood is given. Blood is not given unless the benefits outweigh the risks. AFTER THE TRANSFUSION  Right after receiving a blood transfusion, you will usually feel much better and more energetic. This is especially true if your red blood cells have gotten low (anemic). The transfusion raises the level of the red blood cells which carry oxygen, and this usually causes an energy increase.  The nurse administering the transfusion will monitor you carefully for complications. HOME CARE INSTRUCTIONS  No special instructions are needed after a transfusion. You may find your energy is better. Speak with your caregiver about any limitations on activity for underlying diseases you may have. SEEK MEDICAL CARE IF:   Your condition is not improving after your transfusion.  You develop redness or irritation at the intravenous (IV) site. SEEK IMMEDIATE MEDICAL CARE IF:  Any of the following symptoms occur over the next 12 hours:  Shaking chills.  You have a temperature by mouth above 102 F (38.9 C), not controlled by medicine.  Chest, back, or muscle pain.  People around you feel you are not acting correctly or are confused.  Shortness of breath or difficulty breathing.  Dizziness and fainting.  You get a rash or develop hives.  You have  a decrease in urine output.  Your urine turns a dark color or changes to pink, red, or brown. Any of the following symptoms occur over the next 10 days:  You have a temperature by mouth above 102 F (38.9 C), not controlled by medicine.  Shortness of breath.  Weakness after normal activity.  The white part of the eye turns yellow (jaundice).  You have a decrease in the amount of urine or are urinating less often.  Your urine turns a dark color or changes to pink, red, or brown. Document Released: 04/20/2000 Document Revised: 07/16/2011 Document Reviewed: 12/08/2007 [facility] Patient Information 2014 [place], [place].  _______________________________________________________________________

## 2018-11-21 ENCOUNTER — Encounter (HOSPITAL_COMMUNITY): Payer: Self-pay

## 2018-11-21 ENCOUNTER — Other Ambulatory Visit: Payer: Self-pay

## 2018-11-21 ENCOUNTER — Encounter (HOSPITAL_COMMUNITY)
Admission: RE | Admit: 2018-11-21 | Discharge: 2018-11-21 | Disposition: A | Discharge status: Home / Self Care | Payer: Medicare Other | Source: Ambulatory Visit | Attending: Orthopedic Surgery | Admitting: Orthopedic Surgery

## 2018-11-21 ENCOUNTER — Other Ambulatory Visit (HOSPITAL_COMMUNITY)
Admission: RE | Admit: 2018-11-21 | Discharge: 2018-11-21 | Disposition: A | Discharge status: Home / Self Care | Payer: Medicare Other | Source: Ambulatory Visit | Attending: Orthopedic Surgery | Admitting: Orthopedic Surgery

## 2018-11-21 ENCOUNTER — Ambulatory Visit (HOSPITAL_COMMUNITY)
Admission: RE | Admit: 2018-11-21 | Discharge: 2018-11-21 | Disposition: A | Discharge status: Home / Self Care | Payer: Medicare Other | Source: Ambulatory Visit | Attending: Orthopedic Surgery | Admitting: Orthopedic Surgery

## 2018-11-21 DIAGNOSIS — Z1159 Encounter for screening for other viral diseases: Secondary | ICD-10-CM | POA: Insufficient documentation

## 2018-11-21 DIAGNOSIS — Z01818 Encounter for other preprocedural examination: Secondary | ICD-10-CM | POA: Diagnosis not present

## 2018-11-21 DIAGNOSIS — R001 Bradycardia, unspecified: Secondary | ICD-10-CM | POA: Insufficient documentation

## 2018-11-21 DIAGNOSIS — M1711 Unilateral primary osteoarthritis, right knee: Secondary | ICD-10-CM | POA: Diagnosis not present

## 2018-11-21 DIAGNOSIS — I2721 Secondary pulmonary arterial hypertension: Secondary | ICD-10-CM | POA: Insufficient documentation

## 2018-11-21 HISTORY — DX: Bradycardia, unspecified: R00.1

## 2018-11-21 LAB — CBC WITH DIFFERENTIAL/PLATELET
Abs Immature Granulocytes: 0.01 10*3/uL (ref 0.00–0.07)
Basophils Absolute: 0 10*3/uL (ref 0.0–0.1)
Basophils Relative: 0 %
Eosinophils Absolute: 0.1 10*3/uL (ref 0.0–0.5)
Eosinophils Relative: 2 %
HCT: 45.2 % (ref 36.0–46.0)
Hemoglobin: 14.1 g/dL (ref 12.0–15.0)
Immature Granulocytes: 0 %
Lymphocytes Relative: 16 %
Lymphs Abs: 0.8 10*3/uL (ref 0.7–4.0)
MCH: 29.6 pg (ref 26.0–34.0)
MCHC: 31.2 g/dL (ref 30.0–36.0)
MCV: 94.8 fL (ref 80.0–100.0)
Monocytes Absolute: 0.4 10*3/uL (ref 0.1–1.0)
Monocytes Relative: 9 %
Neutro Abs: 3.5 10*3/uL (ref 1.7–7.7)
Neutrophils Relative %: 73 %
Platelets: 189 10*3/uL (ref 150–400)
RBC: 4.77 MIL/uL (ref 3.87–5.11)
RDW: 13.2 % (ref 11.5–15.5)
WBC: 4.8 10*3/uL (ref 4.0–10.5)
nRBC: 0 % (ref 0.0–0.2)

## 2018-11-21 LAB — BASIC METABOLIC PANEL
Anion gap: 8 (ref 5–15)
BUN: 23 mg/dL (ref 8–23)
CO2: 29 mmol/L (ref 22–32)
Calcium: 9.1 mg/dL (ref 8.9–10.3)
Chloride: 105 mmol/L (ref 98–111)
Creatinine, Ser: 0.9 mg/dL (ref 0.44–1.00)
GFR calc Af Amer: >60 mL/min (ref >60)
GFR calc non Af Amer: >60 mL/min (ref >60)
Glucose, Bld: 89 mg/dL (ref 70–99)
Potassium: 4.4 mmol/L (ref 3.5–5.1)
Sodium: 142 mmol/L (ref 135–145)

## 2018-11-21 LAB — SARS CORONAVIRUS 2 (TAT 6-24 HRS): SARS Coronavirus 2: NEGATIVE

## 2018-11-21 LAB — URINALYSIS, ROUTINE W REFLEX MICROSCOPIC
Bilirubin Urine: NEGATIVE
Glucose, UA: NEGATIVE mg/dL
Hgb urine dipstick: NEGATIVE
Ketones, ur: NEGATIVE mg/dL
Leukocytes,Ua: NEGATIVE
Nitrite: NEGATIVE
Protein, ur: NEGATIVE mg/dL
Specific Gravity, Urine: 1.015 (ref 1.005–1.030)
pH: 5 (ref 5.0–8.0)

## 2018-11-21 LAB — PROTIME-INR
INR: 1 (ref 0.8–1.2)
Prothrombin Time: 12.8 seconds (ref 11.4–15.2)

## 2018-11-21 LAB — SURGICAL PCR SCREEN
MRSA, PCR: NEGATIVE
Staphylococcus aureus: NEGATIVE

## 2018-11-21 LAB — APTT: aPTT: 33 seconds (ref 24–36)

## 2018-11-22 LAB — ABO/RH: ABO/RH(D): A POS

## 2018-11-23 MED ORDER — BUPIVACAINE LIPOSOME 1.3 % IJ SUSP
20.0000 mL | Freq: Once | INTRAMUSCULAR | Status: DC
  Filled 2018-11-23: qty 20

## 2018-11-23 MED ORDER — TRANEXAMIC ACID 1000 MG/10ML IV SOLN
2000.0000 mg | INTRAVENOUS | Status: DC
  Filled 2018-11-23: qty 20

## 2018-11-24 ENCOUNTER — Encounter (HOSPITAL_COMMUNITY)
Admission: RE | Disposition: A | Discharge status: Home / Self Care | Payer: Self-pay | Source: Home / Self Care | Attending: Orthopedic Surgery

## 2018-11-24 ENCOUNTER — Observation Stay (HOSPITAL_COMMUNITY)
Admission: RE | Admit: 2018-11-24 | Discharge: 2018-11-25 | Disposition: A | Discharge status: Home / Self Care | Payer: Medicare Other | Attending: Orthopedic Surgery | Admitting: Orthopedic Surgery

## 2018-11-24 ENCOUNTER — Ambulatory Visit (HOSPITAL_COMMUNITY): Payer: Medicare Other | Admitting: Anesthesiology

## 2018-11-24 ENCOUNTER — Telehealth (HOSPITAL_COMMUNITY): Payer: Self-pay | Admitting: *Deleted

## 2018-11-24 ENCOUNTER — Other Ambulatory Visit: Payer: Self-pay

## 2018-11-24 ENCOUNTER — Encounter (HOSPITAL_COMMUNITY): Payer: Self-pay | Admitting: Certified Registered Nurse Anesthetist

## 2018-11-24 DIAGNOSIS — Z96651 Presence of right artificial knee joint: Secondary | ICD-10-CM

## 2018-11-24 DIAGNOSIS — M1711 Unilateral primary osteoarthritis, right knee: Secondary | ICD-10-CM | POA: Diagnosis not present

## 2018-11-24 DIAGNOSIS — Z7951 Long term (current) use of inhaled steroids: Secondary | ICD-10-CM | POA: Diagnosis not present

## 2018-11-24 DIAGNOSIS — F419 Anxiety disorder, unspecified: Secondary | ICD-10-CM | POA: Insufficient documentation

## 2018-11-24 DIAGNOSIS — Z791 Long term (current) use of non-steroidal anti-inflammatories (NSAID): Secondary | ICD-10-CM | POA: Diagnosis not present

## 2018-11-24 DIAGNOSIS — Z87891 Personal history of nicotine dependence: Secondary | ICD-10-CM | POA: Diagnosis not present

## 2018-11-24 DIAGNOSIS — M25761 Osteophyte, right knee: Secondary | ICD-10-CM | POA: Insufficient documentation

## 2018-11-24 DIAGNOSIS — Z79899 Other long term (current) drug therapy: Secondary | ICD-10-CM | POA: Diagnosis not present

## 2018-11-24 DIAGNOSIS — Z79890 Hormone replacement therapy (postmenopausal): Secondary | ICD-10-CM | POA: Insufficient documentation

## 2018-11-24 DIAGNOSIS — E039 Hypothyroidism, unspecified: Secondary | ICD-10-CM | POA: Diagnosis not present

## 2018-11-24 DIAGNOSIS — G8918 Other acute postprocedural pain: Secondary | ICD-10-CM | POA: Diagnosis not present

## 2018-11-24 DIAGNOSIS — Z9884 Bariatric surgery status: Secondary | ICD-10-CM | POA: Diagnosis not present

## 2018-11-24 DIAGNOSIS — I10 Essential (primary) hypertension: Secondary | ICD-10-CM | POA: Diagnosis not present

## 2018-11-24 DIAGNOSIS — M25561 Pain in right knee: Secondary | ICD-10-CM | POA: Diagnosis present

## 2018-11-24 HISTORY — PX: TOTAL KNEE ARTHROPLASTY: SHX125

## 2018-11-24 LAB — TYPE AND SCREEN
ABO/RH(D): A POS
Antibody Screen: NEGATIVE

## 2018-11-24 SURGERY — ARTHROPLASTY, KNEE, TOTAL
Anesthesia: Spinal | Site: Knee | Laterality: Right

## 2018-11-24 MED ORDER — BUPIVACAINE-EPINEPHRINE (PF) 0.25% -1:200000 IJ SOLN
INTRAMUSCULAR | Status: DC | PRN
  Administered 2018-11-24: 30 mL

## 2018-11-24 MED ORDER — ADULT MULTIVITAMIN W/MINERALS CH
1.0000 | ORAL_TABLET | Freq: Every day | ORAL | Status: DC
  Administered 2018-11-25: 10:00:00 1 via ORAL
  Filled 2018-11-24: qty 1

## 2018-11-24 MED ORDER — DONEPEZIL HCL 10 MG PO TABS
10.0000 mg | ORAL_TABLET | Freq: Every day | ORAL | Status: DC
  Administered 2018-11-24: 10 mg via ORAL
  Filled 2018-11-24: qty 1

## 2018-11-24 MED ORDER — DEXAMETHASONE SODIUM PHOSPHATE 10 MG/ML IJ SOLN
INTRAMUSCULAR | Status: AC
  Filled 2018-11-24: qty 1

## 2018-11-24 MED ORDER — PROPOFOL 10 MG/ML IV BOLUS
INTRAVENOUS | Status: AC
  Filled 2018-11-24: qty 60

## 2018-11-24 MED ORDER — DOCUSATE SODIUM 100 MG PO CAPS
100.0000 mg | ORAL_CAPSULE | Freq: Two times a day (BID) | ORAL | Status: DC
  Administered 2018-11-24 – 2018-11-25 (×2): 100 mg via ORAL
  Filled 2018-11-24 (×2): qty 1

## 2018-11-24 MED ORDER — CHLORHEXIDINE GLUCONATE 4 % EX LIQD: 60.0000 mL | Freq: Once | CUTANEOUS | Status: DC

## 2018-11-24 MED ORDER — PROPOFOL 10 MG/ML IV BOLUS
INTRAVENOUS | Status: AC
  Filled 2018-11-24: qty 20

## 2018-11-24 MED ORDER — DEXAMETHASONE SODIUM PHOSPHATE 10 MG/ML IJ SOLN
INTRAMUSCULAR | Status: DC | PRN
  Administered 2018-11-24: 4 mg via INTRAVENOUS

## 2018-11-24 MED ORDER — ESCITALOPRAM OXALATE 20 MG PO TABS
20.0000 mg | ORAL_TABLET | Freq: Every day | ORAL | Status: DC
  Administered 2018-11-24 – 2018-11-25 (×2): 20 mg via ORAL
  Filled 2018-11-24 (×2): qty 1

## 2018-11-24 MED ORDER — ASPIRIN EC 81 MG PO TBEC: 81.0000 mg | DELAYED_RELEASE_TABLET | Freq: Two times a day (BID) | ORAL | 0 refills | Status: DC

## 2018-11-24 MED ORDER — CEFAZOLIN SODIUM-DEXTROSE 2-4 GM/100ML-% IV SOLN
2.0000 g | INTRAVENOUS | Status: AC
  Administered 2018-11-24: 2 g via INTRAVENOUS
  Filled 2018-11-24: qty 100

## 2018-11-24 MED ORDER — POVIDONE-IODINE 10 % EX SWAB: 2.0000 "application " | Freq: Once | CUTANEOUS | Status: DC

## 2018-11-24 MED ORDER — TRANEXAMIC ACID 1000 MG/10ML IV SOLN
INTRAVENOUS | Status: DC | PRN
  Administered 2018-11-24: 2000 mg via TOPICAL

## 2018-11-24 MED ORDER — POLYETHYLENE GLYCOL 3350 17 G PO PACK: 17.0000 g | PACK | Freq: Every day | ORAL | Status: DC | PRN

## 2018-11-24 MED ORDER — OXYCODONE-ACETAMINOPHEN 5-325 MG PO TABS: 1.0000 | ORAL_TABLET | ORAL | 0 refills | Status: DC | PRN

## 2018-11-24 MED ORDER — PHENOL 1.4 % MT LIQD: 1.0000 | OROMUCOSAL | Status: DC | PRN

## 2018-11-24 MED ORDER — BISACODYL 5 MG PO TBEC: 5.0000 mg | DELAYED_RELEASE_TABLET | Freq: Every day | ORAL | Status: DC | PRN

## 2018-11-24 MED ORDER — ASPIRIN 81 MG PO CHEW
81.0000 mg | CHEWABLE_TABLET | Freq: Two times a day (BID) | ORAL | Status: DC
  Administered 2018-11-24 – 2018-11-25 (×2): 81 mg via ORAL
  Filled 2018-11-24 (×3): qty 1

## 2018-11-24 MED ORDER — PROPOFOL 500 MG/50ML IV EMUL
INTRAVENOUS | Status: DC | PRN
  Administered 2018-11-24: 135 ug/kg/min via INTRAVENOUS

## 2018-11-24 MED ORDER — DIPHENHYDRAMINE HCL 12.5 MG/5ML PO ELIX: 12.5000 mg | ORAL_SOLUTION | ORAL | Status: DC | PRN

## 2018-11-24 MED ORDER — PHENYLEPHRINE HCL (PRESSORS) 10 MG/ML IV SOLN
INTRAVENOUS | Status: AC
  Filled 2018-11-24: qty 1

## 2018-11-24 MED ORDER — LEVOTHYROXINE SODIUM 75 MCG PO TABS
150.0000 ug | ORAL_TABLET | Freq: Every day | ORAL | Status: DC
  Administered 2018-11-25: 150 ug via ORAL
  Filled 2018-11-24: qty 2

## 2018-11-24 MED ORDER — FENTANYL CITRATE (PF) 100 MCG/2ML IJ SOLN: 25.0000 ug | INTRAMUSCULAR | Status: DC | PRN

## 2018-11-24 MED ORDER — ALUM & MAG HYDROXIDE-SIMETH 200-200-20 MG/5ML PO SUSP: 30.0000 mL | ORAL | Status: DC | PRN

## 2018-11-24 MED ORDER — BUPIVACAINE IN DEXTROSE 0.75-8.25 % IT SOLN
INTRATHECAL | Status: DC | PRN
  Administered 2018-11-24: 1.6 mL via INTRATHECAL

## 2018-11-24 MED ORDER — BUPROPION HCL ER (XL) 150 MG PO TB24
150.0000 mg | ORAL_TABLET | Freq: Every day | ORAL | Status: DC
  Administered 2018-11-24 – 2018-11-25 (×2): 150 mg via ORAL
  Filled 2018-11-24 (×2): qty 1

## 2018-11-24 MED ORDER — VITAMIN D 25 MCG (1000 UNIT) PO TABS
2000.0000 [IU] | ORAL_TABLET | Freq: Every day | ORAL | Status: DC
  Administered 2018-11-25: 2000 [IU] via ORAL
  Filled 2018-11-24: qty 2

## 2018-11-24 MED ORDER — MEMANTINE HCL 10 MG PO TABS
10.0000 mg | ORAL_TABLET | Freq: Two times a day (BID) | ORAL | Status: DC
  Administered 2018-11-24 – 2018-11-25 (×2): 10 mg via ORAL
  Filled 2018-11-24 (×2): qty 1

## 2018-11-24 MED ORDER — PROPOFOL 10 MG/ML IV BOLUS
INTRAVENOUS | Status: DC | PRN
  Administered 2018-11-24: 20 mg via INTRAVENOUS
  Administered 2018-11-24: 10 mg via INTRAVENOUS

## 2018-11-24 MED ORDER — ONDANSETRON HCL 4 MG/2ML IJ SOLN
INTRAMUSCULAR | Status: AC
  Filled 2018-11-24: qty 2

## 2018-11-24 MED ORDER — ONDANSETRON HCL 4 MG/2ML IJ SOLN: 4.0000 mg | Freq: Once | INTRAMUSCULAR | Status: DC | PRN

## 2018-11-24 MED ORDER — METOCLOPRAMIDE HCL 5 MG/ML IJ SOLN: 5.0000 mg | Freq: Three times a day (TID) | INTRAMUSCULAR | Status: DC | PRN

## 2018-11-24 MED ORDER — FENTANYL CITRATE (PF) 100 MCG/2ML IJ SOLN
50.0000 ug | INTRAMUSCULAR | Status: DC
  Administered 2018-11-24: 14:00:00 50 ug via INTRAVENOUS
  Filled 2018-11-24: qty 2

## 2018-11-24 MED ORDER — SODIUM CHLORIDE 0.9 % IR SOLN
Status: DC | PRN
  Administered 2018-11-24: 1000 mL

## 2018-11-24 MED ORDER — ONDANSETRON HCL 4 MG PO TABS: 4.0000 mg | ORAL_TABLET | Freq: Four times a day (QID) | ORAL | Status: DC | PRN

## 2018-11-24 MED ORDER — CITALOPRAM HYDROBROMIDE 20 MG PO TABS
40.0000 mg | ORAL_TABLET | Freq: Every day | ORAL | Status: DC
  Administered 2018-11-24 – 2018-11-25 (×2): 40 mg via ORAL
  Filled 2018-11-24 (×2): qty 2

## 2018-11-24 MED ORDER — CELECOXIB 200 MG PO CAPS
200.0000 mg | ORAL_CAPSULE | Freq: Two times a day (BID) | ORAL | Status: DC
  Administered 2018-11-24 – 2018-11-25 (×2): 200 mg via ORAL
  Filled 2018-11-24 (×2): qty 1

## 2018-11-24 MED ORDER — LIDOCAINE 2% (20 MG/ML) 5 ML SYRINGE
INTRAMUSCULAR | Status: AC
  Filled 2018-11-24: qty 5

## 2018-11-24 MED ORDER — OXYCODONE HCL 5 MG/5ML PO SOLN: 5.0000 mg | Freq: Once | ORAL | Status: DC | PRN

## 2018-11-24 MED ORDER — TIZANIDINE HCL 2 MG PO TABS: 2.0000 mg | ORAL_TABLET | Freq: Four times a day (QID) | ORAL | 0 refills | Status: DC | PRN

## 2018-11-24 MED ORDER — ACETAMINOPHEN 500 MG PO TABS
1000.0000 mg | ORAL_TABLET | Freq: Once | ORAL | Status: AC
  Administered 2018-11-24: 1000 mg via ORAL
  Filled 2018-11-24: qty 2

## 2018-11-24 MED ORDER — MIDAZOLAM HCL 2 MG/2ML IJ SOLN
1.0000 mg | INTRAMUSCULAR | Status: DC
  Filled 2018-11-24: qty 2

## 2018-11-24 MED ORDER — SODIUM CHLORIDE 0.9 % IV SOLN
INTRAVENOUS | Status: DC | PRN
  Administered 2018-11-24: 15:00:00 25 ug/min via INTRAVENOUS

## 2018-11-24 MED ORDER — EPHEDRINE SULFATE-NACL 50-0.9 MG/10ML-% IV SOSY
PREFILLED_SYRINGE | INTRAVENOUS | Status: DC | PRN
  Administered 2018-11-24: 10 mg via INTRAVENOUS
  Administered 2018-11-24: 5 mg via INTRAVENOUS

## 2018-11-24 MED ORDER — OXYCODONE HCL 5 MG PO TABS
5.0000 mg | ORAL_TABLET | ORAL | Status: DC | PRN
  Administered 2018-11-24 – 2018-11-25 (×2): 10 mg via ORAL
  Administered 2018-11-25: 5 mg via ORAL
  Filled 2018-11-24: qty 1
  Filled 2018-11-24 (×2): qty 2

## 2018-11-24 MED ORDER — HYDROMORPHONE HCL 1 MG/ML IJ SOLN: 0.5000 mg | INTRAMUSCULAR | Status: DC | PRN

## 2018-11-24 MED ORDER — METOCLOPRAMIDE HCL 5 MG PO TABS: 5.0000 mg | ORAL_TABLET | Freq: Three times a day (TID) | ORAL | Status: DC | PRN

## 2018-11-24 MED ORDER — BUPIVACAINE LIPOSOME 1.3 % IJ SUSP
INTRAMUSCULAR | Status: DC | PRN
  Administered 2018-11-24: 20 mL

## 2018-11-24 MED ORDER — MOMETASONE FURO-FORMOTEROL FUM 100-5 MCG/ACT IN AERO
2.0000 | INHALATION_SPRAY | Freq: Two times a day (BID) | RESPIRATORY_TRACT | Status: DC
  Administered 2018-11-25: 2 via RESPIRATORY_TRACT
  Filled 2018-11-24: qty 8.8

## 2018-11-24 MED ORDER — OXYCODONE HCL 5 MG PO TABS: 5.0000 mg | ORAL_TABLET | Freq: Once | ORAL | Status: DC | PRN

## 2018-11-24 MED ORDER — TRANEXAMIC ACID-NACL 1000-0.7 MG/100ML-% IV SOLN
1000.0000 mg | Freq: Once | INTRAVENOUS | Status: AC
  Administered 2018-11-24: 1000 mg via INTRAVENOUS
  Filled 2018-11-24: qty 100

## 2018-11-24 MED ORDER — PANTOPRAZOLE SODIUM 40 MG PO TBEC
40.0000 mg | DELAYED_RELEASE_TABLET | Freq: Every day | ORAL | Status: DC
  Administered 2018-11-24 – 2018-11-25 (×2): 40 mg via ORAL
  Filled 2018-11-24 (×2): qty 1

## 2018-11-24 MED ORDER — EPHEDRINE 5 MG/ML INJ
INTRAVENOUS | Status: AC
  Filled 2018-11-24: qty 10

## 2018-11-24 MED ORDER — FLEET ENEMA 7-19 GM/118ML RE ENEM: 1.0000 | ENEMA | Freq: Once | RECTAL | Status: DC | PRN

## 2018-11-24 MED ORDER — ONDANSETRON HCL 4 MG/2ML IJ SOLN
INTRAMUSCULAR | Status: DC | PRN
  Administered 2018-11-24: 4 mg via INTRAVENOUS

## 2018-11-24 MED ORDER — METHOCARBAMOL 500 MG PO TABS
500.0000 mg | ORAL_TABLET | Freq: Four times a day (QID) | ORAL | Status: DC | PRN
  Administered 2018-11-24 – 2018-11-25 (×2): 500 mg via ORAL
  Filled 2018-11-24 (×2): qty 1

## 2018-11-24 MED ORDER — SODIUM CHLORIDE (PF) 0.9 % IJ SOLN
INTRAMUSCULAR | Status: DC | PRN
  Administered 2018-11-24: 70 mL

## 2018-11-24 MED ORDER — ACETAMINOPHEN 325 MG PO TABS
325.0000 mg | ORAL_TABLET | Freq: Four times a day (QID) | ORAL | Status: DC | PRN
  Administered 2018-11-25: 650 mg via ORAL
  Filled 2018-11-24: qty 2

## 2018-11-24 MED ORDER — BUPIVACAINE-EPINEPHRINE (PF) 0.25% -1:200000 IJ SOLN
INTRAMUSCULAR | Status: AC
  Filled 2018-11-24: qty 30

## 2018-11-24 MED ORDER — MENTHOL 3 MG MT LOZG: 1.0000 | LOZENGE | OROMUCOSAL | Status: DC | PRN

## 2018-11-24 MED ORDER — ALPRAZOLAM 0.5 MG PO TABS
0.5000 mg | ORAL_TABLET | Freq: Every day | ORAL | Status: DC
  Administered 2018-11-24: 0.5 mg via ORAL
  Filled 2018-11-24: qty 1

## 2018-11-24 MED ORDER — ZOLPIDEM TARTRATE 5 MG PO TABS: 5.0000 mg | ORAL_TABLET | Freq: Every evening | ORAL | Status: DC | PRN

## 2018-11-24 MED ORDER — METHOCARBAMOL 500 MG IVPB - SIMPLE MED
500.0000 mg | Freq: Four times a day (QID) | INTRAVENOUS | Status: DC | PRN
  Filled 2018-11-24: qty 50

## 2018-11-24 MED ORDER — ONDANSETRON HCL 4 MG/2ML IJ SOLN: 4.0000 mg | Freq: Four times a day (QID) | INTRAMUSCULAR | Status: DC | PRN

## 2018-11-24 MED ORDER — LACTATED RINGERS IV SOLN
INTRAVENOUS | Status: DC
  Administered 2018-11-24: 13:00:00 via INTRAVENOUS

## 2018-11-24 MED ORDER — SODIUM CHLORIDE (PF) 0.9 % IJ SOLN
INTRAMUSCULAR | Status: AC
  Filled 2018-11-24: qty 100

## 2018-11-24 MED ORDER — ROPIVACAINE HCL 7.5 MG/ML IJ SOLN
INTRAMUSCULAR | Status: DC | PRN
  Administered 2018-11-24: 20 mL via PERINEURAL

## 2018-11-24 MED ORDER — GABAPENTIN 300 MG PO CAPS
300.0000 mg | ORAL_CAPSULE | Freq: Three times a day (TID) | ORAL | Status: DC
  Administered 2018-11-24 – 2018-11-25 (×4): 300 mg via ORAL
  Filled 2018-11-24 (×4): qty 1

## 2018-11-24 MED ORDER — TRANEXAMIC ACID-NACL 1000-0.7 MG/100ML-% IV SOLN
1000.0000 mg | INTRAVENOUS | Status: AC
  Administered 2018-11-24: 15:00:00 1000 mg via INTRAVENOUS
  Filled 2018-11-24: qty 100

## 2018-11-24 MED ORDER — KCL IN DEXTROSE-NACL 20-5-0.45 MEQ/L-%-% IV SOLN
INTRAVENOUS | Status: DC
  Administered 2018-11-24 – 2018-11-25 (×2): via INTRAVENOUS
  Filled 2018-11-24 (×3): qty 1000

## 2018-11-24 SURGICAL SUPPLY — 60 items
ATTUNE PS FEM RT SZ 5 CEM KNEE (Femur) ×2 IMPLANT
ATTUNE PSRP INSR SZ5 6 KNEE (Insert) ×1 IMPLANT
ATTUNE PSRP INSR SZ5 6MM KNEE (Insert) ×1 IMPLANT
BAG DECANTER FOR FLEXI CONT (MISCELLANEOUS) ×5 IMPLANT
BAG SPEC THK2 15X12 ZIP CLS (MISCELLANEOUS) ×1
BAG ZIPLOCK 12X15 (MISCELLANEOUS) ×3 IMPLANT
BASE TIBIA ATTUNE KNEE SYS SZ6 (Knees) IMPLANT
BLADE SAG 18X100X1.27 (BLADE) ×3 IMPLANT
BLADE SAW SGTL 11.0X1.19X90.0M (BLADE) ×3 IMPLANT
BLADE SURG SZ10 CARB STEEL (BLADE) ×6 IMPLANT
BNDG CMPR MED 10X6 ELC LF (GAUZE/BANDAGES/DRESSINGS) ×1
BNDG CMPR MED 15X6 ELC VLCR LF (GAUZE/BANDAGES/DRESSINGS) ×1
BNDG ELASTIC 6X10 VLCR STRL LF (GAUZE/BANDAGES/DRESSINGS) ×3 IMPLANT
BNDG ELASTIC 6X15 VLCR STRL LF (GAUZE/BANDAGES/DRESSINGS) ×2 IMPLANT
BOWL SMART MIX CTS (DISPOSABLE) ×3 IMPLANT
BSPLAT TIB 6 CMNT ROT PLAT STR (Knees) ×1 IMPLANT
CEMENT HV SMART SET (Cement) ×6 IMPLANT
COVER SURGICAL LIGHT HANDLE (MISCELLANEOUS) ×3 IMPLANT
COVER WAND RF STERILE (DRAPES) IMPLANT
CUFF TOURN SGL QUICK 34 (TOURNIQUET CUFF) ×3
CUFF TRNQT CYL 34X4.125X (TOURNIQUET CUFF) ×1 IMPLANT
DECANTER SPIKE VIAL GLASS SM (MISCELLANEOUS) ×5 IMPLANT
DRAPE U-SHAPE 47X51 STRL (DRAPES) ×3 IMPLANT
DRESSING AQUACEL AG SP 3.5X10 (GAUZE/BANDAGES/DRESSINGS) IMPLANT
DRSG AQUACEL AG ADV 3.5X10 (GAUZE/BANDAGES/DRESSINGS) ×3 IMPLANT
DRSG AQUACEL AG SP 3.5X10 (GAUZE/BANDAGES/DRESSINGS) ×3
DURAPREP 26ML APPLICATOR (WOUND CARE) ×3 IMPLANT
ELECT REM PT RETURN 15FT ADLT (MISCELLANEOUS) ×3 IMPLANT
GLOVE BIO SURGEON STRL SZ7.5 (GLOVE) ×3 IMPLANT
GLOVE BIO SURGEON STRL SZ8.5 (GLOVE) ×3 IMPLANT
GLOVE BIOGEL PI IND STRL 8 (GLOVE) ×1 IMPLANT
GLOVE BIOGEL PI IND STRL 9 (GLOVE) ×1 IMPLANT
GLOVE BIOGEL PI INDICATOR 8 (GLOVE) ×2
GLOVE BIOGEL PI INDICATOR 9 (GLOVE) ×2
GOWN STRL REUS W/TWL XL LVL3 (GOWN DISPOSABLE) ×6 IMPLANT
HANDPIECE INTERPULSE COAX TIP (DISPOSABLE) ×3
HOOD PEEL AWAY FLYTE STAYCOOL (MISCELLANEOUS) ×7 IMPLANT
KIT TURNOVER KIT A (KITS) IMPLANT
NDL HYPO 21X1.5 SAFETY (NEEDLE) ×2 IMPLANT
NEEDLE HYPO 21X1.5 SAFETY (NEEDLE) ×6 IMPLANT
NS IRRIG 1000ML POUR BTL (IV SOLUTION) ×3 IMPLANT
PACK ICE MAXI GEL EZY WRAP (MISCELLANEOUS) ×3 IMPLANT
PACK TOTAL KNEE CUSTOM (KITS) ×3 IMPLANT
PATELLA MEDIAL ATTUN 35MM KNEE (Knees) ×2 IMPLANT
PIN STEINMAN FIXATION KNEE (PIN) ×2 IMPLANT
PROTECTOR NERVE ULNAR (MISCELLANEOUS) ×3 IMPLANT
SET HNDPC FAN SPRY TIP SCT (DISPOSABLE) ×1 IMPLANT
STAPLER VISISTAT 35W (STAPLE) IMPLANT
SUT VIC AB 1 CTX 36 (SUTURE) ×3
SUT VIC AB 1 CTX36XBRD ANBCTR (SUTURE) ×1 IMPLANT
SUT VIC AB 2-0 CT1 27 (SUTURE) ×3
SUT VIC AB 2-0 CT1 TAPERPNT 27 (SUTURE) ×1 IMPLANT
SUT VIC AB 3-0 CT1 27 (SUTURE) ×3
SUT VIC AB 3-0 CT1 TAPERPNT 27 (SUTURE) ×1 IMPLANT
SYR CONTROL 10ML LL (SYRINGE) ×6 IMPLANT
TIBIA ATTUNE KNEE SYS BASE SZ6 (Knees) ×3 IMPLANT
TRAY FOLEY MTR SLVR 16FR STAT (SET/KITS/TRAYS/PACK) ×3 IMPLANT
WATER STERILE IRR 1000ML POUR (IV SOLUTION) ×6 IMPLANT
WRAP KNEE MAXI GEL POST OP (GAUZE/BANDAGES/DRESSINGS) ×2 IMPLANT
YANKAUER SUCT BULB TIP 10FT TU (MISCELLANEOUS) ×3 IMPLANT

## 2018-11-24 NOTE — Progress Notes (Signed)
Assisted Dr. Brock with right, ultrasound guided, adductor canal block. Side rails up, monitors on throughout procedure. See vital signs in flow sheet. Tolerated Procedure well.  

## 2018-11-24 NOTE — Anesthesia Preprocedure Evaluation (Addendum)
Anesthesia Evaluation  Patient identified by MRN, date of birth, ID band Patient awake    Reviewed: Allergy & Precautions, NPO status , Patient's Chart, lab work & pertinent test results  History of Anesthesia Complications Negative for: history of anesthetic complications  Airway Mallampati: II  TM Distance: >3 FB Neck ROM: Full    Dental  (+) Dental Advisory Given, Partial Upper   Pulmonary asthma , former smoker,    breath sounds clear to auscultation       Cardiovascular hypertension (no longer on meds),  Rhythm:Regular Rate:Normal     Neuro/Psych PSYCHIATRIC DISORDERS Anxiety negative neurological ROS     GI/Hepatic Neg liver ROS,  S/p gastric sleeve    Endo/Other  Hypothyroidism   Renal/GU negative Renal ROS     Musculoskeletal  (+) Arthritis ,   Abdominal   Peds  Hematology negative hematology ROS (+)   Anesthesia Other Findings   Reproductive/Obstetrics                            Anesthesia Physical Anesthesia Plan  ASA: II  Anesthesia Plan: Spinal   Post-op Pain Management:  Regional for Post-op pain   Induction:   PONV Risk Score and Plan: 2 and Treatment may vary due to age or medical condition and Propofol infusion  Airway Management Planned: Natural Airway and Simple Face Mask  Additional Equipment: None  Intra-op Plan:   Post-operative Plan:   Informed Consent: I have reviewed the patients History and Physical, chart, labs and discussed the procedure including the risks, benefits and alternatives for the proposed anesthesia with the patient or authorized representative who has indicated his/her understanding and acceptance.       Plan Discussed with: CRNA and Anesthesiologist  Anesthesia Plan Comments: (Labs reviewed, platelets acceptable. Discussed risks and benefits of spinal, including spinal/epidural hematoma, infection, failed block, and PDPH.  Patient expressed understanding and wished to proceed. )       Anesthesia Quick Evaluation

## 2018-11-24 NOTE — Anesthesia Procedure Notes (Signed)
Anesthesia Regional Block: Adductor canal block   Pre-Anesthetic Checklist: ,, timeout performed, Correct Patient, Correct Site, Correct Laterality, Correct Procedure, Correct Position, site marked, Risks and benefits discussed,  Surgical consent,  Pre-op evaluation,  At surgeon's request and post-op pain management  Laterality: Right  Prep: chloraprep       Needles:  Injection technique: Single-shot  Needle Type: Echogenic Needle     Needle Length: 10cm  Needle Gauge: 21     Additional Needles:   Narrative:  Start time: 11/24/2018 1:54 PM End time: 11/24/2018 1:57 PM Injection made incrementally with aspirations every 5 mL.  Performed by: Personally  Anesthesiologist: Audry Pili, MD  Additional Notes: No pain on injection. No increased resistance to injection. Injection made in 5cc increments. Good needle visualization. Patient tolerated the procedure well.

## 2018-11-24 NOTE — Anesthesia Procedure Notes (Signed)
Procedure Name: MAC Date/Time: 11/24/2018 2:27 PM Performed by: Niel Hummer, CRNA Pre-anesthesia Checklist: Patient identified, Emergency Drugs available, Suction available and Patient being monitored Patient Re-evaluated:Patient Re-evaluated prior to induction Oxygen Delivery Method: Simple face mask

## 2018-11-24 NOTE — Transfer of Care (Signed)
Immediate Anesthesia Transfer of Care Note  Patient: Lisa Parker  Procedure(s) Performed: Right Knee Arthroplasty (Right Knee)  Patient Location: PACU  Anesthesia Type:Spinal  Level of Consciousness: awake, alert  and oriented  Airway & Oxygen Therapy: Patient Spontanous Breathing and Patient connected to face mask oxygen  Post-op Assessment: Report given to RN and Post -op Vital signs reviewed and stable  Post vital signs: Reviewed and stable  Last Vitals:  Vitals Value Taken Time  BP    Temp    Pulse    Resp    SpO2      Last Pain:  Vitals:   11/24/18 1415  TempSrc:   PainSc: (P) 0-No pain         Complications: No apparent anesthesia complications

## 2018-11-24 NOTE — Interval H&P Note (Signed)
History and Physical Interval Note:  11/24/2018 1:35 PM  Lisa Parker  has presented today for surgery, with the diagnosis of Right Knee Osteoarthritis.  The various methods of treatment have been discussed with the patient and family. After consideration of risks, benefits and other options for treatment, the patient has consented to  Procedure(s): Right Knee Arthroplasty (Right) as a surgical intervention.  The patient's history has been reviewed, patient examined, no change in status, stable for surgery.  I have reviewed the patient's chart and labs.  Questions were answered to the patient's satisfaction.     [name]

## 2018-11-24 NOTE — Discharge Instructions (Signed)

## 2018-11-24 NOTE — Anesthesia Procedure Notes (Signed)
Spinal  Patient location during procedure: OR Start time: 11/24/2018 2:35 PM End time: 11/24/2018 2:41 PM Staffing Anesthesiologist: Audry Pili, MD Performed: anesthesiologist  Preanesthetic Checklist Completed: patient identified, surgical consent, pre-op evaluation, timeout performed, IV checked, risks and benefits discussed and monitors and equipment checked Spinal Block Patient position: sitting Prep: DuraPrep Patient monitoring: heart rate, cardiac monitor, continuous pulse ox and blood pressure Approach: midline Location: L3-4 Injection technique: single-shot Needle Needle type: Quincke  Needle gauge: 22 G Additional Notes Consent was obtained prior to the procedure with all questions answered and concerns addressed. Risks including, but not limited to, bleeding, infection, nerve damage, paralysis, failed block, inadequate analgesia, allergic reaction, high spinal, itching, and headache were discussed and the patient wished to proceed. Functioning IV was confirmed and monitors were applied. Sterile prep and drape, including hand hygiene, mask, and sterile gloves were used. The patient was positioned and the spine was prepped. The skin was anesthetized with lidocaine. Free flow of clear CSF was obtained prior to injecting local anesthetic into the CSF. The spinal needle aspirated freely following injection. The needle was carefully withdrawn. The patient tolerated the procedure well.   Renold Don, MD

## 2018-11-24 NOTE — Op Note (Signed)
PATIENT ID:      Lisa Parker  MRN:     [id number] DOB/AGE:    Nov 01, 1945 / 73 y.o.       OPERATIVE REPORT    DATE OF PROCEDURE:  11/24/2018       PREOPERATIVE DIAGNOSIS:   Right Knee Osteoarthritis      Estimated body mass index is 29.8 kg/m as calculated from the following:   Height as of 11/21/18: 5\' 4"  (1.626 m).   Weight as of 11/21/18: 78.7 kg.                                                        POSTOPERATIVE DIAGNOSIS:   Right Knee Osteoarthritis                                                                      PROCEDURE:  Procedure(s): Right Knee Arthroplasty Using DepuyAttune RP implants #5R Femur, #6Tibia, 6 mm Attune RP bearing, 35 Patella     SURGEON: [name]    ASSISTANT:   Kerry Hough. [employer]   (Present and scrubbed throughout the case, critical for assistance with exposure, retraction, instrumentation, and closure.)         ANESTHESIA: Spinal, 20cc Exparel, 50cc 0.25% Marcaine  EBL: 400 cc  FLUID REPLACEMENT: 1600 cc crystaloid  Tourniquet Time: None  Drains: None  Tranexamic Acid: 1gm IV, 2gm topical  Exparel: 266mg    COMPLICATIONS:  None         INDICATIONS FOR PROCEDURE: The patient has  Right Knee Osteoarthritis, Val deformities, XR shows bone on bone arthritis, lateral subluxation of tibia. Patient has failed all conservative measures including anti-inflammatory medicines, narcotics, attempts at exercise and weight loss, cortisone injections and viscosupplementation.  Risks and benefits of surgery have been discussed, questions answered.   DESCRIPTION OF PROCEDURE: The patient identified by armband, received  IV antibiotics, in the holding area at [facility]. Patient taken to the operating room, appropriate anesthetic monitors were attached, and Spinal anesthesia was  induced. IV Tranexamic acid was given.Tourniquet applied high to the operative thigh. Lateral post and foot positioner applied to the table, the lower extremity was then prepped  and draped in usual sterile fashion from the toes to the tourniquet. Time-out procedure was performed. The skin and subcutaneous tissue along the incision was injected with 20 cc of a mixture of Exparel and Marcaine solution, using a 20-gauge by 1-1/2 inch needle. We began the operation, with the knee flexed 130 degrees, by making the anterior midline incision starting at handbreadth above the patella going over the patella 1 cm medial to and 4 cm distal to the tibial tubercle. Small bleeders in the skin and the subcutaneous tissue identified and cauterized. Transverse retinaculum was incised and reflected medially and a medial parapatellar arthrotomy was accomplished. the patella was everted and theprepatellar fat pad resected. The superficial medial collateral ligament was then elevated from anterior to posterior along the proximal flare of the tibia and anterior half of the menisci resected. The knee was hyperflexed exposing bone on bone  arthritis. Peripheral and notch osteophytes as well as the cruciate ligaments were then resected. We continued to work our way around posteriorly along the proximal tibia, and externally rotated the tibia subluxing it out from underneath the femur. A McHale retractor was placed through the notch and a lateral Hohmann retractor placed, and we then drilled through the proximal tibia in line with the axis of the tibia followed by an intramedullary guide rod and 2-degree posterior slope cutting guide. The tibial cutting guide, 4 degree posterior sloped, was pinned into place allowing resection of 8 mm of bone medially and 0 mm of bone laterally. Satisfied with the tibial resection, we then entered the distal femur 2 mm anterior to the PCL origin with the intramedullary guide rod and applied the distal femoral cutting guide set at 9 mm, with 5 degrees of valgus. This was pinned along the epicondylar axis. At this point, the distal femoral cut was accomplished without difficulty. We  then sized for a #5 femoral component and pinned the guide in 0 degrees of external rotation. The chamfer cutting guide was pinned into place. The anterior, posterior, and chamfer cuts were accomplished without difficulty followed by the Attune RP box cutting guide and the box cut. We also removed posterior osteophytes from the posterior femoral condyles. The posterior capsule was injected with Exparel solution. The knee was brought into full extension. We checked our extension gap and fit a 5 mm bearing. Distracting in extension with a lamina spreader,  bleeders in the posterior capsule, Posterior medial and posterior lateral right down and cauterized.  The transexamic acid-soaked sponge was then placed in the gap of the knee and extension. The knee was flexed 30. The posterior patella cut was accomplished with the 9.5 mm Attune cutting guide, sized for a 18mm dome, and the fixation pegs drilled.The knee was then once again hyperflexed exposing the proximal tibia. We sized for a # 6 tibial base plate, applied the smokestack and the conical reamer followed by the the Delta fin keel punch. We then hammered into place the Attune RP trial femoral component, drilled the lugs, inserted a  6 mm trial bearing, trial patellar button, and took the knee through range of motion from 0-130 degrees. Medial and lateral ligamentous stability was checked. No thumb pressure was required for patellar Tracking. The tourniquet was inflated to 350 mmHg after first exsanguinating the limb with an Esmarch bandage and flexing the knee to 90 degrees.  Tourniquet was left up for 8 minutes while the cement hardened.. All trial components were removed, mating surfaces irrigated with pulse lavage, and dried with suction and sponges. 10 cc of the Exparel solution was applied to the cancellus bone of the patella distal femur and proximal tibia.  After waiting 30 seconds, the bony surfaces were again, dried with sponges. A double batch of DePuy  HV cement was mixed and applied to all bony metallic mating surfaces except for the posterior condyles of the femur itself. In order, we hammered into place the tibial tray and removed excess cement, the femoral component and removed excess cement. The final Attune RP bearing was inserted, and the knee brought to full extension with compression. The patellar button was clamped into place, and excess cement removed. The knee was held at 30 flexion with compression, while the cement cured. The wound was irrigated out with normal saline solution pulse lavage. The rest of the Exparel was injected into the parapatellar arthrotomy, subcutaneous tissues, and periosteal tissues. The parapatellar arthrotomy  was closed with running #1 Vicryl suture. The subcutaneous tissue with 0 and 2-0 undyed Vicryl suture, and the skin with running 3-0 SQ vicryl. An Aquacil and Ace wrap were applied. The patient was taken to recovery room without difficulty.   [name] 11/24/2018, 3:49 PM

## 2018-11-25 DIAGNOSIS — Z96651 Presence of right artificial knee joint: Secondary | ICD-10-CM | POA: Diagnosis not present

## 2018-11-25 DIAGNOSIS — M25761 Osteophyte, right knee: Secondary | ICD-10-CM | POA: Diagnosis not present

## 2018-11-25 DIAGNOSIS — Z791 Long term (current) use of non-steroidal anti-inflammatories (NSAID): Secondary | ICD-10-CM | POA: Diagnosis not present

## 2018-11-25 DIAGNOSIS — M1711 Unilateral primary osteoarthritis, right knee: Secondary | ICD-10-CM | POA: Diagnosis not present

## 2018-11-25 DIAGNOSIS — Z7951 Long term (current) use of inhaled steroids: Secondary | ICD-10-CM | POA: Diagnosis not present

## 2018-11-25 DIAGNOSIS — Z87891 Personal history of nicotine dependence: Secondary | ICD-10-CM | POA: Diagnosis not present

## 2018-11-25 DIAGNOSIS — I10 Essential (primary) hypertension: Secondary | ICD-10-CM | POA: Diagnosis not present

## 2018-11-25 DIAGNOSIS — Z9884 Bariatric surgery status: Secondary | ICD-10-CM | POA: Diagnosis not present

## 2018-11-25 DIAGNOSIS — Z79899 Other long term (current) drug therapy: Secondary | ICD-10-CM | POA: Diagnosis not present

## 2018-11-25 DIAGNOSIS — E039 Hypothyroidism, unspecified: Secondary | ICD-10-CM | POA: Diagnosis not present

## 2018-11-25 LAB — BASIC METABOLIC PANEL
Anion gap: 10 (ref 5–15)
BUN: 14 mg/dL (ref 8–23)
CO2: 24 mmol/L (ref 22–32)
Calcium: 8.5 mg/dL — ABNORMAL LOW (ref 8.9–10.3)
Chloride: 105 mmol/L (ref 98–111)
Creatinine, Ser: 0.74 mg/dL (ref 0.44–1.00)
GFR calc Af Amer: >60 mL/min (ref >60)
GFR calc non Af Amer: >60 mL/min (ref >60)
Glucose, Bld: 174 mg/dL — ABNORMAL HIGH (ref 70–99)
Potassium: 4.5 mmol/L (ref 3.5–5.1)
Sodium: 139 mmol/L (ref 135–145)

## 2018-11-25 LAB — CBC
HCT: 40.7 % (ref 36.0–46.0)
Hemoglobin: 12.4 g/dL (ref 12.0–15.0)
MCH: 29.5 pg (ref 26.0–34.0)
MCHC: 30.5 g/dL (ref 30.0–36.0)
MCV: 96.7 fL (ref 80.0–100.0)
Platelets: 151 10*3/uL (ref 150–400)
RBC: 4.21 MIL/uL (ref 3.87–5.11)
RDW: 13.3 % (ref 11.5–15.5)
WBC: 7.8 10*3/uL (ref 4.0–10.5)
nRBC: 0 % (ref 0.0–0.2)

## 2018-11-25 NOTE — Plan of Care (Signed)
  Problem: Education: Goal: Knowledge of General Education information will improve Description: Including pain rating scale, medication(s)/side effects and non-pharmacologic comfort measures Outcome: Progressing   Problem: Clinical Measurements: Goal: Ability to maintain clinical measurements within normal limits will improve Outcome: Progressing   Problem: Clinical Measurements: Goal: Respiratory complications will improve Outcome: Progressing   Problem: Clinical Measurements: Goal: Diagnostic test results will improve Outcome: Progressing   Problem: Elimination: Goal: Will not experience complications related to urinary retention Outcome: Progressing

## 2018-11-25 NOTE — Progress Notes (Signed)
Physical Therapy Treatment Patient Details Name: Lisa Parker MRN: [id number] DOB: 05-May-1946 Today's Date: 11/25/2018    History of Present Illness Pt is a 73 year old female s/p R TKA    PT Comments    Pt assisted to bathroom and requiring min assist initially (pt reports due to "stiff" knee).  Pt ambulated in hallway with rest breaks before and after steps.  Pt educated on safe stair technique.  Recommended pt have spouse close by for initial mobility for safety upon d/c.  Pt wishes to d/c back home today.  Provided HEP handout.  Pt had no further questions.  Follow Up Recommendations  Follow surgeon's recommendation for DC plan and follow-up therapies;Supervision for mobility/OOB     Equipment Recommendations  Rolling walker with 5" wheels    Recommendations for Other Services       Precautions / Restrictions Precautions Precautions: Fall;Knee Restrictions Other Position/Activity Restrictions: WBAT    Mobility  Bed Mobility Overal bed mobility: Needs Assistance Bed Mobility: Sit to Supine       Sit to supine: Supervision   General bed mobility comments: pt sitting EOB on arrival  Transfers Overall transfer level: Needs assistance Equipment used: Rolling walker (2 wheeled) Transfers: Sit to/from Stand Sit to Stand: Min assist         General transfer comment: verbal cues for UE and LE positioning, assist initially from recliner, improved from [facility] over toilet and rehab mat (for rest break with stairs) (pt reports she has recliner at home that adjusts)  Ambulation/Gait Ambulation/Gait assistance: Min assist;Min guard Gait Distance (Feet): 200 Feet Assistive device: Rolling walker (2 wheeled) Gait Pattern/deviations: Step-to pattern;Decreased stance time - right;Wide base of support     General Gait Details: verbal cues for safe technique and RW positioning, tends to keep RW forward due to toe out   Stairs Stairs: Yes Stairs assistance: Min guard Stair  Management: Step to pattern;Forwards;Two rails Number of Stairs: 3 General stair comments: verbal cues for sequence, pt able to reach both rails at home, aware to have spouse bring RW up steps first; pt reports understanding   Wheelchair Mobility    Modified Rankin (Stroke Patients Only)       Balance                                            Cognition Arousal/Alertness: Awake/alert Behavior During Therapy: WFL for tasks assessed/performed Overall Cognitive Status: Within Functional Limits for tasks assessed                                        Exercises Total Joint Exercises Ankle Circles/Pumps: AROM;10 reps;Both Quad Sets: AROM;Both;10 reps Heel Slides: AROM;Seated;Supine;Right;10 reps Hip ABduction/ADduction: AROM;Right;10 reps Straight Leg Raises: AAROM;Right;10 reps Long Arc Quad: AROM;Right;10 reps;Seated    General Comments        Pertinent Vitals/Pain Pain Assessment: 0-10 Pain Score: 5  Pain Location: right knee Pain Descriptors / Indicators: Aching;Sore Pain Intervention(s): Limited activity within patient's tolerance;Monitored during session;Repositioned    Home Living Family/patient expects to be discharged to:: Private residence Living Arrangements: Spouse/significant other Available Help at Discharge: Family   Home Access: Stairs to enter   Home Layout: One level Home Equipment: Environmental consultant - 2 wheels      Prior Function  Level of Independence: Independent          PT Goals (current goals can now be found in the care plan section) Acute Rehab PT Goals PT Goal Formulation: With patient Time For Goal Achievement: 11/29/18 Potential to Achieve Goals: Good Progress towards PT goals: Progressing toward goals    Frequency    7X/week      PT Plan Current plan remains appropriate    Co-evaluation              AM-PAC PT "6 Clicks" Mobility   Outcome Measure  Help needed turning from your back to  your side while in a flat bed without using bedrails?: A Little Help needed moving from lying on your back to sitting on the side of a flat bed without using bedrails?: A Little Help needed moving to and from a bed to a chair (including a wheelchair)?: A Little Help needed standing up from a chair using your arms (e.g., wheelchair or bedside chair)?: A Little Help needed to walk in hospital room?: A Little Help needed climbing 3-5 steps with a railing? : A Little 6 Click Score: 18    End of Session Equipment Utilized During Treatment: Gait belt Activity Tolerance: Patient tolerated treatment well Patient left: with call bell/phone within reach;in bed Nurse Communication: Mobility status PT Visit Diagnosis: Other abnormalities of gait and mobility (R26.89)     Time: 9163-8466 PT Time Calculation (min) (ACUTE ONLY): 22 min  Charges:  $Gait Training: 8-22 mins                     Carmelia Bake, PT, DPT Acute Rehabilitation Services Office: [phone] Pager: [phone]  Trena Platt 11/25/2018, 2:16 PM

## 2018-11-25 NOTE — Discharge Summary (Signed)
Patient ID: Lisa Parker MRN: [id number] DOB/AGE: Jan 05, 1946 73 y.o.  Admit date: 11/24/2018 Discharge date: 11/25/2018  Admission Diagnoses:  Principal Problem:   Osteoarthritis of right knee Active Problems:   Total knee replacement status, right   Discharge Diagnoses:  Same  Past Medical History:  Diagnosis Date  . Anxiety   . Arthritis   . Asthma   . HTN (hypertension)    "i had a gastric sleeve done and lost 100 pounds and i dont have htn anymore, my doctor, Dr Hilma Favors took me off of it "   . Hypothyroid   . Memory loss   . Seasonal allergies   . Sinus bradycardia   . Urinary tract infection     Surgeries: Procedure(s): Right Knee Arthroplasty on 11/24/2018   Consultants:   Discharged Condition: Improved  Hospital Course: Lisa Parker is an 73 y.o. female who was admitted 11/24/2018 for operative treatment ofOsteoarthritis of right knee. Patient has severe unremitting pain that affects sleep, daily activities, and work/hobbies. After pre-op clearance the patient was taken to the operating room on 11/24/2018 and underwent  Procedure(s): Right Knee Arthroplasty.    Patient was given perioperative antibiotics:  Anti-infectives (From admission, onward)   Start     Dose/Rate Route Frequency Ordered Stop   11/25/18 0600  ceFAZolin (ANCEF) IVPB 2g/100 mL premix     2 g 200 mL/hr over 30 Minutes Intravenous On call to O.R. 11/24/18 1317 11/24/18 1510       Patient was given sequential compression devices, early ambulation, and chemoprophylaxis to prevent DVT.  Patient benefited maximally from hospital stay and there were no complications.    Recent vital signs:  Patient Vitals for the past 24 hrs:  BP Temp Temp src Pulse Resp SpO2 Height Weight  11/25/18 0435 (!) 119/54 (!) 97.5 F (36.4 C) Axillary (!) 51 16 100 % - -  11/25/18 0118 118/62 - - (!) 58 16 100 % - -  11/24/18 2032 121/69 97.7 F (36.5 C) Oral 63 16 100 % - -  11/24/18 1938 (!) 148/60 97.6 F  (36.4 C) Oral (!) 58 16 100 % 5\' 4"  (1.626 m) 78.7 kg  11/24/18 1740 (!) 148/68 (!) 97.5 F (36.4 C) Axillary (!) 53 16 99 % - -  11/24/18 1715 (!) 128/51 97.8 F (36.6 C) - (!) 56 17 100 % - -  11/24/18 1700 (!) 124/51 97.8 F (36.6 C) - (!) 54 17 100 % - -  11/24/18 1645 (!) 123/53 - - (!) 58 17 100 % - -  11/24/18 1630 (!) 120/57 - - (!) 58 15 100 % - -  11/24/18 1628 123/60 97.8 F (36.6 C) - (!) 58 15 100 % - -     Recent laboratory studies:  Recent Labs    11/25/18 0253  WBC 7.8  HGB 12.4  HCT 40.7  PLT 151  NA 139  K 4.5  CL 105  CO2 24  BUN 14  CREATININE 0.74  GLUCOSE 174*  CALCIUM 8.5*     Discharge Medications:   Allergies as of 11/25/2018      Reactions   Mold Extract  [trichophyton]    Other reaction(s): Wheezing (ALLERGY/intolerance)   Pollen Extract    Other reaction(s): Wheezing (ALLERGY/intolerance)   Cucumber Extract Swelling   Other    Melon and Walnuts - swelling      Medication List    STOP taking these medications   acetaminophen 500 MG tablet Commonly  known as: TYLENOL     TAKE these medications   ALPRAZolam 0.5 MG tablet Commonly known as: XANAX Take 0.5 mg by mouth at bedtime.   aspirin EC 81 MG tablet Take 1 tablet (81 mg total) by mouth 2 (two) times daily.   budesonide-formoterol 80-4.5 MCG/ACT inhaler Commonly known as: SYMBICORT Inhale 2 puffs into the lungs 2 (two) times daily as needed (shortness of breath).   buPROPion 150 MG 24 hr tablet Commonly known as: WELLBUTRIN XL Take 150 mg by mouth daily.   citalopram 40 MG tablet Commonly known as: CELEXA Take 40 mg by mouth daily.   donepezil 10 MG tablet Commonly known as: ARICEPT Take 1 tablet (10 mg total) by mouth at bedtime.   escitalopram 20 MG tablet Commonly known as: LEXAPRO Take 20 mg by mouth daily.   levothyroxine 150 MCG tablet Commonly known as: SYNTHROID Take 150 mcg by mouth daily before breakfast. What changed: Another medication with the  same name was removed. Continue taking this medication, and follow the directions you see here.   memantine 10 MG tablet Commonly known as: NAMENDA TAKE  (1) TABLET TWICE A DAY. What changed:   how much to take  how to take this  when to take this  additional instructions   multivitamin tablet Take 1 tablet by mouth daily.   omega-3 acid ethyl esters 1 g capsule Commonly known as: LOVAZA Take 1 g by mouth daily.   oxyCODONE-acetaminophen 5-325 MG tablet Commonly known as: PERCOCET/ROXICET Take 1 tablet by mouth every 4 (four) hours as needed for severe pain.   tiZANidine 2 MG tablet Commonly known as: ZANAFLEX Take 1 tablet (2 mg total) by mouth every 6 (six) hours as needed.   vitamin B-12 100 MCG tablet Commonly known as: CYANOCOBALAMIN Take 100 mcg by mouth 2 (two) times a day.   Vitamin D 50 MCG (2000 UT) tablet Take 2,000 Units by mouth daily.            Durable Medical Equipment  (From admission, onward)         Start     Ordered   11/24/18 1739  DME Walker rolling  Once    Question:  Patient needs a walker to treat with the following condition  Answer:  Status post right knee replacement   11/24/18 1738   11/24/18 1739  DME 3 n 1  Once     11/24/18 1738           Discharge Care Instructions  (From admission, onward)         Start     Ordered   11/25/18 0000  Weight bearing as tolerated     11/25/18 1435          Diagnostic Studies: Dg Chest 2 View  Result Date: 11/21/2018 CLINICAL DATA:  Preop exam. EXAM: CHEST - 2 VIEW COMPARISON:  02/22/2012 FINDINGS: Lungs are adequately inflated and otherwise clear. Cardiomediastinal silhouette is normal in size with mild prominence of the main pulmonary artery segment unchanged. Remainder of the exam is unchanged. IMPRESSION: No acute cardiopulmonary disease. Mild prominence of the main pulmonary artery segment which may due to pulmonary arterial hypertension. Electronically Signed   By: Marin Olp M.D.   On: 11/21/2018 20:46    Disposition: Discharge disposition: 01-Home or Self Care       Discharge Instructions    Call MD / Call 911   Complete by: As directed    If you experience  chest pain or shortness of breath, CALL 911 and be transported to the hospital emergency room.  If you develope a fever above 101 F, pus (white drainage) or increased drainage or redness at the wound, or calf pain, call your surgeon's office.   Constipation Prevention   Complete by: As directed    Drink plenty of fluids.  Prune juice may be helpful.  You may use a stool softener, such as Colace (over the counter) 100 mg twice a day.  Use MiraLax (over the counter) for constipation as needed.   Diet - low sodium heart healthy   Complete by: As directed    Driving restrictions   Complete by: As directed    No driving for 2 weeks   Increase activity slowly as tolerated   Complete by: As directed    Patient may shower   Complete by: As directed    You may shower without a dressing once there is no drainage.  Do not wash over the wound.  If drainage remains, cover wound with plastic wrap and then shower.   Weight bearing as tolerated   Complete by: As directed       Follow-up Information    Frederik Pear, MD In 2 weeks.   Specialty: Orthopedic Surgery Contact information: [address] [place] [address] [phone]            Signed: Joanell Rising 11/25/2018, 2:36 PM

## 2018-11-25 NOTE — TOC Transition Note (Signed)
Transition of Care [facility]) - CM/SW Discharge Note   Patient Details  Name: Lisa Parker MRN: [id number] Date of Birth: 04-07-1946  [facility]) CM/SW Contact:  Lia Hopping, [place] Phone Number: 11/25/2018, 10:18 AM   Clinical Narrative:    [facility] for physical therapy, Has 3 IN 1 RW ordered through [place], Delivered to the patient room.    Final next level of care: [facility](Prearranged with [facility]) Barriers to Discharge: No Barriers Identified   Patient Goals and CMS Choice Patient states their goals for this hospitalization and ongoing recovery are:: Recover with Physical Therapy      Discharge Placement  Home                      Discharge Plan and Services                DME Arranged: Walker rolling DME Agency: [employer] Date DME Agency Contacted: 11/25/18 Time DME Agency Contacted: 0900 Representative spoke with at DME Agency: Ovid Curd HH Arranged: PT [place]: [facility]        Social Determinants of Health ([place]) Interventions     Readmission Risk Interventions No flowsheet data found.

## 2018-11-25 NOTE — Progress Notes (Signed)
PATIENT ID: [place]  MRN: [id number]  DOB/AGE:  08-Jan-1946 / 73 y.o.  1 Day Post-Op Procedure(s) (LRB): Right Knee Arthroplasty (Right)    PROGRESS NOTE Subjective: Patient is alert, oriented, no Nausea, no Vomiting, yes passing gas. Taking PO well. Denies SOB, Chest or Calf Pain. Using Incentive Spirometer, PAS in place. Ambulate WBAT, Patient reports pain as 1/10 .    Objective: Vital signs in last 24 hours: Vitals:   11/24/18 1938 11/24/18 2032 11/25/18 0118 11/25/18 0435  BP: (Abnormal) 148/60 121/69 118/62 (Abnormal) 119/54  Pulse: (Abnormal) 58 63 (Abnormal) 58 (Abnormal) 51  Resp: 16 16 16 16   Temp: 97.6 F (36.4 C) 97.7 F (36.5 C)  (Abnormal) 97.5 F (36.4 C)  TempSrc: Oral Oral  Axillary  SpO2: 100% 100% 100% 100%  Weight: 78.7 kg     Height: 5\' 4"  (1.626 m)         Intake/Output from previous day: I/O last 3 completed shifts: In: 2627.3 [P.O.:240; I.V.:2187.3; IV Piggyback:200] Out: 3295 [Urine:1700; Blood:150]   Intake/Output this shift: No intake/output data recorded.   LABORATORY DATA: Recent Labs    11/25/18 0253  WBC 7.8  HGB 12.4  HCT 40.7  PLT 151  NA 139  K 4.5  CL 105  CO2 24  BUN 14  CREATININE 0.74  GLUCOSE 174*  CALCIUM 8.5*    Examination: Neurologically intact ABD soft Neurovascular intact Sensation intact distally Intact pulses distally Dorsiflexion/Plantar flexion intact Incision: dressing C/D/I No cellulitis present Compartment soft}  Assessment:   1 Day Post-Op Procedure(s) (LRB): Right Knee Arthroplasty (Right) ADDITIONAL DIAGNOSIS: Expected Acute Blood Loss Anemia, Hypertension, mild dementia, hypothyroid  Patient's anticipated LOS is less than 2 midnights, meeting these requirements: - Younger than 28 - Lives within 1 hour of care - Has a competent adult at home to recover with post-op recover - NO history of  - Chronic pain requiring opiods  - Diabetes  - Coronary Artery Disease  - Heart failure  -  Heart attack  - Stroke  - DVT/VTE  - Cardiac arrhythmia  - Respiratory Failure/COPD  - Renal failure  - Anemia  - Advanced Liver disease       Plan: PT/OT WBAT, AROM and PROM  DVT Prophylaxis:  SCDx72hrs, ASA 81 mg BID x 2 weeks DISCHARGE PLAN: Home, today after PT DISCHARGE NEEDS: HHPT, Walker and 3-in-1 comode seat     Lisa [place] 11/25/2018, 7:20 AM Patient ID: [place], female   DOB: [birth year], 73 y.o.   MRN: [id number]

## 2018-11-25 NOTE — Care Management Obs Status (Signed)
[place] NOTIFICATION   Patient Details  Name: [place] MRN: [id number] Date of Birth: 1945/06/19   Medicare Observation Status Notification Given:  Yes    Lia Hopping, [place] 11/25/2018, 2:18 PM

## 2018-11-25 NOTE — Evaluation (Signed)
Physical Therapy Evaluation Patient Details Name: [place] MRN: [id number] DOB: [birth year] Today's Date: 11/25/2018   History of Present Illness  Pt is a 73 year old female s/p R TKA  Clinical Impression  Pt is s/p TKA resulting in the deficits listed below (see PT Problem List).  Pt will benefit from skilled PT to increase their independence and safety with mobility to allow discharge to the venue listed below.  Pt ambulated in hallway and performed LE exercises.  Pt to practice steps this afternoon and likely d/c home later today.      Follow Up Recommendations Follow surgeon's recommendation for DC plan and follow-up therapies;Supervision for mobility/OOB    Equipment Recommendations  Rolling walker with 5" wheels    Recommendations for Other Services       Precautions / Restrictions Precautions Precautions: Fall;Knee Restrictions Other Position/Activity Restrictions: WBAT      Mobility  Bed Mobility               General bed mobility comments: pt sitting EOB on arrival  Transfers Overall transfer level: Needs assistance Equipment used: Rolling walker (2 wheeled) Transfers: Sit to/from Stand Sit to Stand: Min guard         General transfer comment: verbal cues for UE and LE positioning, min/guard for safety  Ambulation/Gait Ambulation/Gait assistance: Min assist;Min guard Gait Distance (Feet): 120 Feet Assistive device: Rolling walker (2 wheeled) Gait Pattern/deviations: Step-to pattern;Decreased stance time - right;Wide base of support     General Gait Details: R toe out (baseline) from ankle per pt; cues for sequence, RW positioning, safety  Stairs            Wheelchair Mobility    Modified Rankin (Stroke Patients Only)       Balance                                             Pertinent Vitals/Pain Pain Assessment: 0-10 Pain Score: 5  Pain Location: right knee Pain Descriptors / Indicators:  Aching;Sore Pain Intervention(s): Monitored during session;Repositioned;Ice applied;RN gave pain meds during session    [place] expects to be discharged to:: Private residence Living Arrangements: Spouse/significant other Available Help at Discharge: Family   Home Access: Stairs to enter   Technical brewer of Steps: [place]: One level Teller: Environmental consultant - 2 wheels      Prior Function Level of Independence: Independent               Hand Dominance        Extremity/Trunk Assessment        Lower Extremity Assessment Lower Extremity Assessment: RLE deficits/detail;LLE deficits/detail RLE Deficits / Details: good quad contraction, assist for SLR, AAROM R knee flexion approx 45* sitting, pes planus and increased ankle eversion position at rest LLE Deficits / Details: L knee valgus deformity       Communication   Communication: No difficulties  Cognition                                              General Comments      Exercises Total Joint Exercises Ankle Circles/Pumps: AROM;10 reps;Both Quad Sets: AROM;Both;10 reps Heel Slides: AROM;Seated;Supine;Right;10 reps Hip ABduction/ADduction: AROM;Right;10 reps Straight Leg Raises:  AAROM;Right;10 reps Long Arc Quad: AROM;Right;10 reps;Seated   Assessment/Plan    PT Assessment Patient needs continued PT services  PT Problem List Decreased strength;Decreased mobility;Decreased range of motion;Decreased knowledge of use of DME;Pain;Decreased knowledge of precautions       PT Treatment Interventions Gait training;Balance training;DME instruction;Therapeutic exercise;Functional mobility training;Therapeutic activities;Patient/family education;Stair training    PT Goals (Current goals can be found in the Care Plan section)  Acute Rehab PT Goals PT Goal Formulation: With patient Time For Goal Achievement: 11/29/18 Potential to Achieve Goals: Good    Frequency  7X/week   Barriers to discharge        Co-evaluation               AM-PAC PT "6 Clicks" Mobility  Outcome Measure Help needed turning from your back to your side while in a flat bed without using bedrails?: A Little Help needed moving from lying on your back to sitting on the side of a flat bed without using bedrails?: A Little Help needed moving to and from a bed to a chair (including a wheelchair)?: A Little Help needed standing up from a chair using your arms (e.g., wheelchair or bedside chair)?: A Little Help needed to walk in hospital room?: A Little Help needed climbing 3-5 steps with a railing? : A Little 6 Click Score: 18    End of Session Equipment Utilized During Treatment: Gait belt Activity Tolerance: Patient tolerated treatment well Patient left: in chair;with call bell/phone within reach Nurse Communication: Mobility status PT Visit Diagnosis: Other abnormalities of gait and mobility (R26.89)    Time: 5053-9767 PT Time Calculation (min) (ACUTE ONLY): 18 min   Charges:   PT Evaluation $PT Eval Low Complexity: [place], PT, DPT Acute Rehabilitation Services Office: [phone] Pager: [phone]  Trena Platt 11/25/2018, 11:58 AM

## 2018-11-26 ENCOUNTER — Encounter (HOSPITAL_COMMUNITY): Payer: Self-pay | Admitting: Orthopedic Surgery

## 2018-11-26 DIAGNOSIS — I10 Essential (primary) hypertension: Secondary | ICD-10-CM | POA: Diagnosis not present

## 2018-11-26 DIAGNOSIS — Z7982 Long term (current) use of aspirin: Secondary | ICD-10-CM | POA: Diagnosis not present

## 2018-11-26 DIAGNOSIS — Z471 Aftercare following joint replacement surgery: Secondary | ICD-10-CM | POA: Diagnosis not present

## 2018-11-26 DIAGNOSIS — Z87891 Personal history of nicotine dependence: Secondary | ICD-10-CM | POA: Diagnosis not present

## 2018-11-26 DIAGNOSIS — J45909 Unspecified asthma, uncomplicated: Secondary | ICD-10-CM | POA: Diagnosis not present

## 2018-11-26 DIAGNOSIS — Z96651 Presence of right artificial knee joint: Secondary | ICD-10-CM | POA: Diagnosis not present

## 2018-11-26 DIAGNOSIS — E039 Hypothyroidism, unspecified: Secondary | ICD-10-CM | POA: Diagnosis not present

## 2018-11-26 DIAGNOSIS — D649 Anemia, unspecified: Secondary | ICD-10-CM | POA: Diagnosis not present

## 2018-11-26 DIAGNOSIS — Z87440 Personal history of urinary (tract) infections: Secondary | ICD-10-CM | POA: Diagnosis not present

## 2018-11-27 NOTE — Anesthesia Postprocedure Evaluation (Signed)
Anesthesia Post Note  Patient: Lisa [place]  Procedure(s) Performed: Right Knee Arthroplasty (Right Knee)     Patient location during evaluation: PACU Anesthesia Type: Spinal Level of consciousness: oriented and awake and alert Pain management: pain level controlled Vital Signs Assessment: post-procedure vital signs reviewed and stable Respiratory status: spontaneous breathing, respiratory function stable and patient connected to nasal cannula oxygen Cardiovascular status: blood pressure returned to baseline and stable Postop Assessment: no headache, no backache and no apparent nausea or vomiting Anesthetic complications: no    Last Vitals:  Vitals:   11/25/18 0118 11/25/18 0435  BP: 118/62 (!) 119/54  Pulse: (!) 58 (!) 51  Resp: 16 16  Temp:  (!) 36.4 C  SpO2: 100% 100%    Last Pain:  Vitals:   11/25/18 1459  TempSrc:   PainSc: 0-No pain                 Patt Steinhardt S

## 2018-11-28 DIAGNOSIS — Z7982 Long term (current) use of aspirin: Secondary | ICD-10-CM | POA: Diagnosis not present

## 2018-11-28 DIAGNOSIS — Z87440 Personal history of urinary (tract) infections: Secondary | ICD-10-CM | POA: Diagnosis not present

## 2018-11-28 DIAGNOSIS — Z87891 Personal history of nicotine dependence: Secondary | ICD-10-CM | POA: Diagnosis not present

## 2018-11-28 DIAGNOSIS — J45909 Unspecified asthma, uncomplicated: Secondary | ICD-10-CM | POA: Diagnosis not present

## 2018-11-28 DIAGNOSIS — I10 Essential (primary) hypertension: Secondary | ICD-10-CM | POA: Diagnosis not present

## 2018-11-28 DIAGNOSIS — D649 Anemia, unspecified: Secondary | ICD-10-CM | POA: Diagnosis not present

## 2018-11-28 DIAGNOSIS — Z471 Aftercare following joint replacement surgery: Secondary | ICD-10-CM | POA: Diagnosis not present

## 2018-11-28 DIAGNOSIS — E039 Hypothyroidism, unspecified: Secondary | ICD-10-CM | POA: Diagnosis not present

## 2018-11-28 DIAGNOSIS — Z96651 Presence of right artificial knee joint: Secondary | ICD-10-CM | POA: Diagnosis not present

## 2018-12-01 DIAGNOSIS — I10 Essential (primary) hypertension: Secondary | ICD-10-CM | POA: Diagnosis not present

## 2018-12-01 DIAGNOSIS — J45909 Unspecified asthma, uncomplicated: Secondary | ICD-10-CM | POA: Diagnosis not present

## 2018-12-01 DIAGNOSIS — D649 Anemia, unspecified: Secondary | ICD-10-CM | POA: Diagnosis not present

## 2018-12-01 DIAGNOSIS — Z96651 Presence of right artificial knee joint: Secondary | ICD-10-CM | POA: Diagnosis not present

## 2018-12-01 DIAGNOSIS — E039 Hypothyroidism, unspecified: Secondary | ICD-10-CM | POA: Diagnosis not present

## 2018-12-01 DIAGNOSIS — Z87891 Personal history of nicotine dependence: Secondary | ICD-10-CM | POA: Diagnosis not present

## 2018-12-01 DIAGNOSIS — Z87440 Personal history of urinary (tract) infections: Secondary | ICD-10-CM | POA: Diagnosis not present

## 2018-12-01 DIAGNOSIS — Z7982 Long term (current) use of aspirin: Secondary | ICD-10-CM | POA: Diagnosis not present

## 2018-12-01 DIAGNOSIS — Z471 Aftercare following joint replacement surgery: Secondary | ICD-10-CM | POA: Diagnosis not present

## 2018-12-03 DIAGNOSIS — Z87440 Personal history of urinary (tract) infections: Secondary | ICD-10-CM | POA: Diagnosis not present

## 2018-12-03 DIAGNOSIS — Z471 Aftercare following joint replacement surgery: Secondary | ICD-10-CM | POA: Diagnosis not present

## 2018-12-03 DIAGNOSIS — D649 Anemia, unspecified: Secondary | ICD-10-CM | POA: Diagnosis not present

## 2018-12-03 DIAGNOSIS — Z87891 Personal history of nicotine dependence: Secondary | ICD-10-CM | POA: Diagnosis not present

## 2018-12-03 DIAGNOSIS — I10 Essential (primary) hypertension: Secondary | ICD-10-CM | POA: Diagnosis not present

## 2018-12-03 DIAGNOSIS — Z96651 Presence of right artificial knee joint: Secondary | ICD-10-CM | POA: Diagnosis not present

## 2018-12-03 DIAGNOSIS — Z7982 Long term (current) use of aspirin: Secondary | ICD-10-CM | POA: Diagnosis not present

## 2018-12-03 DIAGNOSIS — J45909 Unspecified asthma, uncomplicated: Secondary | ICD-10-CM | POA: Diagnosis not present

## 2018-12-03 DIAGNOSIS — E039 Hypothyroidism, unspecified: Secondary | ICD-10-CM | POA: Diagnosis not present

## 2018-12-05 DIAGNOSIS — D649 Anemia, unspecified: Secondary | ICD-10-CM | POA: Diagnosis not present

## 2018-12-05 DIAGNOSIS — E039 Hypothyroidism, unspecified: Secondary | ICD-10-CM | POA: Diagnosis not present

## 2018-12-05 DIAGNOSIS — Z471 Aftercare following joint replacement surgery: Secondary | ICD-10-CM | POA: Diagnosis not present

## 2018-12-05 DIAGNOSIS — I10 Essential (primary) hypertension: Secondary | ICD-10-CM | POA: Diagnosis not present

## 2018-12-05 DIAGNOSIS — Z96651 Presence of right artificial knee joint: Secondary | ICD-10-CM | POA: Diagnosis not present

## 2018-12-05 DIAGNOSIS — Z87891 Personal history of nicotine dependence: Secondary | ICD-10-CM | POA: Diagnosis not present

## 2018-12-05 DIAGNOSIS — J45909 Unspecified asthma, uncomplicated: Secondary | ICD-10-CM | POA: Diagnosis not present

## 2018-12-05 DIAGNOSIS — Z7982 Long term (current) use of aspirin: Secondary | ICD-10-CM | POA: Diagnosis not present

## 2018-12-05 DIAGNOSIS — Z87440 Personal history of urinary (tract) infections: Secondary | ICD-10-CM | POA: Diagnosis not present

## 2018-12-08 DIAGNOSIS — Z7982 Long term (current) use of aspirin: Secondary | ICD-10-CM | POA: Diagnosis not present

## 2018-12-08 DIAGNOSIS — Z471 Aftercare following joint replacement surgery: Secondary | ICD-10-CM | POA: Diagnosis not present

## 2018-12-08 DIAGNOSIS — D649 Anemia, unspecified: Secondary | ICD-10-CM | POA: Diagnosis not present

## 2018-12-08 DIAGNOSIS — I10 Essential (primary) hypertension: Secondary | ICD-10-CM | POA: Diagnosis not present

## 2018-12-08 DIAGNOSIS — J45909 Unspecified asthma, uncomplicated: Secondary | ICD-10-CM | POA: Diagnosis not present

## 2018-12-08 DIAGNOSIS — Z87440 Personal history of urinary (tract) infections: Secondary | ICD-10-CM | POA: Diagnosis not present

## 2018-12-08 DIAGNOSIS — Z87891 Personal history of nicotine dependence: Secondary | ICD-10-CM | POA: Diagnosis not present

## 2018-12-08 DIAGNOSIS — E039 Hypothyroidism, unspecified: Secondary | ICD-10-CM | POA: Diagnosis not present

## 2018-12-08 DIAGNOSIS — Z96651 Presence of right artificial knee joint: Secondary | ICD-10-CM | POA: Diagnosis not present

## 2018-12-09 DIAGNOSIS — M25561 Pain in right knee: Secondary | ICD-10-CM | POA: Diagnosis not present

## 2018-12-10 DIAGNOSIS — Z7982 Long term (current) use of aspirin: Secondary | ICD-10-CM | POA: Diagnosis not present

## 2018-12-10 DIAGNOSIS — Z471 Aftercare following joint replacement surgery: Secondary | ICD-10-CM | POA: Diagnosis not present

## 2018-12-10 DIAGNOSIS — Z87891 Personal history of nicotine dependence: Secondary | ICD-10-CM | POA: Diagnosis not present

## 2018-12-10 DIAGNOSIS — J45909 Unspecified asthma, uncomplicated: Secondary | ICD-10-CM | POA: Diagnosis not present

## 2018-12-10 DIAGNOSIS — Z96651 Presence of right artificial knee joint: Secondary | ICD-10-CM | POA: Diagnosis not present

## 2018-12-10 DIAGNOSIS — I10 Essential (primary) hypertension: Secondary | ICD-10-CM | POA: Diagnosis not present

## 2018-12-10 DIAGNOSIS — Z87440 Personal history of urinary (tract) infections: Secondary | ICD-10-CM | POA: Diagnosis not present

## 2018-12-10 DIAGNOSIS — E039 Hypothyroidism, unspecified: Secondary | ICD-10-CM | POA: Diagnosis not present

## 2018-12-10 DIAGNOSIS — D649 Anemia, unspecified: Secondary | ICD-10-CM | POA: Diagnosis not present

## 2018-12-12 DIAGNOSIS — I10 Essential (primary) hypertension: Secondary | ICD-10-CM | POA: Diagnosis not present

## 2018-12-12 DIAGNOSIS — Z471 Aftercare following joint replacement surgery: Secondary | ICD-10-CM | POA: Diagnosis not present

## 2018-12-12 DIAGNOSIS — Z96651 Presence of right artificial knee joint: Secondary | ICD-10-CM | POA: Diagnosis not present

## 2018-12-12 DIAGNOSIS — J45909 Unspecified asthma, uncomplicated: Secondary | ICD-10-CM | POA: Diagnosis not present

## 2018-12-12 DIAGNOSIS — Z87891 Personal history of nicotine dependence: Secondary | ICD-10-CM | POA: Diagnosis not present

## 2018-12-12 DIAGNOSIS — Z87440 Personal history of urinary (tract) infections: Secondary | ICD-10-CM | POA: Diagnosis not present

## 2018-12-12 DIAGNOSIS — E039 Hypothyroidism, unspecified: Secondary | ICD-10-CM | POA: Diagnosis not present

## 2018-12-12 DIAGNOSIS — Z7982 Long term (current) use of aspirin: Secondary | ICD-10-CM | POA: Diagnosis not present

## 2018-12-12 DIAGNOSIS — D649 Anemia, unspecified: Secondary | ICD-10-CM | POA: Diagnosis not present

## 2018-12-15 DIAGNOSIS — I10 Essential (primary) hypertension: Secondary | ICD-10-CM | POA: Diagnosis not present

## 2018-12-15 DIAGNOSIS — E039 Hypothyroidism, unspecified: Secondary | ICD-10-CM | POA: Diagnosis not present

## 2018-12-15 DIAGNOSIS — Z471 Aftercare following joint replacement surgery: Secondary | ICD-10-CM | POA: Diagnosis not present

## 2018-12-15 DIAGNOSIS — Z96651 Presence of right artificial knee joint: Secondary | ICD-10-CM | POA: Diagnosis not present

## 2018-12-15 DIAGNOSIS — D649 Anemia, unspecified: Secondary | ICD-10-CM | POA: Diagnosis not present

## 2018-12-15 DIAGNOSIS — J45909 Unspecified asthma, uncomplicated: Secondary | ICD-10-CM | POA: Diagnosis not present

## 2018-12-15 DIAGNOSIS — Z7982 Long term (current) use of aspirin: Secondary | ICD-10-CM | POA: Diagnosis not present

## 2018-12-15 DIAGNOSIS — Z87440 Personal history of urinary (tract) infections: Secondary | ICD-10-CM | POA: Diagnosis not present

## 2018-12-15 DIAGNOSIS — Z87891 Personal history of nicotine dependence: Secondary | ICD-10-CM | POA: Diagnosis not present

## 2018-12-18 DIAGNOSIS — E039 Hypothyroidism, unspecified: Secondary | ICD-10-CM | POA: Diagnosis not present

## 2018-12-18 DIAGNOSIS — I10 Essential (primary) hypertension: Secondary | ICD-10-CM | POA: Diagnosis not present

## 2018-12-18 DIAGNOSIS — J45909 Unspecified asthma, uncomplicated: Secondary | ICD-10-CM | POA: Diagnosis not present

## 2018-12-18 DIAGNOSIS — D649 Anemia, unspecified: Secondary | ICD-10-CM | POA: Diagnosis not present

## 2018-12-18 DIAGNOSIS — Z87891 Personal history of nicotine dependence: Secondary | ICD-10-CM | POA: Diagnosis not present

## 2018-12-18 DIAGNOSIS — Z471 Aftercare following joint replacement surgery: Secondary | ICD-10-CM | POA: Diagnosis not present

## 2018-12-18 DIAGNOSIS — Z87440 Personal history of urinary (tract) infections: Secondary | ICD-10-CM | POA: Diagnosis not present

## 2018-12-18 DIAGNOSIS — Z96651 Presence of right artificial knee joint: Secondary | ICD-10-CM | POA: Diagnosis not present

## 2018-12-18 DIAGNOSIS — Z7982 Long term (current) use of aspirin: Secondary | ICD-10-CM | POA: Diagnosis not present

## 2018-12-23 ENCOUNTER — Ambulatory Visit: Payer: Medicare Other | Attending: Orthopedic Surgery | Admitting: Physical Therapy

## 2018-12-23 ENCOUNTER — Encounter: Payer: Self-pay | Admitting: Physical Therapy

## 2018-12-23 ENCOUNTER — Other Ambulatory Visit: Payer: Self-pay

## 2018-12-23 DIAGNOSIS — M25561 Pain in right knee: Secondary | ICD-10-CM | POA: Diagnosis not present

## 2018-12-23 DIAGNOSIS — M6281 Muscle weakness (generalized): Secondary | ICD-10-CM | POA: Diagnosis not present

## 2018-12-23 NOTE — Therapy (Signed)
[facility] [place], [place], [address] Phone: [phone]   Fax:  [phone]  Physical Therapy Evaluation  Patient Details  Name: [place] MRN: [id number] Date of Birth: [birth year] Referring Provider (PT): Kathalene Frames. Mayer Camel, MD   Encounter Date: 12/23/2018  PT End of Session - 12/23/18 1045    Visit Number  1    Number of Visits  12    Date for PT Re-Evaluation  02/17/19    PT Start Time  0945    PT Stop Time  1025    PT Time Calculation (min)  40 min    Activity Tolerance  Patient tolerated treatment well    Behavior During Therapy  [facility] for tasks assessed/performed       Past Medical History:  Diagnosis Date  . Anxiety   . Arthritis   . Asthma   . HTN (hypertension)    "i had a gastric sleeve done and lost 100 pounds and i dont have htn anymore, my doctor, Dr Hilma Favors took me off of it "   . Hypothyroid   . Memory loss   . Seasonal allergies   . Sinus bradycardia   . Urinary tract infection     Past Surgical History:  Procedure Laterality Date  . BLEPHAROPLASTY Bilateral   . CATARACT EXTRACTION, BILATERAL     with lens   . CESAREAN SECTION    . FINGER SURGERY     left pinky  . HERNIA REPAIR  2004   inguinal , incisional, navel   . KNEE SURGERY Left    arthroscopic   . LAPAROSCOPIC GASTRIC SLEEVE RESECTION    . MASS EXCISION Left 10/20/2015   Procedure: EXCISION MASS LEFT SMALL FINGER;  Surgeon: Leanora Cover, MD;  Location: [place];  Service: Orthopedics;  Laterality: Left;  . TONSILLECTOMY AND ADENOIDECTOMY    . TOTAL KNEE ARTHROPLASTY Right 11/24/2018   Procedure: Right Knee Arthroplasty;  Surgeon: Frederik Pear, MD;  Location: [facility];  Service: Orthopedics;  Laterality: Right;    There were no vitals filed for this visit.   Subjective Assessment - 12/23/18 1036    Subjective  COVID-19 screening performed upon arrival. Patient arrives to physical therapy with reports of right  knee pain and muscle weakness secondary to right TKA on November 24, 2018. Patient received 8 visits of home health physical therapy to which she is compliant to HEP provided by home health physical therapist. Patient is independent with ADLs but increased time to perform home activities. Patient reports pain at worst as 4/10 and pain at best as 2/10. Patient's goals are to decrease pain, improve movement, and improve ability to perform home activities.    Pertinent History  right TKA 11/24/2018    Limitations  Standing;Walking    Diagnostic tests  x-ray    Patient Stated Goals  decrease pain, improve movement    Currently in Pain?  Yes    Pain Score  2     Pain Location  Knee    Pain Orientation  Right    Pain Descriptors / Indicators  Sore;Aching    Pain Type  Surgical pain    Pain Onset  1 to 4 weeks ago    Pain Frequency  Constant    Aggravating Factors   over working    Pain Relieving Factors  x3 tylenol    Effect of Pain on Daily Activities  n/a         [facility]  PT Assessment - 12/23/18 0001      Assessment   Medical Diagnosis  right TKA    Referring Provider (PT)  Kathalene Frames. Mayer Camel, MD    Onset Date/Surgical Date  11/24/18    Next MD Visit  January 06, 2019    Prior Therapy  Home health therapy      Precautions   Precautions  None      Restrictions   Weight Bearing Restrictions  No      Balance Screen   Has the patient fallen in the past 6 months  No    Has the patient had a decrease in activity level because of a fear of falling?   No    Is the patient reluctant to leave their home because of a fear of falling?   No      Home Film/video editor residence    Living Arrangements  Spouse/significant other    Home Access  Stairs to enter    Entrance Stairs-Number of Steps  5    Entrance Stairs-Rails  Can reach both      Prior Function   Level of Independence  Independent      Observation/Other Assessments   Focus on Therapeutic Outcomes (FOTO)   36%  limitation      Observation/Other Assessments-Edema    Edema  Circumferential      Circumferential Edema   Circumferential - Right  43 cm at mid patella    Circumferential - Left   39 cm at mid patella      ROM / Strength   AROM / PROM / Strength  AROM;Strength;PROM      AROM   AROM Assessment Site  Knee    Right/Left Knee  Right    Right Knee Extension  3    Right Knee Flexion  114      PROM   PROM Assessment Site  Knee    Right/Left Knee  Right    Right Knee Extension  2    Right Knee Flexion  116      Strength   Strength Assessment Site  Hip;Knee    Right/Left Hip  Right    Right Hip Flexion  4-/5    Right Hip Extension  3/5    Right Hip ABduction  3/5    Right/Left Knee  Right    Right Knee Flexion  4+/5    Right Knee Extension  4+/5      Palpation   Patella mobility  WFL    Palpation comment  tenderness to medial and lateral joint lines      Transfers   Comments  independent but with increased time      Ambulation/Gait   Assistive device  Straight cane    Gait Pattern  Step-through pattern;Decreased stride length;Wide base of support    Gait Comments  significant postural abnormalities that cause gait deviations                Objective measurements completed on examination: See above findings.              PT Education - 12/23/18 1325    Education Details  hip abduction red theraband, glute sets    Person(s) Educated  Patient    Methods  Explanation;Demonstration;Handout    Comprehension  Verbalized understanding;Returned demonstration          PT Long Term Goals - 12/23/18 1247      PT LONG TERM  GOAL #1   Title  Patient will be independent with HEP    Time  6    Period  Weeks    Status  New      PT LONG TERM GOAL #2   Title  Patient will demonstrate 120 degrees of right knee flexion AROMto improve stability during functional tasks.    Time  6    Period  Weeks    Status  New      PT LONG TERM GOAL #3   Title   Patient will demonstrate 4+/5 or greater right knee MMT in all planes to improve stability during functional tasks.    Time  6    Period  Weeks    Status  New      PT LONG TERM GOAL #4   Title  Patient will demonstrate reciprocating stair ambulation with one railing to safely enter and exit home.    Time  6    Period  Weeks    Status  New             Plan - 12/23/18 1219    Clinical Impression Statement  Patient is a 73 year old female who presents to physical therapy with difficulty walking, decreased right hip MMT, and decreased right knee AROM secondary to a right knee TKA on 11/24/2018. Patient noted with increased right knee edema in comparison to unaffected left. Patient ambulates with a hurry cane with decreased right stance time, decreased left step length. Patient also noted with left knee genu valgum and significant bilateral foot pronation. Patient and PT discussed continuing HEP provided by home health therapist with addition of HEP provided today. Patient reported understanding. Patient would benefit from skilled physical therapy to address deficits and address patient's goals.    Stability/Clinical Decision Making  Stable/Uncomplicated    Clinical Decision Making  Low    Rehab Potential  Good    PT Frequency  2x / week    PT Duration  6 weeks    PT Treatment/Interventions  ADLs/Self Care Home Management;Electrical Stimulation;Moist Heat;Cryotherapy;Gait training;Stair training;Functional mobility training;Therapeutic activities;Therapeutic exercise;Balance training;Passive range of motion;Scar mobilization;Patient/family education;Neuromuscular re-education;Manual techniques;Vasopneumatic Device;Taping    PT Next Visit Plan  Nustep, hip and knee strengthening, end range knee extension strengthening, modalities PRN for pain relief    PT Home Exercise Plan  see patient education section    Consulted and Agree with Plan of Care  Patient       Patient will benefit from  skilled therapeutic intervention in order to improve the following deficits and impairments:  Difficulty walking, Abnormal gait, Pain, Decreased activity tolerance, Decreased balance, Decreased range of motion, Decreased strength, Increased edema  Visit Diagnosis: 1. Acute pain of right knee   2. Muscle weakness (generalized)        Problem List Patient Active Problem List   Diagnosis Date Noted  . Total knee replacement status, right 11/24/2018  . Osteoarthritis of right knee 11/20/2018  . Mild cognitive impairment 10/09/2016  . High blood pressure 09/15/2013  . Hypothyroidism 09/15/2013  . Obesity (BMI 30-39.9) 09/15/2013  . S/P arthroscopy of left knee 10/02/2011  . Lateral meniscal tear 01/18/2011   Gabriela Eves, PT, DPT 12/23/2018, 1:33 PM  [facility] [address] [place], [place], [address] Phone: [phone]   Fax:  [phone]  Name: Lisa [place] MRN: [id number] Date of Birth: 26-Jun-1945

## 2018-12-25 ENCOUNTER — Encounter: Payer: Self-pay | Admitting: Physical Therapy

## 2018-12-25 ENCOUNTER — Other Ambulatory Visit: Payer: Self-pay

## 2018-12-25 ENCOUNTER — Ambulatory Visit: Payer: Medicare Other | Admitting: Physical Therapy

## 2018-12-25 DIAGNOSIS — M25561 Pain in right knee: Secondary | ICD-10-CM

## 2018-12-25 DIAGNOSIS — M6281 Muscle weakness (generalized): Secondary | ICD-10-CM

## 2018-12-25 NOTE — Therapy (Addendum)
[facility] [place], [place], [address] Phone: [phone]   Fax:  [phone]  Physical Therapy Treatment  Patient Details  Name: Lisa [place] MRN: [id number] Date of Birth: 01/16/1946 Referring Provider (PT): Kathalene Frames. Mayer Camel, MD   Encounter Date: 12/25/2018  PT End of Session - 12/25/18 1049    Visit Number  2    Number of Visits  12    Date for PT Re-Evaluation  02/17/19    PT Start Time  0945    PT Stop Time  1036    PT Time Calculation (min)  51 min    Activity Tolerance  Patient tolerated treatment well    Behavior During Therapy  [facility] for tasks assessed/performed       Past Medical History:  Diagnosis Date  . Anxiety   . Arthritis   . Asthma   . HTN (hypertension)    "i had a gastric sleeve done and lost 100 pounds and i dont have htn anymore, my doctor, Dr Hilma Favors took me off of it "   . Hypothyroid   . Memory loss   . Seasonal allergies   . Sinus bradycardia   . Urinary tract infection     Past Surgical History:  Procedure Laterality Date  . BLEPHAROPLASTY Bilateral   . CATARACT EXTRACTION, BILATERAL     with lens   . CESAREAN SECTION    . FINGER SURGERY     left pinky  . HERNIA REPAIR  2004   inguinal , incisional, navel   . KNEE SURGERY Left    arthroscopic   . LAPAROSCOPIC GASTRIC SLEEVE RESECTION    . MASS EXCISION Left 10/20/2015   Procedure: EXCISION MASS LEFT SMALL FINGER;  Surgeon: Leanora Cover, MD;  Location: [place];  Service: Orthopedics;  Laterality: Left;  . TONSILLECTOMY AND ADENOIDECTOMY    . TOTAL KNEE ARTHROPLASTY Right 11/24/2018   Procedure: Right Knee Arthroplasty;  Surgeon: Frederik Pear, MD;  [facility];  Service: Orthopedics;  Laterality: Right;    There were no vitals filed for this visit.  Subjective Assessment - 12/25/18 0955    Subjective  COVID-19 screening performed upon arrival. Reports feeling more sore today with increased swelling in the  knee and down into the ankle.    Pertinent History  right TKA 11/24/2018    Limitations  Standing;Walking    Diagnostic tests  x-ray    Patient Stated Goals  decrease pain, improve movement    Currently in Pain?  Yes    Pain Score  4     Pain Location  Knee    Pain Orientation  Right    Pain Descriptors / Indicators  Sore    Pain Type  Surgical pain    Pain Onset  1 to 4 weeks ago    Pain Frequency  Constant         [facility] PT Assessment - 12/25/18 0001      Assessment   Medical Diagnosis  right TKA    Referring Provider (PT)  Kathalene Frames. Mayer Camel, MD    Onset Date/Surgical Date  11/24/18    Next MD Visit  January 06, 2019    Prior Therapy  Home health therapy      Precautions   Precautions  None      Restrictions   Weight Bearing Restrictions  No  [facility] Adult PT Treatment/Exercise - 12/25/18 0001      Exercises   Exercises  Knee/Hip      Knee/Hip Exercises: Aerobic   Nustep  Level 3 x10 minutes      Knee/Hip Exercises: Seated   Long Arc Quad  AROM;Right;2 sets;10 reps    Hamstring Curl  Strengthening;Right;2 sets;10 reps    Hamstring Limitations  red theraband      Knee/Hip Exercises: Supine   Short Arc Quad Sets  Right;2 sets;10 reps    Straight Leg Raises  AROM;Right;2 sets;10 reps    Other Supine Knee/Hip Exercises  clam shells 2x10 red theraband    Other Supine Knee/Hip Exercises         Modalities   Modalities  Electrical Stimulation;Vasopneumatic      Electrical Stimulation   Electrical Stimulation Location  right knee    Electrical Stimulation Action  IFC    Electrical Stimulation Parameters  1-10 hz x10 mins    Electrical Stimulation Goals  Pain;Edema      Vasopneumatic   Number Minutes Vasopneumatic   10 minutes    Vasopnuematic Location   Knee    Vasopneumatic Pressure  Low    Vasopneumatic Temperature   40      Manual Therapy   Manual Therapy  Passive ROM;Joint mobilization    Joint Mobilization  patella mobs to  improve motions    Passive ROM  gentle PROM to flexion and extension to improve ROM                  PT Long Term Goals - 12/23/18 1247      PT LONG TERM GOAL #1   Title  Patient will be independent with HEP    Time  6    Period  Weeks    Status  New      PT LONG TERM GOAL #2   Title  Patient will demonstrate 120 degrees of right knee flexion AROMto improve stability during functional tasks.    Time  6    Period  Weeks    Status  New      PT LONG TERM GOAL #3   Title  Patient will demonstrate 4+/5 or greater right knee MMT in all planes to improve stability during functional tasks.    Time  6    Period  Weeks    Status  New      PT LONG TERM GOAL #4   Title  Patient will demonstrate reciprocating stair ambulation with one railing to safely enter and exit home.    Time  6    Period  Weeks    Status  New            Plan - 12/25/18 1028    Clinical Impression Statement  Patient arrives with increased pain but was able to complete exrercises without any increase of pain. Patient was able to perform all exercises with good form after demonstration. Patient responded well to modalities with no adverse affects. Patient instructed to continue HEP, ice and elevation. Patient reported understanding.    Stability/Clinical Decision Making  Stable/Uncomplicated    Clinical Decision Making  Low    Rehab Potential  Good    PT Frequency  2x / week    PT Duration  6 weeks    PT Treatment/Interventions  ADLs/Self Care Home Management;Electrical Stimulation;Moist Heat;Cryotherapy;Gait training;Stair training;Functional mobility training;Therapeutic activities;Therapeutic exercise;Balance training;Passive range of motion;Scar mobilization;Patient/family education;Neuromuscular re-education;Manual techniques;Vasopneumatic Device;Taping    PT  Next Visit Plan  Nustep, hip and knee strengthening, end range knee extension strengthening, modalities PRN for pain relief    PT Home  Exercise Plan  see patient education section    Consulted and Agree with Plan of Care  Patient       Patient will benefit from skilled therapeutic intervention in order to improve the following deficits and impairments:  Difficulty walking, Abnormal gait, Pain, Decreased activity tolerance, Decreased balance, Decreased range of motion, Decreased strength, Increased edema  Visit Diagnosis: Acute pain of right knee  Muscle weakness (generalized)     Problem List Patient Active Problem List   Diagnosis Date Noted  . Total knee replacement status, right 11/24/2018  . Osteoarthritis of right knee 11/20/2018  . Mild cognitive impairment 10/09/2016  . High blood pressure 09/15/2013  . Hypothyroidism 09/15/2013  . Obesity (BMI 30-39.9) 09/15/2013  . S/P arthroscopy of left knee 10/02/2011  . Lateral meniscal tear 01/18/2011    Gabriela Eves, PT, DPT 12/25/2018, 12:52 PM  [facility] [address] [place], [place], [address] Phone: [phone]   Fax:  [phone]  Name: [place] MRN: [id number] Date of Birth: [birth year]

## 2018-12-30 ENCOUNTER — Other Ambulatory Visit: Payer: Self-pay

## 2018-12-30 ENCOUNTER — Ambulatory Visit: Payer: Medicare Other | Admitting: Physical Therapy

## 2018-12-30 ENCOUNTER — Encounter: Payer: Self-pay | Admitting: Physical Therapy

## 2018-12-30 DIAGNOSIS — M6281 Muscle weakness (generalized): Secondary | ICD-10-CM | POA: Diagnosis not present

## 2018-12-30 DIAGNOSIS — M25561 Pain in right knee: Secondary | ICD-10-CM | POA: Diagnosis not present

## 2018-12-30 NOTE — Therapy (Signed)
[facility] [place], [place], [address] Phone: [phone]   Fax:  [phone]  Physical Therapy Treatment  Patient Details  Name: Lisa Parker MRN: [id number] Date of Birth: 12-06-1945 Referring Provider (PT): Kathalene Frames. Mayer Camel, MD   Encounter Date: 12/30/2018  PT End of Session - 12/30/18 [id number]    Visit Number  3    Number of Visits  12    Date for PT Re-Evaluation  02/17/19    PT Start Time  0945    PT Stop Time  1033    PT Time Calculation (min)  48 min    Activity Tolerance  Patient tolerated treatment well    Behavior During Therapy  [facility] for tasks assessed/performed       Past Medical History:  Diagnosis Date  . Anxiety   . Arthritis   . Asthma   . HTN (hypertension)    "i had a gastric sleeve done and lost 100 pounds and i dont have htn anymore, my doctor, Dr Hilma Favors took me off of it "   . Hypothyroid   . Memory loss   . Seasonal allergies   . Sinus bradycardia   . Urinary tract infection     Past Surgical History:  Procedure Laterality Date  . BLEPHAROPLASTY Bilateral   . CATARACT EXTRACTION, BILATERAL     with lens   . CESAREAN SECTION    . FINGER SURGERY     left pinky  . HERNIA REPAIR  2004   inguinal , incisional, navel   . KNEE SURGERY Left    arthroscopic   . LAPAROSCOPIC GASTRIC SLEEVE RESECTION    . MASS EXCISION Left 10/20/2015   Procedure: EXCISION MASS LEFT SMALL FINGER;  Surgeon: Leanora Cover, MD;  Location: [place];  Service: Orthopedics;  Laterality: Left;  . TONSILLECTOMY AND ADENOIDECTOMY    . TOTAL KNEE ARTHROPLASTY Right 11/24/2018   Procedure: Right Knee Arthroplasty;  Surgeon: Frederik Pear, MD;  Location: [facility];  Service: Orthopedics;  Laterality: Right;    There were no vitals filed for this visit.  Subjective Assessment - 12/30/18 1240    Subjective  COVID-19 screening performed upon arrival. Patient reports increased pain and "pinching" in right lateral  aspect oft the knee.    Pertinent History  right TKA 11/24/2018    Limitations  Standing;Walking    Diagnostic tests  x-ray    Patient Stated Goals  decrease pain, improve movement    Currently in Pain?  Yes    Pain Score  5     Pain Location  Knee    Pain Orientation  Right;Lateral    Pain Descriptors / Indicators  Sharp    Pain Onset  1 to 4 weeks ago    Pain Frequency  Constant         [facility] PT Assessment - 12/30/18 0001      Assessment   Medical Diagnosis  right TKA    Referring Provider (PT)  Kathalene Frames. Mayer Camel, MD    Onset Date/Surgical Date  11/24/18    Next MD Visit  January 06, 2019    Prior Therapy  Home health therapy                   [facility] Adult PT Treatment/Exercise - 12/30/18 0001      Exercises   Exercises  Knee/Hip      Knee/Hip Exercises: Aerobic   Nustep  Level 3 x10 minutes  Modalities   Modalities  Psychologist, educational Location  right knee    Electrical Stimulation Action  IFC    Electrical Stimulation Parameters  80-150 hz x10 mins    Electrical Stimulation Goals  Pain;Edema      Vasopneumatic   Number Minutes Vasopneumatic   10 minutes    Vasopnuematic Location   Knee    Vasopneumatic Pressure  Low    Vasopneumatic Temperature   36      Manual Therapy   Manual Therapy  Passive ROM    Manual therapy comments  gentle STW/M to lateral knee and ITB, scar tissue massage to address scar tissue    Passive ROM  gentle PROM to flexion and extension to improve ROM                  PT Long Term Goals - 12/23/18 1247      PT LONG TERM GOAL #1   Title  Patient will be independent with HEP    Time  6    Period  Weeks    Status  New      PT LONG TERM GOAL #2   Title  Patient will demonstrate 120 degrees of right knee flexion AROMto improve stability during functional tasks.    Time  6    Period  Weeks    Status  New      PT LONG TERM GOAL #3    Title  Patient will demonstrate 4+/5 or greater right knee MMT in all planes to improve stability during functional tasks.    Time  6    Period  Weeks    Status  New      PT LONG TERM GOAL #4   Title  Patient will demonstrate reciprocating stair ambulation with one railing to safely enter and exit home.    Time  6    Period  Weeks    Status  New            Plan - 12/30/18 1242    Clinical Impression Statement  Secondary to increased lateral knee pain, conservative treatment performed. Patient denied any increase of pain with nustep. STW/M and scar massaging performed to which multiple adhesions found in ITB. Patient instructed to perform massaging to lateral aspect of the knee to break up scar tissue and adhesions. No adverse affects upon removal.    Stability/Clinical Decision Making  Stable/Uncomplicated    Clinical Decision Making  Low    Rehab Potential  Good    PT Frequency  2x / week    PT Duration  6 weeks    PT Treatment/Interventions  ADLs/Self Care Home Management;Electrical Stimulation;Moist Heat;Cryotherapy;Gait training;Stair training;Functional mobility training;Therapeutic activities;Therapeutic exercise;Balance training;Passive range of motion;Scar mobilization;Patient/family education;Neuromuscular re-education;Manual techniques;Vasopneumatic Device;Taping    PT Next Visit Plan  Nustep, hip and knee strengthening, end range knee extension strengthening, modalities PRN for pain relief    PT Home Exercise Plan  see patient education section    Consulted and Agree with Plan of Care  Patient       Patient will benefit from skilled therapeutic intervention in order to improve the following deficits and impairments:  Difficulty walking, Abnormal gait, Pain, Decreased activity tolerance, Decreased balance, Decreased range of motion, Decreased strength, Increased edema  Visit Diagnosis: Acute pain of right knee  Muscle weakness (generalized)     Problem  List Patient Active Problem List   Diagnosis Date  Noted  . Total knee replacement status, right 11/24/2018  . Osteoarthritis of right knee 11/20/2018  . Mild cognitive impairment 10/09/2016  . High blood pressure 09/15/2013  . Hypothyroidism 09/15/2013  . Obesity (BMI 30-39.9) 09/15/2013  . S/P arthroscopy of left knee 10/02/2011  . Lateral meniscal tear 01/18/2011    Gabriela Eves, PT, DPT 12/30/2018, 12:48 PM  [facility] [address] [place], [place], [address] Phone: [phone]   Fax:  [phone]  Name: Lisa Parker MRN: [id number] Date of Birth: 18-Mar-1946

## 2019-01-01 ENCOUNTER — Other Ambulatory Visit: Payer: Self-pay

## 2019-01-01 ENCOUNTER — Ambulatory Visit: Payer: Medicare Other | Admitting: Physical Therapy

## 2019-01-01 DIAGNOSIS — M25561 Pain in right knee: Secondary | ICD-10-CM | POA: Diagnosis not present

## 2019-01-01 DIAGNOSIS — M6281 Muscle weakness (generalized): Secondary | ICD-10-CM | POA: Diagnosis not present

## 2019-01-01 NOTE — Therapy (Signed)
[facility] [place], [place], [address] Phone: [phone]   Fax:  [phone]  Physical Therapy Treatment  Patient Details  Name: Lisa Parker MRN: [id number] Date of Birth: 11-05-1945 Referring Provider (PT): Kathalene Frames. Mayer Camel, MD   Encounter Date: 01/01/2019  PT End of Session - 01/01/19 1152    Visit Number  4    Number of Visits  12    Date for PT Re-Evaluation  02/17/19    PT Start Time  1114    PT Stop Time  1158    PT Time Calculation (min)  44 min    Activity Tolerance  Patient tolerated treatment well    Behavior During Therapy  [facility] for tasks assessed/performed       Past Medical History:  Diagnosis Date  . Anxiety   . Arthritis   . Asthma   . HTN (hypertension)    "i had a gastric sleeve done and lost 100 pounds and i dont have htn anymore, my doctor, Dr Hilma Favors took me off of it "   . Hypothyroid   . Memory loss   . Seasonal allergies   . Sinus bradycardia   . Urinary tract infection     Past Surgical History:  Procedure Laterality Date  . BLEPHAROPLASTY Bilateral   . CATARACT EXTRACTION, BILATERAL     with lens   . CESAREAN SECTION    . FINGER SURGERY     left pinky  . HERNIA REPAIR  2004   inguinal , incisional, navel   . KNEE SURGERY Left    arthroscopic   . LAPAROSCOPIC GASTRIC SLEEVE RESECTION    . MASS EXCISION Left 10/20/2015   Procedure: EXCISION MASS LEFT SMALL FINGER;  Surgeon: Leanora Cover, MD;  Location: [place];  Service: Orthopedics;  Laterality: Left;  . TONSILLECTOMY AND ADENOIDECTOMY    . TOTAL KNEE ARTHROPLASTY Right 11/24/2018   Procedure: Right Knee Arthroplasty;  Surgeon: Frederik Pear, MD;  Location[facility];  Service: Orthopedics;  Laterality: Right;    There were no vitals filed for this visit.  Subjective Assessment - 01/01/19 1117    Subjective  COVID-19 screening performed upon arrival. Patient reported some ongoing discomfort in knee.    Pertinent History  right TKA 11/24/2018    Limitations  Standing;Walking    Diagnostic tests  x-ray    Patient Stated Goals  decrease pain, improve movement    Currently in Pain?  Yes    Pain Score  5     Pain Location  Knee    Pain Orientation  Right    Pain Descriptors / Indicators  Discomfort    Pain Type  Surgical pain    Pain Onset  More than a month ago    Pain Frequency  Constant    Aggravating Factors   increased activity    Pain Relieving Factors  meds and rest         [facility] PT Assessment - 01/01/19 0001      AROM   AROM Assessment Site  Knee    Right/Left Knee  Right    Right Knee Flexion  113      PROM   PROM Assessment Site  Knee    Right/Left Knee  Right    Right Knee Flexion  117                   [facility] Adult PT Treatment/Exercise - 01/01/19 0001  Knee/Hip Exercises: Aerobic   Nustep  Level 3 x10 minutes      Knee/Hip Exercises: Seated   Long Arc Quad  Strengthening;Right;3 sets;10 reps;Weights    Long Arc Quad Weight  3 lbs.    Hamstring Curl  Strengthening;Right;20 reps    Hamstring Limitations  red theraband      Knee/Hip Exercises: Supine   Straight Leg Raises  AROM;Right;2 sets;10 reps    Other Supine Knee/Hip Exercises  clam shells 2x10 red theraband      Electrical Stimulation   Electrical Stimulation Location  right knee    Electrical Stimulation Action  IFC    Electrical Stimulation Parameters  80-150hz  x22min    Electrical Stimulation Goals  Pain;Edema      Vasopneumatic   Number Minutes Vasopneumatic   10 minutes    Vasopnuematic Location   Knee    Vasopneumatic Pressure  Low      Manual Therapy   Manual Therapy  Passive ROM    Manual therapy comments  gentle STW/M to lateral knee and ITB, scar tissue massage to address scar tissue    Passive ROM  gentle PROM to flexion and extension to improve ROM                  PT Long Term Goals - 01/01/19 1119      PT LONG TERM GOAL #1   Title  Patient will be  independent with HEP    Time  6    Period  Weeks    Status  On-going      PT LONG TERM GOAL #2   Title  Patient will demonstrate 120 degrees of right knee flexion AROMto improve stability during functional tasks.    Time  6    Period  Weeks    Status  On-going      PT LONG TERM GOAL #3   Title  Patient will demonstrate 4+/5 or greater right knee MMT in all planes to improve stability during functional tasks.    Time  6    Period  Weeks    Status  On-going      PT LONG TERM GOAL #4   Title  Patient will demonstrate reciprocating stair ambulation with one railing to safely enter and exit home.    Time  6    Period  Weeks    Status  On-going            Plan - 01/01/19 1155    Clinical Impression Statement  Patient tolerated treatment well today. Patient has improved with ROM today and able to progress with exercises today for right LE. Patient has palpable tightness and discomfort in lateral right knee. Goals progressing.    Stability/Clinical Decision Making  Stable/Uncomplicated    Rehab Potential  Good    PT Frequency  2x / week    PT Duration  6 weeks    PT Treatment/Interventions  ADLs/Self Care Home Management;Electrical Stimulation;Moist Heat;Cryotherapy;Gait training;Stair training;Functional mobility training;Therapeutic activities;Therapeutic exercise;Balance training;Passive range of motion;Scar mobilization;Patient/family education;Neuromuscular re-education;Manual techniques;Vasopneumatic Device;Taping    PT Next Visit Plan  Nustep, hip and knee strengthening, end range knee extension strengthening, modalities PRN for pain relief    Consulted and Agree with Plan of Care  Patient       Patient will benefit from skilled therapeutic intervention in order to improve the following deficits and impairments:  Difficulty walking, Abnormal gait, Pain, Decreased activity tolerance, Decreased balance, Decreased range of motion, Decreased strength, Increased  edema  Visit  Diagnosis: Muscle weakness (generalized)  Acute pain of right knee     Problem List Patient Active Problem List   Diagnosis Date Noted  . Total knee replacement status, right 11/24/2018  . Osteoarthritis of right knee 11/20/2018  . Mild cognitive impairment 10/09/2016  . High blood pressure 09/15/2013  . Hypothyroidism 09/15/2013  . Obesity (BMI 30-39.9) 09/15/2013  . S/P arthroscopy of left knee 10/02/2011  . Lateral meniscal tear 01/18/2011    Annis Lagoy P, PTA 01/01/2019, 12:01 PM  [facility] [address] [place], [place], [address] Phone: [phone]   Fax:  [phone]  Name: Delisha Patil Juncaj MRN: [id number] Date of Birth: 1946-01-06

## 2019-01-06 DIAGNOSIS — Z9889 Other specified postprocedural states: Secondary | ICD-10-CM | POA: Diagnosis not present

## 2019-01-06 DIAGNOSIS — M25561 Pain in right knee: Secondary | ICD-10-CM | POA: Diagnosis not present

## 2019-01-07 ENCOUNTER — Ambulatory Visit: Payer: Medicare Other | Attending: Orthopedic Surgery | Admitting: Physical Therapy

## 2019-01-07 ENCOUNTER — Other Ambulatory Visit: Payer: Self-pay

## 2019-01-07 ENCOUNTER — Encounter: Payer: Self-pay | Admitting: Physical Therapy

## 2019-01-07 DIAGNOSIS — M25561 Pain in right knee: Secondary | ICD-10-CM | POA: Diagnosis not present

## 2019-01-07 DIAGNOSIS — M6281 Muscle weakness (generalized): Secondary | ICD-10-CM | POA: Diagnosis not present

## 2019-01-07 NOTE — Therapy (Signed)
[facility] [place], [place], [address] Phone: [phone]   Fax:  [phone]  Physical Therapy Treatment  Patient Details  Name: Lisa Parker MRN: [id number] Date of Birth: [birth year] Referring Provider (PT): Kathalene Frames. Mayer Camel, MD   Encounter Date: 01/07/2019  PT End of Session - 01/07/19 0948    Visit Number  5    Number of Visits  12    Date for PT Re-Evaluation  02/17/19    PT Start Time  0946    PT Stop Time  1034    PT Time Calculation (min)  48 min    Activity Tolerance  Patient tolerated treatment well    Behavior During Therapy  [facility] for tasks assessed/performed       Past Medical History:  Diagnosis Date  . Anxiety   . Arthritis   . Asthma   . HTN (hypertension)    "i had a gastric sleeve done and lost 100 pounds and i dont have htn anymore, my doctor, Dr Hilma Favors took me off of it "   . Hypothyroid   . Memory loss   . Seasonal allergies   . Sinus bradycardia   . Urinary tract infection     Past Surgical History:  Procedure Laterality Date  . BLEPHAROPLASTY Bilateral   . CATARACT EXTRACTION, BILATERAL     with lens   . CESAREAN SECTION    . FINGER SURGERY     left pinky  . HERNIA REPAIR  2004   inguinal , incisional, navel   . KNEE SURGERY Left    arthroscopic   . LAPAROSCOPIC GASTRIC SLEEVE RESECTION    . MASS EXCISION Left 10/20/2015   Procedure: EXCISION MASS LEFT SMALL FINGER;  Surgeon: Leanora Cover, MD;  Location: [place];  Service: Orthopedics;  Laterality: Left;  . TONSILLECTOMY AND ADENOIDECTOMY    . TOTAL KNEE ARTHROPLASTY Right 11/24/2018   Procedure: Right Knee Arthroplasty;  Surgeon: Frederik Pear, MD;  Location[facility];  Service: Orthopedics;  Laterality: Right;    There were no vitals filed for this visit.  Subjective Assessment - 01/07/19 0947    Subjective  COVID-19 screening performed upon arrival. Patient reported some ongoing discomfort in knee after sitting  too quickly and her knee went out to the side. Dr. Mayer Camel checked her knee and said that it was okay. Took pain medication Sunday night due to pain but otherwise has been taking extra strength tylenol.    Pertinent History  right TKA 11/24/2018    Limitations  Standing;Walking    Diagnostic tests  x-ray    Patient Stated Goals  decrease pain, improve movement    Currently in Pain?  Yes    Pain Score  2     Pain Location  Knee    Pain Orientation  Right    Pain Descriptors / Indicators  Sore    Pain Type  Surgical pain    Pain Onset  More than a month ago    Pain Frequency  Intermittent         [facility] PT Assessment - 01/07/19 0001      Assessment   Medical Diagnosis  right TKA    Referring Provider (PT)  Kathalene Frames. Mayer Camel, MD    Onset Date/Surgical Date  11/24/18    Prior Therapy  Home health therapy      Precautions   Precautions  None      Restrictions   Weight Bearing Restrictions  No                   [facility] Adult PT Treatment/Exercise - 01/07/19 0001      Knee/Hip Exercises: Aerobic   Nustep  L5, seat 10 x10 min      Knee/Hip Exercises: Seated   Long Arc Quad  Strengthening;Right;3 sets;10 reps;Weights    Long Arc Quad Weight  3 lbs.    Clamshell with TheraBand  Red   x20 reps   Hamstring Curl  Strengthening;Right;20 reps    Hamstring Limitations  red theraband      Knee/Hip Exercises: Supine   Terminal Knee Extension  Other (comment)   Attempted but uncomfortable   Straight Leg Raises  AROM;Right;2 sets;10 reps      Modalities   Modalities  Management consultant  IFC    Electrical Stimulation Parameters  80-150 hz x15 min    Electrical Stimulation Goals  Pain;Edema      Vasopneumatic   Number Minutes Vasopneumatic   15 minutes    Vasopnuematic Location   Knee    Vasopneumatic Pressure  Low    Vasopneumatic Temperature   34       Manual Therapy   Manual Therapy  Passive ROM    Passive ROM  PROM of R knee into flexion                  PT Long Term Goals - 01/01/19 1119      PT LONG TERM GOAL #1   Title  Patient will be independent with HEP    Time  6    Period  Weeks    Status  On-going      PT LONG TERM GOAL #2   Title  Patient will demonstrate 120 degrees of right knee flexion AROMto improve stability during functional tasks.    Time  6    Period  Weeks    Status  On-going      PT LONG TERM GOAL #3   Title  Patient will demonstrate 4+/5 or greater right knee MMT in all planes to improve stability during functional tasks.    Time  6    Period  Weeks    Status  On-going      PT LONG TERM GOAL #4   Title  Patient will demonstrate reciprocating stair ambulation with one railing to safely enter and exit home.    Time  6    Period  Weeks    Status  On-going            Plan - 01/07/19 1025    Clinical Impression Statement  Patient tolerated today's treatment fairly well with low level R knee pain. Patient able to complete therex although limited by discomfort from theraband. Good ROM noted during PROM session as well into flexion. Normal modalities repsonse noted following removal of the modalities.    Stability/Clinical Decision Making  Stable/Uncomplicated    Rehab Potential  Good    PT Frequency  2x / week    PT Duration  6 weeks    PT Treatment/Interventions  ADLs/Self Care Home Management;Electrical Stimulation;Moist Heat;Cryotherapy;Gait training;Stair training;Functional mobility training;Therapeutic activities;Therapeutic exercise;Balance training;Passive range of motion;Scar mobilization;Patient/family education;Neuromuscular re-education;Manual techniques;Vasopneumatic Device;Taping    PT Next Visit Plan  Nustep, hip and knee strengthening, end range knee extension strengthening, modalities PRN for pain relief  PT Home Exercise Plan  see patient education section     Consulted and Agree with Plan of Care  Patient       Patient will benefit from skilled therapeutic intervention in order to improve the following deficits and impairments:  Difficulty walking, Abnormal gait, Pain, Decreased activity tolerance, Decreased balance, Decreased range of motion, Decreased strength, Increased edema  Visit Diagnosis: Muscle weakness (generalized)  Acute pain of right knee     Problem List Patient Active Problem List   Diagnosis Date Noted  . Total knee replacement status, right 11/24/2018  . Osteoarthritis of right knee 11/20/2018  . Mild cognitive impairment 10/09/2016  . High blood pressure 09/15/2013  . Hypothyroidism 09/15/2013  . Obesity (BMI 30-39.9) 09/15/2013  . S/P arthroscopy of left knee 10/02/2011  . Lateral meniscal tear 01/18/2011    Standley Brooking, PTA 01/07/2019, 10:50 AM  [facility] [address] [place], [place], [address] Phone: [phone]   Fax:  [phone]  Name: Dolora Stubbins Noller MRN: [id number] Date of Birth: 12/25/1945

## 2019-01-08 ENCOUNTER — Ambulatory Visit: Payer: Medicare Other | Admitting: Physical Therapy

## 2019-01-08 ENCOUNTER — Encounter: Payer: Self-pay | Admitting: Physical Therapy

## 2019-01-08 ENCOUNTER — Other Ambulatory Visit: Payer: Self-pay

## 2019-01-08 DIAGNOSIS — M25561 Pain in right knee: Secondary | ICD-10-CM | POA: Diagnosis not present

## 2019-01-08 DIAGNOSIS — M6281 Muscle weakness (generalized): Secondary | ICD-10-CM

## 2019-01-08 NOTE — Therapy (Signed)
[facility] [place], [place], [address] Phone: [phone]   Fax:  [phone]  Physical Therapy Treatment  Patient Details  Name: Lisa Parker MRN: [id number] Date of Birth: 1945/06/17 Referring Provider (PT): Kathalene Frames. Mayer Camel, MD   Encounter Date: 01/08/2019  PT End of Session - 01/08/19 1043    Visit Number  6    Number of Visits  12    Date for PT Re-Evaluation  02/17/19    PT Start Time  0945    PT Stop Time  8676    PT Time Calculation (min)  59 min    Activity Tolerance  Patient tolerated treatment well    Behavior During Therapy  [facility] for tasks assessed/performed       Past Medical History:  Diagnosis Date  . Anxiety   . Arthritis   . Asthma   . HTN (hypertension)    "i had a gastric sleeve done and lost 100 pounds and i dont have htn anymore, my doctor, Dr Hilma Favors took me off of it "   . Hypothyroid   . Memory loss   . Seasonal allergies   . Sinus bradycardia   . Urinary tract infection     Past Surgical History:  Procedure Laterality Date  . BLEPHAROPLASTY Bilateral   . CATARACT EXTRACTION, BILATERAL     with lens   . CESAREAN SECTION    . FINGER SURGERY     left pinky  . HERNIA REPAIR  2004   inguinal , incisional, navel   . KNEE SURGERY Left    arthroscopic   . LAPAROSCOPIC GASTRIC SLEEVE RESECTION    . MASS EXCISION Left 10/20/2015   Procedure: EXCISION MASS LEFT SMALL FINGER;  Surgeon: Leanora Cover, MD;  Location: [place];  Service: Orthopedics;  Laterality: Left;  . TONSILLECTOMY AND ADENOIDECTOMY    . TOTAL KNEE ARTHROPLASTY Right 11/24/2018   Procedure: Right Knee Arthroplasty;  Surgeon: Frederik Pear, MD;  Location: [facility];  Service: Orthopedics;  Laterality: Right;    There were no vitals filed for this visit.  Subjective Assessment - 01/08/19 0949    Subjective  COVID-19 screening performed upon arrival. Patient reported that her knee feels less discomfort today    Pertinent History  right TKA 11/24/2018    Limitations  Standing;Walking    Diagnostic tests  x-ray    Patient Stated Goals  decrease pain, improve movement    Currently in Pain?  Yes    Pain Score  2     Pain Location  Knee    Pain Orientation  Right    Pain Descriptors / Indicators  Sore    Pain Type  Surgical pain    Pain Onset  More than a month ago    Pain Frequency  Intermittent    Aggravating Factors   prolong activity    Pain Relieving Factors  meds and at rest         [facility] PT Assessment - 01/08/19 0001      AROM   AROM Assessment Site  Knee    Right/Left Knee  Right    Right Knee Flexion  115      PROM   PROM Assessment Site  Knee    Right/Left Knee  Right    Right Knee Flexion  120                   [facility] Adult PT Treatment/Exercise - 01/08/19 0001  Knee/Hip Exercises: Aerobic   Nustep  L5, seat 10 x10 min      Knee/Hip Exercises: Seated   Long Arc Quad  Strengthening;Right;3 sets;10 reps;Weights    Long Arc Quad Weight  3 lbs.    Hamstring Curl  Strengthening;Right;20 reps;10 reps    Hamstring Limitations  red theraband      Knee/Hip Exercises: Supine   Short Arc Quad Sets  Right;2 sets;10 reps    Short Arc Quad Sets Limitations  3#    Straight Leg Raises  AROM;Right;2 sets;10 reps    Other Supine Knee/Hip Exercises  clam shells x30 red theraband      Electrical Stimulation   Electrical Stimulation Location  R knee    Electrical Stimulation Action  IFC    Electrical Stimulation Parameters  80-'150hz'  x41mn    Electrical Stimulation Goals  Pain;Edema      Vasopneumatic   Number Minutes Vasopneumatic   15 minutes    Vasopnuematic Location   Knee    Vasopneumatic Pressure  Low      Manual Therapy   Manual Therapy  Passive ROM;Soft tissue mobilization    Manual therapy comments  manual gentle STW to medial knee to decrease pain and tone    Passive ROM  PROM of R knee into flexion                  PT Long Term Goals -  01/08/19 1014      PT LONG TERM GOAL #1   Title  Patient will be independent with HEP    Time  6    Period  Weeks    Status  On-going      PT LONG TERM GOAL #2   Title  Patient will demonstrate 120 degrees of right knee flexion AROMto improve stability during functional tasks.    Period  Weeks    Status  On-going   AROM 115 degrees 01/08/19     PT LONG TERM GOAL #3   Title  Patient will demonstrate 4+/5 or greater right knee MMT in all planes to improve stability during functional tasks.    Time  6    Period  Weeks    Status  On-going      PT LONG TERM GOAL #4   Title  Patient will demonstrate reciprocating stair ambulation with one railing to safely enter and exit home.    Time  6    Period  Weeks    Status  Achieved   able to perform with ease 01/08/19           Plan - 01/08/19 1033    Clinical Impression Statement  Patient tolerated treatment well today. Patient feels better after she tweaked her knee a few days ago. Patient has improved with right knee active and passive ROM today. Patient is able to perform stairs with no difficulty. Patient met LTG #4 today with others ongoing due to strength and full active ROM limitations.    Stability/Clinical Decision Making  Stable/Uncomplicated    Rehab Potential  Good    PT Frequency  2x / week    PT Duration  6 weeks    PT Treatment/Interventions  ADLs/Self Care Home Management;Electrical Stimulation;Moist Heat;Cryotherapy;Gait training;Stair training;Functional mobility training;Therapeutic activities;Therapeutic exercise;Balance training;Passive range of motion;Scar mobilization;Patient/family education;Neuromuscular re-education;Manual techniques;Vasopneumatic Device;Taping    PT Next Visit Plan  Nustep, hip and knee strengthening, end range knee extension strengthening, modalities PRN for pain relief    Consulted and Agree with  Plan of Care  Patient       Patient will benefit from skilled therapeutic intervention in order  to improve the following deficits and impairments:  Difficulty walking, Abnormal gait, Pain, Decreased activity tolerance, Decreased balance, Decreased range of motion, Decreased strength, Increased edema  Visit Diagnosis: Muscle weakness (generalized)  Acute pain of right knee     Problem List Patient Active Problem List   Diagnosis Date Noted  . Total knee replacement status, right 11/24/2018  . Osteoarthritis of right knee 11/20/2018  . Mild cognitive impairment 10/09/2016  . High blood pressure 09/15/2013  . Hypothyroidism 09/15/2013  . Obesity (BMI 30-39.9) 09/15/2013  . S/P arthroscopy of left knee 10/02/2011  . Lateral meniscal tear 01/18/2011    ,  P, PTA 01/08/2019, 10:49 AM  [facility] [place], [place], [address] Phone: [phone]   Fax:  [phone]  Name: Lisa Parker MRN: [id number] Date of Birth: 1945-06-21

## 2019-01-13 ENCOUNTER — Ambulatory Visit: Payer: Medicare Other | Admitting: Physical Therapy

## 2019-01-13 ENCOUNTER — Other Ambulatory Visit: Payer: Self-pay

## 2019-01-13 ENCOUNTER — Encounter: Payer: Self-pay | Admitting: Physical Therapy

## 2019-01-13 DIAGNOSIS — M6281 Muscle weakness (generalized): Secondary | ICD-10-CM | POA: Diagnosis not present

## 2019-01-13 DIAGNOSIS — M25561 Pain in right knee: Secondary | ICD-10-CM | POA: Diagnosis not present

## 2019-01-13 NOTE — Therapy (Signed)
[facility] [place], [place], [address] Phone: [phone]   Fax:  [phone]  Physical Therapy Treatment  Patient Details  Name: Lisa Parker MRN: [id number] Date of Birth: 17-Mar-1946 Referring Provider (PT): Kathalene Frames. Mayer Camel, MD   Encounter Date: 01/13/2019  PT End of Session - 01/13/19 1333    Visit Number  7    Number of Visits  12    Date for PT Re-Evaluation  02/17/19    PT Start Time  1300    PT Stop Time  1345    PT Time Calculation (min)  45 min    Activity Tolerance  Patient tolerated treatment well    Behavior During Therapy  [facility] for tasks assessed/performed       Past Medical History:  Diagnosis Date  . Anxiety   . Arthritis   . Asthma   . HTN (hypertension)    "i had a gastric sleeve done and lost 100 pounds and i dont have htn anymore, my doctor, Dr Hilma Favors took me off of it "   . Hypothyroid   . Memory loss   . Seasonal allergies   . Sinus bradycardia   . Urinary tract infection     Past Surgical History:  Procedure Laterality Date  . BLEPHAROPLASTY Bilateral   . CATARACT EXTRACTION, BILATERAL     with lens   . CESAREAN SECTION    . FINGER SURGERY     left pinky  . HERNIA REPAIR  2004   inguinal , incisional, navel   . KNEE SURGERY Left    arthroscopic   . LAPAROSCOPIC GASTRIC SLEEVE RESECTION    . MASS EXCISION Left 10/20/2015   Procedure: EXCISION MASS LEFT SMALL FINGER;  Surgeon: Leanora Cover, MD;  Location: [place];  Service: Orthopedics;  Laterality: Left;  . TONSILLECTOMY AND ADENOIDECTOMY    . TOTAL KNEE ARTHROPLASTY Right 11/24/2018   Procedure: Right Knee Arthroplasty;  Surgeon: Frederik Pear, MD;  Location[facility];  Service: Orthopedics;  Laterality: Right;    There were no vitals filed for this visit.  Subjective Assessment - 01/13/19 1300    Subjective  COVID 19 screening performed on patient upon arrival. Reports walking more normally now.    Pertinent  History  right TKA 11/24/2018    Limitations  Standing;Walking    Diagnostic tests  x-ray    Patient Stated Goals  decrease pain, improve movement    Currently in Pain?  No/denies         [facility] PT Assessment - 01/13/19 0001      Assessment   Medical Diagnosis  right TKA    Referring Provider (PT)  Kathalene Frames. Mayer Camel, MD    Onset Date/Surgical Date  11/24/18    Prior Therapy  Home health therapy      Precautions   Precautions  None      Restrictions   Weight Bearing Restrictions  No                   [facility] Adult PT Treatment/Exercise - 01/13/19 0001      Knee/Hip Exercises: Aerobic   Nustep  L5 x10 min      Knee/Hip Exercises: Standing   Terminal Knee Extension  Strengthening;Right;20 reps;Theraband    Theraband Level (Terminal Knee Extension)  Level 2 (Red)    Forward Step Up  Right;10 reps;Hand Hold: 2;Step Height: 6"      Knee/Hip Exercises: Seated   Long Arc  Quad  Strengthening;Right;3 sets;10 reps;Weights    Long [employer]  3 lbs.    Clamshell with TheraBand  Red   x20 reps   Hamstring Curl  Strengthening;Right;20 reps;10 reps    Hamstring Limitations  red theraband      Modalities   Modalities  Psychologist, educational Location  R knee    Electrical Stimulation Action  IFC    Electrical Stimulation Parameters  80-150 hz x15 min    Electrical Stimulation Goals  Edema      Vasopneumatic   Number Minutes Vasopneumatic   15 minutes    Vasopnuematic Location   Knee    Vasopneumatic Pressure  Low    Vasopneumatic Temperature   34                  PT Long Term Goals - 01/08/19 1014      PT LONG TERM GOAL #1   Title  Patient will be independent with HEP    Time  6    Period  Weeks    Status  On-going      PT LONG TERM GOAL #2   Title  Patient will demonstrate 120 degrees of right knee flexion AROMto improve stability during functional tasks.    Period  Weeks     Status  On-going   AROM 115 degrees 01/08/19     PT LONG TERM GOAL #3   Title  Patient will demonstrate 4+/5 or greater right knee MMT in all planes to improve stability during functional tasks.    Time  6    Period  Weeks    Status  On-going      PT LONG TERM GOAL #4   Title  Patient will demonstrate reciprocating stair ambulation with one railing to safely enter and exit home.    Time  6    Period  Weeks    Status  Achieved   able to perform with ease 01/08/19           Plan - 01/13/19 1337    Clinical Impression Statement  Patient presented in clinic with no complaints of any R knee pain. Patient reporting more functional independence with stairs and overall ambulation without pain. Patient able to complete step ups within clinic well but limited with reciprical stair gait due to lack of trust with L knee. Normal modalities response noted following removal of the modalities.    Stability/Clinical Decision Making  Stable/Uncomplicated    Rehab Potential  Good    PT Frequency  2x / week    PT Duration  6 weeks    PT Treatment/Interventions  ADLs/Self Care Home Management;Electrical Stimulation;Moist Heat;Cryotherapy;Gait training;Stair training;Functional mobility training;Therapeutic activities;Therapeutic exercise;Balance training;Passive range of motion;Scar mobilization;Patient/family education;Neuromuscular re-education;Manual techniques;Vasopneumatic Device;Taping    PT Next Visit Plan  Nustep, hip and knee strengthening, end range knee extension strengthening, modalities PRN for pain relief    PT Home Exercise Plan  see patient education section    Consulted and Agree with Plan of Care  Patient       Patient will benefit from skilled therapeutic intervention in order to improve the following deficits and impairments:  Difficulty walking, Abnormal gait, Pain, Decreased activity tolerance, Decreased balance, Decreased range of motion, Decreased strength, Increased edema  Visit  Diagnosis: Muscle weakness (generalized)  Acute pain of right knee     Problem List Patient Active Problem List   Diagnosis Date  Noted  . Total knee replacement status, right 11/24/2018  . Osteoarthritis of right knee 11/20/2018  . Mild cognitive impairment 10/09/2016  . High blood pressure 09/15/2013  . Hypothyroidism 09/15/2013  . Obesity (BMI 30-39.9) 09/15/2013  . S/P arthroscopy of left knee 10/02/2011  . Lateral meniscal tear 01/18/2011    Standley Brooking, PTA 01/13/2019, 2:27 PM  [facility] [address] [place], [place], [address] Phone: [phone]   Fax:  [phone]  Name: [employer] Heit Deutschman MRN: [id number] Date of Birth: 1945/07/23

## 2019-01-15 ENCOUNTER — Other Ambulatory Visit: Payer: Self-pay

## 2019-01-15 ENCOUNTER — Ambulatory Visit: Payer: Medicare Other | Admitting: Physical Therapy

## 2019-01-15 ENCOUNTER — Encounter: Payer: Self-pay | Admitting: Physical Therapy

## 2019-01-15 DIAGNOSIS — M25561 Pain in right knee: Secondary | ICD-10-CM

## 2019-01-15 DIAGNOSIS — M6281 Muscle weakness (generalized): Secondary | ICD-10-CM | POA: Diagnosis not present

## 2019-01-15 NOTE — Therapy (Signed)
[facility] [place], [place], [address] Phone: [phone]   Fax:  [phone]  Physical Therapy Treatment  Patient Details  Name: Lisa Parker MRN: [id number] Date of Birth: 1945/09/08 Referring Provider (PT): Kathalene Frames. Mayer Camel, MD   Encounter Date: 01/15/2019  PT End of Session - 01/15/19 0949    Visit Number  8    Number of Visits  12    Date for PT Re-Evaluation  02/17/19    PT Start Time  0947    PT Stop Time  1035    PT Time Calculation (min)  48 min    Activity Tolerance  Patient tolerated treatment well    Behavior During Therapy  [facility] for tasks assessed/performed       Past Medical History:  Diagnosis Date  . Anxiety   . Arthritis   . Asthma   . HTN (hypertension)    "i had a gastric sleeve done and lost 100 pounds and i dont have htn anymore, my doctor, Dr Hilma Favors took me off of it "   . Hypothyroid   . Memory loss   . Seasonal allergies   . Sinus bradycardia   . Urinary tract infection     Past Surgical History:  Procedure Laterality Date  . BLEPHAROPLASTY Bilateral   . CATARACT EXTRACTION, BILATERAL     with lens   . CESAREAN SECTION    . FINGER SURGERY     left pinky  . HERNIA REPAIR  2004   inguinal , incisional, navel   . KNEE SURGERY Left    arthroscopic   . LAPAROSCOPIC GASTRIC SLEEVE RESECTION    . MASS EXCISION Left 10/20/2015   Procedure: EXCISION MASS LEFT SMALL FINGER;  Surgeon: Leanora Cover, MD;  Location: [place];  Service: Orthopedics;  Laterality: Left;  . TONSILLECTOMY AND ADENOIDECTOMY    . TOTAL KNEE ARTHROPLASTY Right 11/24/2018   Procedure: Right Knee Arthroplasty;  Surgeon: Frederik Pear, MD;  Location[facility];  Service: Orthopedics;  Laterality: Right;    There were no vitals filed for this visit.  Subjective Assessment - 01/15/19 0949    Subjective  COVID 19 screening performed on patient upon arrival. Reports no pain in the right knee and feels like  she has made significant progress.    Pertinent History  right TKA 11/24/2018    Limitations  Standing;Walking    Diagnostic tests  x-ray    Patient Stated Goals  decrease pain, improve movement    Currently in Pain?  No/denies         [facility] PT Assessment - 01/15/19 0001      Assessment   Medical Diagnosis  right TKA    Referring Provider (PT)  Kathalene Frames. Mayer Camel, MD    Onset Date/Surgical Date  11/24/18      AROM   Right/Left Knee  Right    Right Knee Extension  3    Right Knee Flexion  115      PROM   PROM Assessment Site  Knee    Right/Left Knee  Right    Right Knee Flexion  119                   [facility] Adult PT Treatment/Exercise - 01/15/19 0001      Knee/Hip Exercises: Aerobic   Nustep  L5 x10 min      Knee/Hip Exercises: Seated   Long Arc Quad  Strengthening;Right;3 sets;10 reps;Weights  Long Arc Quad Weight  3 lbs.    Clamshell with TheraBand  Red   x30   Hamstring Curl  Strengthening;Right;20 reps;10 reps    Hamstring Limitations  red theraband      Knee/Hip Exercises: Supine   Short Arc Quad Sets  Right;10 reps;3 Public relations account executive Limitations  3#      Modalities   Modalities  Theme park manager Action  IFC    Electrical Stimulation Parameters  80-150 hz x15 mins    Electrical Stimulation Goals  Edema      Vasopneumatic   Number Minutes Vasopneumatic   15 minutes    Vasopnuematic Location   Knee    Vasopneumatic Pressure  Low    Vasopneumatic Temperature   34                  PT Long Term Goals - 01/08/19 1014      PT LONG TERM GOAL #1   Title  Patient will be independent with HEP    Time  6    Period  Weeks    Status  On-going      PT LONG TERM GOAL #2   Title  Patient will demonstrate 120 degrees of right knee flexion AROMto improve stability during functional tasks.    Period  Weeks    Status   On-going   AROM 115 degrees 01/08/19     PT LONG TERM GOAL #3   Title  Patient will demonstrate 4+/5 or greater right knee MMT in all planes to improve stability during functional tasks.    Time  6    Period  Weeks    Status  On-going      PT LONG TERM GOAL #4   Title  Patient will demonstrate reciprocating stair ambulation with one railing to safely enter and exit home.    Time  6    Period  Weeks    Status  Achieved   able to perform with ease 01/08/19           Plan - 01/15/19 1023    Clinical Impression Statement  Patient responded well to therapy with no reports of increased pain. Patient required intermittent cuing for upright posture and not to lean back while performing seated clam shells. Patient's scar is well healed with minimal papable scar tissue thorughout scar. Patient right knee AROM measured at 3-115 degrees, PROM measured at 0-119 degrees. No adverse affects noted upon removal of modalities.    Stability/Clinical Decision Making  Stable/Uncomplicated    Clinical Decision Making  Low    Rehab Potential  Good    PT Frequency  2x / week    PT Duration  6 weeks    PT Treatment/Interventions  ADLs/Self Care Home Management;Electrical Stimulation;Moist Heat;Cryotherapy;Gait training;Stair training;Functional mobility training;Therapeutic activities;Therapeutic exercise;Balance training;Passive range of motion;Scar mobilization;Patient/family education;Neuromuscular re-education;Manual techniques;Vasopneumatic Device;Taping    PT Next Visit Plan  Nustep, hip and knee strengthening, end range knee extension strengthening, modalities PRN for pain relief    Consulted and Agree with Plan of Care  Patient       Patient will benefit from skilled therapeutic intervention in order to improve the following deficits and impairments:  Difficulty walking, Abnormal gait, Pain, Decreased activity tolerance, Decreased balance, Decreased range of motion, Decreased strength, Increased  edema  Visit Diagnosis: Muscle weakness (generalized)  Acute pain of right knee     Problem List Patient Active Problem List   Diagnosis Date Noted  . Total knee replacement status, right 11/24/2018  . Osteoarthritis of right knee 11/20/2018  . Mild cognitive impairment 10/09/2016  . High blood pressure 09/15/2013  . Hypothyroidism 09/15/2013  . Obesity (BMI 30-39.9) 09/15/2013  . S/P arthroscopy of left knee 10/02/2011  . Lateral meniscal tear 01/18/2011   Gabriela Eves, PT, DPT 01/15/2019, 10:29 AM  [facility] [place], [place], [address] Phone: [phone]   Fax:  [phone]  Name: Jadelyn Seitz Willets MRN: [id number] Date of Birth: 21-Nov-1945

## 2019-01-20 ENCOUNTER — Encounter: Payer: Self-pay | Admitting: Physical Therapy

## 2019-01-20 ENCOUNTER — Ambulatory Visit: Payer: Medicare Other | Admitting: Physical Therapy

## 2019-01-20 ENCOUNTER — Other Ambulatory Visit: Payer: Self-pay

## 2019-01-20 DIAGNOSIS — M25561 Pain in right knee: Secondary | ICD-10-CM

## 2019-01-20 DIAGNOSIS — M6281 Muscle weakness (generalized): Secondary | ICD-10-CM | POA: Diagnosis not present

## 2019-01-20 NOTE — Therapy (Signed)
[facility] [place], [place], [address] Phone: [phone]   Fax:  [phone]  Physical Therapy Treatment  Patient Details  Name: Lisa Parker MRN: [id number] Date of Birth: [birth year] Referring Provider (PT): Kathalene Frames. Mayer Camel, MD   Encounter Date: 01/20/2019  PT End of Session - 01/20/19 0946    Visit Number  9    Number of Visits  12    Date for PT Re-Evaluation  02/17/19    PT Start Time  0943    PT Stop Time  1027    PT Time Calculation (min)  44 min    Activity Tolerance  Patient tolerated treatment well    Behavior During Therapy  [facility] for tasks assessed/performed       Past Medical History:  Diagnosis Date  . Anxiety   . Arthritis   . Asthma   . HTN (hypertension)    "i had a gastric sleeve done and lost 100 pounds and i dont have htn anymore, my doctor, Dr Hilma Favors took me off of it "   . Hypothyroid   . Memory loss   . Seasonal allergies   . Sinus bradycardia   . Urinary tract infection     Past Surgical History:  Procedure Laterality Date  . BLEPHAROPLASTY Bilateral   . CATARACT EXTRACTION, BILATERAL     with lens   . CESAREAN SECTION    . FINGER SURGERY     left pinky  . HERNIA REPAIR  2004   inguinal , incisional, navel   . KNEE SURGERY Left    arthroscopic   . LAPAROSCOPIC GASTRIC SLEEVE RESECTION    . MASS EXCISION Left 10/20/2015   Procedure: EXCISION MASS LEFT SMALL FINGER;  Surgeon: Leanora Cover, MD;  Location: [place];  Service: Orthopedics;  Laterality: Left;  . TONSILLECTOMY AND ADENOIDECTOMY    . TOTAL KNEE ARTHROPLASTY Right 11/24/2018   Procedure: Right Knee Arthroplasty;  Surgeon: Frederik Pear, MD;  Location[facility];  Service: Orthopedics;  Laterality: Right;    There were no vitals filed for this visit.  Subjective Assessment - 01/20/19 0944    Subjective  COVID 19 screening performed on patient upon arrival. Patient reports that her nonsurgical knee is  causing more pain than her surgical knee.    Pertinent History  right TKA 11/24/2018    Limitations  Standing;Walking    Diagnostic tests  x-ray    Patient Stated Goals  decrease pain, improve movement    Currently in Pain?  Yes    Pain Score  2     Pain Location  Knee    Pain Orientation  Left    Pain Descriptors / Indicators  Discomfort    Pain Type  Chronic pain    Pain Onset  More than a month ago    Pain Frequency  Intermittent         [facility] PT Assessment - 01/20/19 0001      Assessment   Medical Diagnosis  right TKA    Referring Provider (PT)  Kathalene Frames. Mayer Camel, MD    Onset Date/Surgical Date  11/24/18    Next MD Visit  02/2019    Prior Therapy  Home health therapy      Precautions   Precautions  None      Restrictions   Weight Bearing Restrictions  No                   [facility]  Adult PT Treatment/Exercise - 01/20/19 0001      Knee/Hip Exercises: Aerobic   Nustep  L5 x10 min      Knee/Hip Exercises: Standing   Lateral Step Up  Right;15 reps;Hand Hold: 2;Step Height: 6"    Forward Step Up  Right;2 sets;10 reps;Hand Hold: 2;Step Height: 6"    Rocker Board  3 minutes      Knee/Hip Exercises: Seated   Long Arc Quad  Strengthening;Right;3 sets;10 reps;Weights    Long Arc Quad Weight  4 lbs.    Clamshell with TheraBand  Red   x30 reps   Hamstring Curl  Strengthening;Right;3 sets;10 reps;Limitations    Hamstring Limitations  red theraband      Modalities   Modalities  Psychologist, educational Location  R knee    Electrical Stimulation Action  IFC    Electrical Stimulation Parameters  80-150 hz x15 min    Electrical Stimulation Goals  Edema      Vasopneumatic   Number Minutes Vasopneumatic   15 minutes    Vasopnuematic Location   Knee    Vasopneumatic Pressure  Low    Vasopneumatic Temperature   34                  PT Long Term Goals - 01/08/19 1014      PT LONG TERM  GOAL #1   Title  Patient will be independent with HEP    Time  6    Period  Weeks    Status  On-going      PT LONG TERM GOAL #2   Title  Patient will demonstrate 120 degrees of right knee flexion AROMto improve stability during functional tasks.    Period  Weeks    Status  On-going   AROM 115 degrees 01/08/19     PT LONG TERM GOAL #3   Title  Patient will demonstrate 4+/5 or greater right knee MMT in all planes to improve stability during functional tasks.    Time  6    Period  Weeks    Status  On-going      PT LONG TERM GOAL #4   Title  Patient will demonstrate reciprocating stair ambulation with one railing to safely enter and exit home.    Time  6    Period  Weeks    Status  Achieved   able to perform with ease 01/08/19           Plan - 01/20/19 1015    Clinical Impression Statement  Patient presented in clinic with no R knee pain currently. Patient weaker with lateral step ups but patient reports also R buttock pain. More reps with end range holds to progress strengthening. Great overall function with R knee but limited by L knee now for pain. Normal modalities response noted following removal of the modalities.    Stability/Clinical Decision Making  Stable/Uncomplicated    Rehab Potential  Good    PT Frequency  2x / week    PT Duration  6 weeks    PT Treatment/Interventions  ADLs/Self Care Home Management;Electrical Stimulation;Moist Heat;Cryotherapy;Gait training;Stair training;Functional mobility training;Therapeutic activities;Therapeutic exercise;Balance training;Passive range of motion;Scar mobilization;Patient/family education;Neuromuscular re-education;Manual techniques;Vasopneumatic Device;Taping    PT Next Visit Plan  Nustep, hip and knee strengthening, end range knee extension strengthening, modalities PRN for pain relief    PT Home Exercise Plan  see patient education section    Consulted and Agree  with Plan of Care  Patient       Patient will benefit from  skilled therapeutic intervention in order to improve the following deficits and impairments:  Difficulty walking, Abnormal gait, Pain, Decreased activity tolerance, Decreased balance, Decreased range of motion, Decreased strength, Increased edema  Visit Diagnosis: Muscle weakness (generalized)  Acute pain of right knee     Problem List Patient Active Problem List   Diagnosis Date Noted  . Total knee replacement status, right 11/24/2018  . Osteoarthritis of right knee 11/20/2018  . Mild cognitive impairment 10/09/2016  . High blood pressure 09/15/2013  . Hypothyroidism 09/15/2013  . Obesity (BMI 30-39.9) 09/15/2013  . S/P arthroscopy of left knee 10/02/2011  . Lateral meniscal tear 01/18/2011    Standley Brooking, PTA 01/20/2019, 10:39 AM  [facility] [address] [place], [place], [address] Phone: [phone]   Fax:  [phone]  Name: Lisa Parker MRN: [id number] Date of Birth: 1946-05-06

## 2019-01-22 ENCOUNTER — Encounter: Payer: Self-pay | Admitting: *Deleted

## 2019-01-22 ENCOUNTER — Other Ambulatory Visit: Payer: Self-pay

## 2019-01-22 ENCOUNTER — Ambulatory Visit: Payer: Medicare Other | Admitting: *Deleted

## 2019-01-22 DIAGNOSIS — M6281 Muscle weakness (generalized): Secondary | ICD-10-CM | POA: Diagnosis not present

## 2019-01-22 DIAGNOSIS — M25561 Pain in right knee: Secondary | ICD-10-CM | POA: Diagnosis not present

## 2019-01-22 NOTE — Therapy (Addendum)
[facility] [place], [place], [address] Phone: [phone]   Fax:  [phone]  Physical Therapy Treatment  Progress Note Reporting Period 12/23/2018 to 01/22/2019  See note below for Objective Data and Assessment of Progress/Goals. Patient is progressing well towards her goals. Gabriela Eves, PT, DPT     Patient Details  Name: Lisa Parker MRN: [id number] Date of Birth: 1946/02/04 Referring Provider (PT): Kathalene Frames. Mayer Camel, MD   Encounter Date: 01/22/2019  PT End of Session - 01/22/19 1012    Visit Number  10    Number of Visits  12    Date for PT Re-Evaluation  02/17/19    PT Start Time  0945    PT Stop Time  [id number]    PT Time Calculation (min)  59 min       Past Medical History:  Diagnosis Date  . Anxiety   . Arthritis   . Asthma   . HTN (hypertension)    "i had a gastric sleeve done and lost 100 pounds and i dont have htn anymore, my doctor, Dr Hilma Favors took me off of it "   . Hypothyroid   . Memory loss   . Seasonal allergies   . Sinus bradycardia   . Urinary tract infection     Past Surgical History:  Procedure Laterality Date  . BLEPHAROPLASTY Bilateral   . CATARACT EXTRACTION, BILATERAL     with lens   . CESAREAN SECTION    . FINGER SURGERY     left pinky  . HERNIA REPAIR  2004   inguinal , incisional, navel   . KNEE SURGERY Left    arthroscopic   . LAPAROSCOPIC GASTRIC SLEEVE RESECTION    . MASS EXCISION Left 10/20/2015   Procedure: EXCISION MASS LEFT SMALL FINGER;  Surgeon: Leanora Cover, MD;  Location: [place];  Service: Orthopedics;  Laterality: Left;  . TONSILLECTOMY AND ADENOIDECTOMY    . TOTAL KNEE ARTHROPLASTY Right 11/24/2018   Procedure: Right Knee Arthroplasty;  Surgeon: Frederik Pear, MD;  [facility];  Service: Orthopedics;  Laterality: Right;    There were no vitals filed for this visit.  Subjective Assessment - 01/22/19 1009    Subjective  COVID 19  screening performed on patient upon arrival. Patient reports that her nonsurgical knee is causing more pain than her surgical knee. To MD 02-03-19    Pertinent History  right TKA 11/24/2018    Limitations  Standing;Walking    Patient Stated Goals  decrease pain, improve movement    Currently in Pain?  Yes    Pain Score  2     Pain Orientation  Left    Pain Descriptors / Indicators  Discomfort    Pain Type  Chronic pain    Pain Onset  More than a month ago                       [facility] Adult PT Treatment/Exercise - 01/22/19 0001      Knee/Hip Exercises: Aerobic   Nustep  L5 x15 min   seat 9      Knee/Hip Exercises: Standing   Lateral Step Up  Right;15 reps;Hand Hold: 2;Step Height: 6"    Forward Step Up  Right;2 sets;10 reps;Hand Hold: 2;Step Height: 6"    Rocker Board  3 minutes      Knee/Hip Exercises: Seated   Long Arc Quad  Strengthening;Right;3 sets;10 reps;Weights    Long Arc  Quad Weight  5 lbs.    Ball Squeeze  x10 hold 5 secs    Clamshell with TheraBand  Red   x30 reps   Hamstring Curl  Strengthening;Right;3 sets;10 reps;Limitations    Hamstring Limitations  red theraband      Modalities   Modalities  Psychologist, educational Location  R knee    Electrical Stimulation Action  IFC    Electrical Stimulation Parameters  80-150hz  x 15 mins    Electrical Stimulation Goals  Edema      Vasopneumatic   Number Minutes Vasopneumatic   15 minutes    Vasopnuematic Location   Knee    Vasopneumatic Pressure  Low    Vasopneumatic Temperature   34                  PT Long Term Goals - 01/22/19 1012      PT LONG TERM GOAL #1   Title  Patient will be independent with HEP    Time  6    Period  Weeks    Status  Achieved      PT LONG TERM GOAL #2   Title  Patient will demonstrate 120 degrees of right knee flexion AROMto improve stability during functional tasks.    Time  6    Period   Weeks    Status  Achieved      PT LONG TERM GOAL #3   Title  Patient will demonstrate 4+/5 or greater right knee MMT in all planes to improve stability during functional tasks.    Time  6    Period  Weeks    Status  On-going      PT LONG TERM GOAL #4   Title  Patient will demonstrate reciprocating stair ambulation with one railing to safely enter and exit home.    Time  6    Period  Weeks    Status  Achieved            Plan - 01/22/19 1037    Clinical Impression Statement  Pt arrived today doing fairly well with RT knee and feels that her LT is holding her back due to pain.She was able to complete all strengthening exs with progression. Her PROM was to 122 degrees of flexion today. LTG for strength is ongoing at this time. Normal modality response.    Stability/Clinical Decision Making  Stable/Uncomplicated    Rehab Potential  Good    PT Frequency  2x / week    PT Treatment/Interventions  ADLs/Self Care Home Management;Electrical Stimulation;Moist Heat;Cryotherapy;Gait training;Stair training;Functional mobility training;Therapeutic activities;Therapeutic exercise;Balance training;Passive range of motion;Scar mobilization;Patient/family education;Neuromuscular re-education;Manual techniques;Vasopneumatic Device;Taping    PT Next Visit Plan  Nustep, hip and knee strengthening, end range knee extension strengthening, modalities PRN for pain relief    PT Home Exercise Plan  see patient education section    Consulted and Agree with Plan of Care  Patient       Patient will benefit from skilled therapeutic intervention in order to improve the following deficits and impairments:  Difficulty walking, Abnormal gait, Pain, Decreased activity tolerance, Decreased balance, Decreased range of motion, Decreased strength, Increased edema  Visit Diagnosis: Muscle weakness (generalized)  Acute pain of right knee     Problem List Patient Active Problem List   Diagnosis Date Noted  .  Total knee replacement status, right 11/24/2018  . Osteoarthritis of right knee 11/20/2018  . Mild cognitive  impairment 10/09/2016  . High blood pressure 09/15/2013  . Hypothyroidism 09/15/2013  . Obesity (BMI 30-39.9) 09/15/2013  . S/P arthroscopy of left knee 10/02/2011  . Lateral meniscal tear 01/18/2011    Maeby Vankleeck,CHRIS, PTA 01/22/2019, 10:46 AM  [facility] [place], [place], [address] Phone: [phone]   Fax:  [phone]  Name: Lisa Parker MRN: [id number] Date of Birth: [birth year]

## 2019-01-27 ENCOUNTER — Ambulatory Visit: Payer: Medicare Other | Admitting: Physical Therapy

## 2019-01-27 ENCOUNTER — Other Ambulatory Visit: Payer: Self-pay

## 2019-01-27 ENCOUNTER — Encounter: Payer: Self-pay | Admitting: Physical Therapy

## 2019-01-27 DIAGNOSIS — M6281 Muscle weakness (generalized): Secondary | ICD-10-CM | POA: Diagnosis not present

## 2019-01-27 DIAGNOSIS — M25561 Pain in right knee: Secondary | ICD-10-CM | POA: Diagnosis not present

## 2019-01-27 NOTE — Therapy (Signed)
[facility] [place], [place], [address] Phone: [phone]   Fax:  [phone]  Physical Therapy Treatment  Patient Details  Name: Lisa Parker MRN: [id number] Date of Birth: 1945/09/08 Referring Provider (PT): Kathalene Frames. Mayer Camel, MD   Encounter Date: 01/27/2019  PT End of Session - 01/27/19 1001    Visit Number  11    Number of Visits  12    Date for PT Re-Evaluation  02/17/19    PT Start Time  0947    PT Stop Time  1041    PT Time Calculation (min)  54 min    Activity Tolerance  Patient tolerated treatment well    Behavior During Therapy  [facility] for tasks assessed/performed       Past Medical History:  Diagnosis Date  . Anxiety   . Arthritis   . Asthma   . HTN (hypertension)    "i had a gastric sleeve done and lost 100 pounds and i dont have htn anymore, my doctor, Dr Hilma Favors took me off of it "   . Hypothyroid   . Memory loss   . Seasonal allergies   . Sinus bradycardia   . Urinary tract infection     Past Surgical History:  Procedure Laterality Date  . BLEPHAROPLASTY Bilateral   . CATARACT EXTRACTION, BILATERAL     with lens   . CESAREAN SECTION    . FINGER SURGERY     left pinky  . HERNIA REPAIR  2004   inguinal , incisional, navel   . KNEE SURGERY Left    arthroscopic   . LAPAROSCOPIC GASTRIC SLEEVE RESECTION    . MASS EXCISION Left 10/20/2015   Procedure: EXCISION MASS LEFT SMALL FINGER;  Surgeon: Leanora Cover, MD;  Location: [place];  Service: Orthopedics;  Laterality: Left;  . TONSILLECTOMY AND ADENOIDECTOMY    . TOTAL KNEE ARTHROPLASTY Right 11/24/2018   Procedure: Right Knee Arthroplasty;  Surgeon: Frederik Pear, MD;  [facility];  Service: Orthopedics;  Laterality: Right;    There were no vitals filed for this visit.  Subjective Assessment - 01/27/19 0956    Subjective  COVID 19 screening performed on patient upon arrival. Patient arrived with continued greater knee pain  in nonsurgical knee.    Pertinent History  right TKA 11/24/2018    Limitations  Standing;Walking    Diagnostic tests  x-ray    Patient Stated Goals  decrease pain, improve movement    Currently in Pain?  No/denies         [facility] PT Assessment - 01/27/19 0001      Assessment   Medical Diagnosis  right TKA    Referring Provider (PT)  Kathalene Frames. Mayer Camel, MD    Onset Date/Surgical Date  11/24/18    Next MD Visit  02/2019    Prior Therapy  Home health therapy      Precautions   Precautions  None      Restrictions   Weight Bearing Restrictions  No                   [facility] Adult PT Treatment/Exercise - 01/27/19 0001      Knee/Hip Exercises: Aerobic   Nustep  L5 x10 min      Knee/Hip Exercises: Machines for Strengthening   Cybex Knee Extension  10# 2x10 reps    Cybex Knee Flexion  30# 3x10 reps    Cybex Leg Press  1 pl, seat 9  x20 reps      Knee/Hip Exercises: Seated   Sit to Sand  20 reps;without UE support      Knee/Hip Exercises: Supine   Straight Leg Raises  AROM;Right;2 sets;10 reps    Other Supine Knee/Hip Exercises  clam shells x30 green theraband      Knee/Hip Exercises: Sidelying   Hip ABduction  AROM;Right;10 reps                  PT Long Term Goals - 01/22/19 1012      PT LONG TERM GOAL #1   Title  Patient will be independent with HEP    Time  6    Period  Weeks    Status  Achieved      PT LONG TERM GOAL #2   Title  Patient will demonstrate 120 degrees of right knee flexion AROMto improve stability during functional tasks.    Time  6    Period  Weeks    Status  Achieved      PT LONG TERM GOAL #3   Title  Patient will demonstrate 4+/5 or greater right knee MMT in all planes to improve stability during functional tasks.    Time  6    Period  Weeks    Status  On-going      PT LONG TERM GOAL #4   Title  Patient will demonstrate reciprocating stair ambulation with one railing to safely enter and exit home.    Time  6    Period   Weeks    Status  Achieved            Plan - 01/27/19 1025    Clinical Impression Statement  Patient presented in clinic with greater complaints of L knee pain than R knee. Patient progressed to machine knee strengthening with limitation in knee extension due to L knee. Patient limited with SL hip abduction and demonstrated weakness. Normal modalities response noted folloiwng removal of the modalities.    Stability/Clinical Decision Making  Stable/Uncomplicated    Rehab Potential  Good    PT Frequency  2x / week    PT Duration  6 weeks    PT Treatment/Interventions  ADLs/Self Care Home Management;Electrical Stimulation;Moist Heat;Cryotherapy;Gait training;Stair training;Functional mobility training;Therapeutic activities;Therapeutic exercise;Balance training;Passive range of motion;Scar mobilization;Patient/family education;Neuromuscular re-education;Manual techniques;Vasopneumatic Device;Taping    PT Next Visit Plan  Nustep, hip and knee strengthening, end range knee extension strengthening, modalities PRN for pain relief    PT Home Exercise Plan  see patient education section    Consulted and Agree with Plan of Care  Patient       Patient will benefit from skilled therapeutic intervention in order to improve the following deficits and impairments:  Difficulty walking, Abnormal gait, Pain, Decreased activity tolerance, Decreased balance, Decreased range of motion, Decreased strength, Increased edema  Visit Diagnosis: Muscle weakness (generalized)  Acute pain of right knee     Problem List Patient Active Problem List   Diagnosis Date Noted  . Total knee replacement status, right 11/24/2018  . Osteoarthritis of right knee 11/20/2018  . Mild cognitive impairment 10/09/2016  . High blood pressure 09/15/2013  . Hypothyroidism 09/15/2013  . Obesity (BMI 30-39.9) 09/15/2013  . S/P arthroscopy of left knee 10/02/2011  . Lateral meniscal tear 01/18/2011    Standley Brooking,  PTA 01/27/2019, 10:48 AM  [facility] [address] [place], [place], [address] Phone: [phone]   Fax:  [phone]  Name: Lisa  Jacinto Reap Parker MRN: [id number] Date of Birth: 1945/08/14

## 2019-01-29 ENCOUNTER — Encounter: Payer: Self-pay | Admitting: Physical Therapy

## 2019-01-29 ENCOUNTER — Other Ambulatory Visit: Payer: Self-pay

## 2019-01-29 ENCOUNTER — Ambulatory Visit: Payer: Medicare Other | Admitting: Physical Therapy

## 2019-01-29 DIAGNOSIS — M25561 Pain in right knee: Secondary | ICD-10-CM

## 2019-01-29 DIAGNOSIS — M6281 Muscle weakness (generalized): Secondary | ICD-10-CM

## 2019-01-29 NOTE — Therapy (Signed)
[facility] [place], [place], [address] Phone: [phone]   Fax:  [phone]  Physical Therapy Treatment PHYSICAL THERAPY DISCHARGE SUMMARY  Visits from Start of Care: 12  Current functional level related to goals / functional outcomes: See below   Remaining deficits: All goals met    Education / Equipment: HEP  Plan: Patient agrees to discharge.  Patient goals were met. Patient is being discharged due to meeting the stated rehab goals.  ?????     Patient Details  Name: Lisa Parker MRN: [id number] Date of Birth: Feb 24, 1946 Referring Provider (PT): Kathalene Frames. Mayer Camel, MD   Encounter Date: 01/29/2019  PT End of Session - 01/29/19 0956    Visit Number  12    Number of Visits  12    Date for PT Re-Evaluation  02/17/19    PT Start Time  0945    PT Stop Time  1036    PT Time Calculation (min)  51 min    Activity Tolerance  Patient tolerated treatment well    Behavior During Therapy  [facility] for tasks assessed/performed       Past Medical History:  Diagnosis Date  . Anxiety   . Arthritis   . Asthma   . HTN (hypertension)    "i had a gastric sleeve done and lost 100 pounds and i dont have htn anymore, my doctor, Dr Hilma Favors took me off of it "   . Hypothyroid   . Memory loss   . Seasonal allergies   . Sinus bradycardia   . Urinary tract infection     Past Surgical History:  Procedure Laterality Date  . BLEPHAROPLASTY Bilateral   . CATARACT EXTRACTION, BILATERAL     with lens   . CESAREAN SECTION    . FINGER SURGERY     left pinky  . HERNIA REPAIR  2004   inguinal , incisional, navel   . KNEE SURGERY Left    arthroscopic   . LAPAROSCOPIC GASTRIC SLEEVE RESECTION    . MASS EXCISION Left 10/20/2015   Procedure: EXCISION MASS LEFT SMALL FINGER;  Surgeon: Leanora Cover, MD;  Location: [place];  Service: Orthopedics;  Laterality: Left;  . TONSILLECTOMY AND ADENOIDECTOMY    . TOTAL KNEE  ARTHROPLASTY Right 11/24/2018   Procedure: Right Knee Arthroplasty;  Surgeon: Frederik Pear, MD;  [facility];  Service: Orthopedics;  Laterality: Right;    There were no vitals filed for this visit.  Subjective Assessment - 01/29/19 0956    Subjective  COVID 19 screening performed on patient upon arrival. Patient arrived with her knee feeling great.    Pertinent History  right TKA 11/24/2018    Limitations  Standing;Walking    Diagnostic tests  x-ray    Patient Stated Goals  decrease pain, improve movement    Currently in Pain?  No/denies         [facility] PT Assessment - 01/29/19 0001      Assessment   Medical Diagnosis  right TKA    Referring Provider (PT)  Kathalene Frames. Mayer Camel, MD    Onset Date/Surgical Date  11/24/18    Next MD Visit  02/2019    Prior Therapy  Home health therapy      Precautions   Precautions  None      Restrictions   Weight Bearing Restrictions  No      Observation/Other Assessments   Focus on Therapeutic Outcomes (FOTO)   17% limitation  AROM   Right Knee Extension  2    Right Knee Flexion  121      PROM   Right Knee Extension  0    Right Knee Flexion  125      Strength   Right Knee Flexion  4+/5    Right Knee Extension  4+/5                   [facility] Adult PT Treatment/Exercise - 01/29/19 0001      Knee/Hip Exercises: Aerobic   Nustep  L5 x10 min      Knee/Hip Exercises: Machines for Strengthening   Cybex Knee Extension  10# 3x10 reps    Cybex Knee Flexion  30# 3x10 reps    Cybex Leg Press  1 pl, seat 9 x20 reps                  PT Long Term Goals - 01/29/19 1004      PT LONG TERM GOAL #1   Title  Patient will be independent with HEP    Time  6    Period  Weeks    Status  Achieved      PT LONG TERM GOAL #2   Title  Patient will demonstrate 120 degrees of right knee flexion AROMto improve stability during functional tasks.    Time  6    Period  Weeks    Status  Achieved      PT LONG TERM GOAL #3    Title  Patient will demonstrate 4+/5 or greater right knee MMT in all planes to improve stability during functional tasks.    Time  6    Period  Weeks    Status  Achieved      PT LONG TERM GOAL #4   Title  Patient will demonstrate reciprocating stair ambulation with one railing to safely enter and exit home.    Time  6    Period  Weeks    Status  Achieved              Patient will benefit from skilled therapeutic intervention in order to improve the following deficits and impairments:     Visit Diagnosis: Muscle weakness (generalized)  Acute pain of right knee     Problem List Patient Active Problem List   Diagnosis Date Noted  . Total knee replacement status, right 11/24/2018  . Osteoarthritis of right knee 11/20/2018  . Mild cognitive impairment 10/09/2016  . High blood pressure 09/15/2013  . Hypothyroidism 09/15/2013  . Obesity (BMI 30-39.9) 09/15/2013  . S/P arthroscopy of left knee 10/02/2011  . Lateral meniscal tear 01/18/2011    Gabriela Eves, PT, DPT 01/29/2019, 10:46 AM  [facility] [place], [place], [address] Phone: [phone]   Fax:  [phone]  Name: Lisa Parker MRN: [id number] Date of Birth: 07-Aug-1945

## 2019-02-03 DIAGNOSIS — M1712 Unilateral primary osteoarthritis, left knee: Secondary | ICD-10-CM | POA: Diagnosis not present

## 2019-02-04 DIAGNOSIS — M1991 Primary osteoarthritis, unspecified site: Secondary | ICD-10-CM | POA: Diagnosis not present

## 2019-02-04 DIAGNOSIS — E7849 Other hyperlipidemia: Secondary | ICD-10-CM | POA: Diagnosis not present

## 2019-02-04 DIAGNOSIS — I10 Essential (primary) hypertension: Secondary | ICD-10-CM | POA: Diagnosis not present

## 2019-03-07 DIAGNOSIS — E7849 Other hyperlipidemia: Secondary | ICD-10-CM | POA: Diagnosis not present

## 2019-03-07 DIAGNOSIS — I10 Essential (primary) hypertension: Secondary | ICD-10-CM | POA: Diagnosis not present

## 2019-03-07 DIAGNOSIS — M1991 Primary osteoarthritis, unspecified site: Secondary | ICD-10-CM | POA: Diagnosis not present

## 2019-05-05 ENCOUNTER — Other Ambulatory Visit: Payer: Self-pay | Admitting: Orthopedic Surgery

## 2019-05-07 DIAGNOSIS — E039 Hypothyroidism, unspecified: Secondary | ICD-10-CM | POA: Diagnosis not present

## 2019-05-22 ENCOUNTER — Encounter (HOSPITAL_COMMUNITY): Payer: Self-pay

## 2019-05-22 ENCOUNTER — Other Ambulatory Visit: Payer: Self-pay

## 2019-05-22 ENCOUNTER — Encounter (HOSPITAL_COMMUNITY)
Admission: RE | Admit: 2019-05-22 | Discharge: 2019-05-22 | Disposition: A | Discharge status: Home / Self Care | Payer: Medicare Other | Source: Ambulatory Visit | Attending: Orthopedic Surgery | Admitting: Orthopedic Surgery

## 2019-05-22 DIAGNOSIS — Z01812 Encounter for preprocedural laboratory examination: Secondary | ICD-10-CM | POA: Diagnosis not present

## 2019-05-22 NOTE — Progress Notes (Addendum)
PCP - Estil Daft. LOV: 11/2018 Cardiologist -   Chest x-ray - 11/21/18. EPIC EKG - 11/21/18. EPIC Stress Test -  ECHO -  Cardiac Cath -   Sleep Study -  CPAP -   Fasting Blood Sugar -  Checks Blood Sugar _____ times a day  Blood Thinner Instructions: Aspirin Instructions:Pt. Is not taking aspirin,she stopped last year. Last Dose:  Anesthesia review:   Patient denies shortness of breath, fever, cough and chest pain at PAT appointment   Patient verbalized understanding of instructions that were given to them at the PAT appointment. Patient was also instructed that they will need to review over the PAT instructions again at home before surgery.

## 2019-05-22 NOTE — Patient Instructions (Addendum)
DUE TO COVID-19 ONLY ONE VISITOR IS ALLOWED TO COME WITH YOU AND STAY IN THE WAITING ROOM ONLY DURING PRE OP AND PROCEDURE DAY OF SURGERY. THE 1 VISITOR MAY VISIT WITH YOU AFTER SURGERY IN YOUR PRIVATE ROOM DURING VISITING HOURS ONLY!  YOU NEED TO HAVE A COVID 19 TEST ON:05/23/19 @ 9:00 am, THIS TEST MUST BE DONE BEFORE SURGERY, COME  [address], [place] , [address].  ([place]) ONCE YOUR COVID TEST IS COMPLETED, PLEASE BEGIN THE QUARANTINE INSTRUCTIONS AS OUTLINED IN YOUR HANDOUT.                Erie Noe Perkin     Your procedure is scheduled on: 05/27/2019   Report to [facility] Main  Entrance   Report to admitting at: 8:00 AM     Call this number if you have problems the morning of surgery [phone]    Remember:     [place], NO [place].     Take these medicines the morning of surgery with A SIP OF WATER: Bupropion,escitalopram,levothyroxine,oxybutinin,memantine.Use your inhalers and tylenol as needed.                                 You may not have any metal on your body including hair pins and              piercings  Do not wear jewelry, make-up, lotions, powders or perfumes, deodorant             Do not wear nail polish on your fingernails.  Do not shave  48 hours prior to surgery.                 Do not bring valuables to the hospital. [place].  Contacts, dentures or bridgework may not be worn into surgery.  Leave suitcase in the car. After surgery it may be brought to your room.     Patients discharged the day of surgery will not be allowed to drive home. IF YOU ARE HAVING SURGERY AND GOING HOME THE SAME DAY, YOU MUST HAVE AN ADULT TO DRIVE YOU HOME AND  BE WITH YOU FOR 24 HOURS. YOU MAY GO HOME BY TAXI OR UBER OR ORTHERWISE, BUT AN ADULT MUST ACCOMPANY YOU HOME AND STAY WITH YOU FOR 24 HOURS.  Name and phone  number of your driver:  Special Instructions: N/A              Please read over the following fact sheets you were given: _____________________________________________________________________             NO SOLID FOOD AFTER MIDNIGHT THE NIGHT PRIOR TO SURGERY. NOTHING BY MOUTH EXCEPT CLEAR LIQUIDS UNTIL: 7:30 am . PLEASE FINISH ENSURE DRINK PER SURGEON ORDER  WHICH NEEDS TO BE COMPLETED AT: 7:30 am .   CLEAR LIQUID DIET   Foods Allowed  Foods Excluded  Coffee and tea, regular and decaf                             liquids that you cannot  Plain Jell-O any favor except red or purple                                           see through such as: Fruit ices (not with fruit pulp)                                     milk, soups, orange juice  Iced Popsicles                                    All solid food Carbonated beverages, regular and diet                                    Cranberry, grape and apple juices Sports drinks like Gatorade Lightly seasoned clear broth or consume(fat free) Sugar, honey syrup  Sample Menu Breakfast                                Lunch                                     Supper Cranberry juice                    Beef broth                            Chicken broth Jell-O                                     Grape juice                           Apple juice Coffee or tea                        Jell-O                                      Popsicle                                                Coffee or tea                        Coffee or tea  _____________________________________________________________________     [facility] - Preparing for Surgery Before surgery, you can play an important role.  Because skin is not  sterile, your skin needs to be as free of germs as possible.  You can reduce the number of germs on your skin by washing with CHG (chlorahexidine gluconate) soap before  surgery.  CHG is an antiseptic cleaner which kills germs and bonds with the skin to continue killing germs even after washing. Please DO NOT use if you have an allergy to CHG or antibacterial soaps.  If your skin becomes reddened/irritated stop using the CHG and inform your nurse when you arrive at Short Stay. Do not shave (including legs and underarms) for at least 48 hours prior to the first CHG shower.  You may shave your face/neck. Please follow these instructions carefully:  1.  Shower with CHG Soap the night before surgery and the  morning of Surgery.  2.  If you choose to wash your hair, wash your hair first as usual with your  normal  shampoo.  3.  After you shampoo, rinse your hair and body thoroughly to remove the  shampoo.                           4.  Use CHG as you would any other liquid soap.  You can apply chg directly  to the skin and wash                       Gently with a scrungie or clean washcloth.  5.  Apply the CHG Soap to your body ONLY FROM THE NECK DOWN.   Do not use on face/ open                           Wound or open sores. Avoid contact with eyes, ears mouth and genitals (private parts).                       Wash face,  Genitals (private parts) with your normal soap.             6.  Wash thoroughly, paying special attention to the area where your surgery  will be performed.  7.  Thoroughly rinse your body with warm water from the neck down.  8.  DO NOT shower/wash with your normal soap after using and rinsing off  the CHG Soap.                9.  Pat yourself dry with a clean towel.            10.  Wear clean pajamas.            11.  Place clean sheets on your bed the night of your first shower and do not  sleep with pets. Day of Surgery : Do not apply any lotions/deodorants the morning of surgery.  Please wear clean clothes to the hospital/surgery center.  FAILURE TO FOLLOW THESE INSTRUCTIONS MAY RESULT IN THE CANCELLATION OF YOUR SURGERY PATIENT  SIGNATURE_________________________________  NURSE SIGNATURE__________________________________  ________________________________________________________________________   Adam Phenix  An incentive spirometer is a tool that can help keep your lungs clear and active. This tool measures how well you are filling your lungs with each breath. Taking long deep breaths may help reverse or decrease the chance of developing breathing (pulmonary) problems (especially infection) following:  A long period of time when you are unable to move or be active. BEFORE THE PROCEDURE   If the spirometer includes  an indicator to show your best effort, your nurse or respiratory therapist will set it to a desired goal.  If possible, sit up straight or lean slightly forward. Try not to slouch.  Hold the incentive spirometer in an upright position. INSTRUCTIONS FOR USE  1. Sit on the edge of your bed if possible, or sit up as far as you can in bed or on a chair. 2. Hold the incentive spirometer in an upright position. 3. Breathe out normally. 4. Place the mouthpiece in your mouth and seal your lips tightly around it. 5. Breathe in slowly and as deeply as possible, raising the piston or the ball toward the top of the column. 6. Hold your breath for 3-5 seconds or for as long as possible. Allow the piston or ball to fall to the bottom of the column. 7. Remove the mouthpiece from your mouth and breathe out normally. 8. Rest for a few seconds and repeat Steps 1 through 7 at least 10 times every 1-2 hours when you are awake. Take your time and take a few normal breaths between deep breaths. 9. The spirometer may include an indicator to show your best effort. Use the indicator as a goal to work toward during each repetition. 10. After each set of 10 deep breaths, practice coughing to be sure your lungs are clear. If you have an incision (the cut made at the time of surgery), support your incision when coughing  by placing a pillow or rolled up towels firmly against it. Once you are able to get out of bed, walk around indoors and cough well. You may stop using the incentive spirometer when instructed by your caregiver.  RISKS AND COMPLICATIONS  Take your time so you do not get dizzy or light-headed.  If you are in pain, you may need to take or ask for pain medication before doing incentive spirometry. It is harder to take a deep breath if you are having pain. AFTER USE  Rest and breathe slowly and easily.  It can be helpful to keep track of a log of your progress. Your caregiver can provide you with a simple table to help with this. If you are using the spirometer at home, follow these instructions: [place] IF:   You are having difficultly using the spirometer.  You have trouble using the spirometer as often as instructed.  Your pain medication is not giving enough relief while using the spirometer.  You develop fever of 100.5 F (38.1 C) or higher. SEEK IMMEDIATE MEDICAL CARE IF:   You cough up bloody sputum that had not been present before.  You develop fever of 102 F (38.9 C) or greater.  You develop worsening pain at or near the incision site. MAKE SURE YOU:   Understand these instructions.  Will watch your condition.  Will get help right away if you are not doing well or get worse. Document Released: 09/03/2006 Document Revised: 07/16/2011 Document Reviewed: 11/04/2006 [facility] Patient Information 2014 [place], [place].   ________________________________________________________________________

## 2019-05-23 ENCOUNTER — Other Ambulatory Visit (HOSPITAL_COMMUNITY)
Admission: RE | Admit: 2019-05-23 | Discharge: 2019-05-23 | Disposition: A | Discharge status: Home / Self Care | Payer: Medicare Other | Source: Ambulatory Visit | Attending: Orthopedic Surgery | Admitting: Orthopedic Surgery

## 2019-05-23 DIAGNOSIS — Z01812 Encounter for preprocedural laboratory examination: Secondary | ICD-10-CM | POA: Diagnosis not present

## 2019-05-23 DIAGNOSIS — Z20822 Contact with and (suspected) exposure to covid-19: Secondary | ICD-10-CM | POA: Diagnosis not present

## 2019-05-24 LAB — NOVEL CORONAVIRUS, NAA (HOSP ORDER, SEND-OUT TO REF LAB; TAT 18-24 HRS): SARS-CoV-2, NAA: NOT DETECTED

## 2019-05-24 NOTE — Care Plan (Signed)
Ortho Bundle Case Management Note  Patient Details  Name: Lisa Parker MRN: [id number] Date of Birth: Dec 08, 1945   patient plans to discharge to home with family. HHPT referral to [facility]. OPPT set up at [facility]. Rolling walker ordered from [place]. Patient and MD in agreement with plan. Choice offered.                   DME Arranged:    DME Agency:     HH Arranged:  PT HH Agency:  [facility] (formerly [facility])  Additional Comments: Please contact me with any questions of if this plan should need to change.  Ladell Heads,  [place] Specialist  [phone] 05/24/2019, 10:15 PM

## 2019-05-25 ENCOUNTER — Other Ambulatory Visit: Payer: Self-pay

## 2019-05-25 ENCOUNTER — Encounter (HOSPITAL_COMMUNITY)
Admission: RE | Admit: 2019-05-25 | Discharge: 2019-05-25 | Disposition: A | Discharge status: Home / Self Care | Payer: Medicare Other | Source: Ambulatory Visit | Attending: Orthopedic Surgery | Admitting: Orthopedic Surgery

## 2019-05-25 DIAGNOSIS — Z01812 Encounter for preprocedural laboratory examination: Secondary | ICD-10-CM | POA: Diagnosis not present

## 2019-05-25 LAB — CBC WITH DIFFERENTIAL/PLATELET
Abs Immature Granulocytes: 0.02 10*3/uL (ref 0.00–0.07)
Basophils Absolute: 0 10*3/uL (ref 0.0–0.1)
Basophils Relative: 1 %
Eosinophils Absolute: 0.1 10*3/uL (ref 0.0–0.5)
Eosinophils Relative: 3 %
HCT: 44.6 % (ref 36.0–46.0)
Hemoglobin: 14.5 g/dL (ref 12.0–15.0)
Immature Granulocytes: 1 %
Lymphocytes Relative: 28 %
Lymphs Abs: 1.2 10*3/uL (ref 0.7–4.0)
MCH: 30 pg (ref 26.0–34.0)
MCHC: 32.5 g/dL (ref 30.0–36.0)
MCV: 92.3 fL (ref 80.0–100.0)
Monocytes Absolute: 0.3 10*3/uL (ref 0.1–1.0)
Monocytes Relative: 7 %
Neutro Abs: 2.6 10*3/uL (ref 1.7–7.7)
Neutrophils Relative %: 60 %
Platelets: 206 10*3/uL (ref 150–400)
RBC: 4.83 MIL/uL (ref 3.87–5.11)
RDW: 13.5 % (ref 11.5–15.5)
WBC: 4.2 10*3/uL (ref 4.0–10.5)
nRBC: 0 % (ref 0.0–0.2)

## 2019-05-25 LAB — SURGICAL PCR SCREEN
MRSA, PCR: NEGATIVE
Staphylococcus aureus: NEGATIVE

## 2019-05-25 LAB — BASIC METABOLIC PANEL WITH GFR
Anion gap: 8 (ref 5–15)
BUN: 16 mg/dL (ref 8–23)
CO2: 28 mmol/L (ref 22–32)
Calcium: 9.3 mg/dL (ref 8.9–10.3)
Chloride: 104 mmol/L (ref 98–111)
Creatinine, Ser: 0.75 mg/dL (ref 0.44–1.00)
GFR calc Af Amer: >60 mL/min
GFR calc non Af Amer: >60 mL/min
Glucose, Bld: 88 mg/dL (ref 70–99)
Potassium: 4.4 mmol/L (ref 3.5–5.1)
Sodium: 140 mmol/L (ref 135–145)

## 2019-05-25 LAB — URINALYSIS, ROUTINE W REFLEX MICROSCOPIC
Bacteria, UA: NONE SEEN
Bilirubin Urine: NEGATIVE
Glucose, UA: NEGATIVE mg/dL
Ketones, ur: NEGATIVE mg/dL
Leukocytes,Ua: NEGATIVE
Nitrite: NEGATIVE
Protein, ur: NEGATIVE mg/dL
Specific Gravity, Urine: 1.005 (ref 1.005–1.030)
pH: 6 (ref 5.0–8.0)

## 2019-05-25 LAB — PROTIME-INR
INR: 1 (ref 0.8–1.2)
Prothrombin Time: 12.8 s (ref 11.4–15.2)

## 2019-05-25 LAB — APTT: aPTT: 27 s (ref 24–36)

## 2019-05-26 DIAGNOSIS — Z0001 Encounter for general adult medical examination with abnormal findings: Secondary | ICD-10-CM | POA: Diagnosis not present

## 2019-05-26 DIAGNOSIS — Z1389 Encounter for screening for other disorder: Secondary | ICD-10-CM | POA: Diagnosis not present

## 2019-05-26 DIAGNOSIS — E039 Hypothyroidism, unspecified: Secondary | ICD-10-CM | POA: Diagnosis not present

## 2019-05-26 DIAGNOSIS — M1712 Unilateral primary osteoarthritis, left knee: Secondary | ICD-10-CM | POA: Diagnosis present

## 2019-05-26 DIAGNOSIS — I10 Essential (primary) hypertension: Secondary | ICD-10-CM | POA: Diagnosis not present

## 2019-05-26 DIAGNOSIS — E785 Hyperlipidemia, unspecified: Secondary | ICD-10-CM | POA: Diagnosis not present

## 2019-05-26 DIAGNOSIS — M2550 Pain in unspecified joint: Secondary | ICD-10-CM | POA: Diagnosis not present

## 2019-05-26 MED ORDER — BUPIVACAINE LIPOSOME 1.3 % IJ SUSP
20.0000 mL | INTRAMUSCULAR | Status: DC
  Filled 2019-05-26: qty 20

## 2019-05-26 MED ORDER — TRANEXAMIC ACID 1000 MG/10ML IV SOLN
2000.0000 mg | INTRAVENOUS | Status: DC
  Filled 2019-05-26: qty 20

## 2019-05-26 NOTE — H&P (Signed)
TOTAL KNEE ADMISSION H&P  Patient is being admitted for left total knee arthroplasty.  Subjective:  Chief Complaint:left knee pain.  HPI: Lisa Parker, 74 y.o. female, has a history of pain and functional disability in the left knee due to arthritis and has failed non-surgical conservative treatments for greater than 12 weeks to includeNSAID's and/or analgesics, corticosteriod injections, flexibility and strengthening excercises, use of assistive devices, weight reduction as appropriate and activity modification.  Onset of symptoms was gradual, starting several years ago with gradually worsening course since that time. The patient noted no past surgery on the left knee(s).  Patient currently rates pain in the left knee(s) at 10 out of 10 with activity. Patient has night pain, worsening of pain with activity and weight bearing, pain that interferes with activities of daily living, pain with passive range of motion, crepitus and joint swelling.  Patient has evidence of joint space narrowing by imaging studies.   There is no active infection.  Patient Active Problem List   Diagnosis Date Noted  . Degenerative arthritis of left knee 05/26/2019  . Total knee replacement status, right 11/24/2018  . Osteoarthritis of right knee 11/20/2018  . Mild cognitive impairment 10/09/2016  . High blood pressure 09/15/2013  . Hypothyroidism 09/15/2013  . Obesity (BMI 30-39.9) 09/15/2013  . S/P arthroscopy of left knee 10/02/2011  . Lateral meniscal tear 01/18/2011   Past Medical History:  Diagnosis Date  . Anxiety   . Arthritis   . Asthma   . HTN (hypertension)    "i had a gastric sleeve done and lost 100 pounds and i dont have htn anymore, my doctor, Dr Hilma Favors took me off of it "   . Hypothyroid   . Memory loss   . Seasonal allergies   . Sinus bradycardia   . Urinary tract infection     Past Surgical History:  Procedure Laterality Date  . BLEPHAROPLASTY Bilateral   . CATARACT EXTRACTION,  BILATERAL     with lens   . CESAREAN SECTION    . FINGER SURGERY     left pinky  . HERNIA REPAIR  2004   inguinal , incisional, navel   . KNEE SURGERY Left    arthroscopic   . LAPAROSCOPIC GASTRIC SLEEVE RESECTION    . MASS EXCISION Left 10/20/2015   Procedure: EXCISION MASS LEFT SMALL FINGER;  Surgeon: Leanora Cover, MD;  Location: [place];  Service: Orthopedics;  Laterality: Left;  . TONSILLECTOMY AND ADENOIDECTOMY    . TOTAL KNEE ARTHROPLASTY Right 11/24/2018   Procedure: Right Knee Arthroplasty;  Surgeon: Frederik Pear, MD;  Location: [facility];  Service: Orthopedics;  Laterality: Right;    Current Facility-Administered Medications  Medication Dose Route Frequency Provider Last Rate Last Admin  . [START ON 05/27/2019] bupivacaine liposome (EXPAREL) 1.3 % injection 266 mg  20 mL Other On Call to OR Frederik Pear, MD      . Derrill Memo ON 05/27/2019] tranexamic acid (CYKLOKAPRON) 2,000 mg in sodium chloride 0.9 % 50 mL Topical Application  123XX123 mg Topical On Call to OR Frederik Pear, MD       Current Outpatient Medications  Medication Sig Dispense Refill Last Dose  . acetaminophen (TYLENOL) 500 MG tablet Take 1,500 mg by mouth 2 (two) times daily as needed for moderate pain.     Marland Kitchen ALPRAZolam (XANAX) 0.5 MG tablet Take 0.5 mg by mouth at bedtime.     Marland Kitchen ascorbic acid (VITAMIN C) 500 MG tablet Take 500 mg by  mouth daily.     . budesonide-formoterol (SYMBICORT) 160-4.5 MCG/ACT inhaler Inhale 2 puffs into the lungs 2 (two) times daily as needed (shortness of breath).     Marland Kitchen buPROPion (WELLBUTRIN XL) 150 MG 24 hr tablet Take 150 mg by mouth daily.     . Calcium Carb-Cholecalciferol (CALCIUM 600 + D PO) Take 1 tablet by mouth daily.     . Cholecalciferol (VITAMIN D) 2000 UNITS tablet Take 2,000 Units by mouth daily.     . Cholecalciferol (VITAMIN D) 50 MCG (2000 UT) tablet Take 2,000 Units by mouth daily.     . diclofenac Sodium (VOLTAREN) 1 % GEL Apply 1 application topically 4 (four)  times daily as needed (pain).     Marland Kitchen escitalopram (LEXAPRO) 20 MG tablet Take 20 mg by mouth daily.      . furosemide (LASIX) 20 MG tablet Take 20 mg by mouth daily.     Marland Kitchen levothyroxine (SYNTHROID) 150 MCG tablet Take 150 mcg by mouth daily before breakfast.     . memantine (NAMENDA) 10 MG tablet TAKE  (1) TABLET TWICE A DAY. (Patient taking differently: Take 10 mg by mouth 2 (two) times daily. ) 180 tablet 3   . Multiple Vitamin (MULTIVITAMIN WITH MINERALS) TABS tablet Take 1 tablet by mouth daily.     . Omega-3 Fatty Acids (OMEGA 3 500) 500 MG CAPS Take 500 mg by mouth daily.     Marland Kitchen oxybutynin (DITROPAN XL) 15 MG 24 hr tablet Take 15 mg by mouth daily.     . SUPER B COMPLEX/C PO Take 2 tablets by mouth daily.     Marland Kitchen aspirin EC 81 MG tablet Take 1 tablet (81 mg total) by mouth 2 (two) times daily. (Patient not taking: Reported on 12/23/2018) 60 tablet 0 Not Taking at Unknown time  . donepezil (ARICEPT) 10 MG tablet Take 1 tablet (10 mg total) by mouth at bedtime. (Patient not taking: Reported on 11/21/2018) 90 tablet 4 Not Taking at Unknown time   Allergies  Allergen Reactions  . Mold Extract  [Trichophyton]     Other reaction(s): Wheezing (ALLERGY/intolerance)  . Pollen Extract     Other reaction(s): Wheezing (ALLERGY/intolerance)  . Cucumber Extract Swelling  . Other     Melon and Walnuts - swelling    Social History   Tobacco Use  . Smoking status: Former Smoker    Packs/day: 0.00    Years: 0.00    Pack years: 0.00    Types: Cigarettes    Start date: 50  . Smokeless tobacco: Never Used  Substance Use Topics  . Alcohol use: No    Family History  Problem Relation Age of Onset  . Alzheimer's disease Mother   . Lung cancer Father   . Heart disease Other   . Asthma Other   . Alzheimer's disease Maternal Aunt      Review of Systems  Constitutional: Negative.   HENT: Negative.   Eyes: Negative.   Respiratory: Negative.   Cardiovascular: Positive for leg swelling.   Endocrine: Negative.   Genitourinary: Positive for frequency.  Musculoskeletal: Positive for arthralgias and myalgias.  Skin: Negative.   Allergic/Immunologic: Negative.   Neurological: Negative.   Hematological: Bruises/bleeds easily.  Psychiatric/Behavioral: The patient is nervous/anxious.     Objective:  Physical Exam  Constitutional: She is oriented to person, place, and time. She appears well-developed and well-nourished.  HENT:  Head: Normocephalic and atraumatic.  Eyes: Pupils are equal, round, and reactive to light.  Cardiovascular: Intact distal pulses.  Respiratory: Effort normal.  Musculoskeletal:        General: Tenderness and deformity present.     Cervical back: Normal range of motion and neck supple.     Comments: left knee has impressive valgus deformity of about 15 tender along the lateral greater medial joint line medial collateral ligament is stable range of motion is from 5-120 with pain.    Neurological: She is alert and oriented to person, place, and time.  Skin: Skin is warm and dry.  Psychiatric: She has a normal mood and affect. Her behavior is normal. Judgment and thought content normal.    Vital signs in last 24 hours: Temp:  [98.7 F (37.1 C)] 98.7 F (37.1 C) (01/18 1407) Pulse Rate:  [58] 58 (01/18 1407) Resp:  [16] 16 (01/18 1407) BP: (180)/(68) 180/68 (01/18 1407) SpO2:  [100 %] 100 % (01/18 1407) Weight:  [79.1 kg] 79.1 kg (01/18 1407)  Labs:   Estimated body mass index is 28.15 kg/m as calculated from the following:   Height as of 05/25/19: 5\' 6"  (1.676 m).   Weight as of 05/25/19: 79.1 kg.   Imaging Review Plain radiographs demonstrate end-stage arthritis with valgus deformity.   Assessment/Plan:  End stage arthritis, left knee   The patient history, physical examination, clinical judgment of the provider and imaging studies are consistent with end stage degenerative joint disease of the left knee(s) and total knee  arthroplasty is deemed medically necessary. The treatment options including medical management, injection therapy arthroscopy and arthroplasty were discussed at length. The risks and benefits of total knee arthroplasty were presented and reviewed. The risks due to aseptic loosening, infection, stiffness, patella tracking problems, thromboembolic complications and other imponderables were discussed. The patient acknowledged the explanation, agreed to proceed with the plan and consent was signed. Patient is being admitted for inpatient treatment for surgery, pain control, PT, OT, prophylactic antibiotics, VTE prophylaxis, progressive ambulation and ADL's and discharge planning. The patient is planning to be discharged home with home health services     Patient's anticipated LOS is less than 2 midnights, meeting these requirements: - Younger than 23 - Lives within 1 hour of care - Has a competent adult at home to recover with post-op recover - NO history of  - Chronic pain requiring opiods  - Diabetes  - Coronary Artery Disease  - Heart failure  - Heart attack  - Stroke  - DVT/VTE  - Cardiac arrhythmia  - Respiratory Failure/COPD  - Renal failure  - Anemia  - Advanced Liver disease

## 2019-05-27 ENCOUNTER — Ambulatory Visit (HOSPITAL_COMMUNITY): Payer: Medicare Other | Admitting: Physician Assistant

## 2019-05-27 ENCOUNTER — Encounter (HOSPITAL_COMMUNITY): Payer: Self-pay | Admitting: Orthopedic Surgery

## 2019-05-27 ENCOUNTER — Ambulatory Visit (HOSPITAL_COMMUNITY)
Admission: RE | Admit: 2019-05-27 | Discharge: 2019-05-27 | Disposition: A | Discharge status: Home / Self Care | Payer: Medicare Other | Attending: Orthopedic Surgery | Admitting: Orthopedic Surgery

## 2019-05-27 ENCOUNTER — Encounter (HOSPITAL_COMMUNITY)
Admission: RE | Disposition: A | Discharge status: Home / Self Care | Payer: Self-pay | Source: Home / Self Care | Attending: Orthopedic Surgery

## 2019-05-27 DIAGNOSIS — Z96651 Presence of right artificial knee joint: Secondary | ICD-10-CM | POA: Diagnosis not present

## 2019-05-27 DIAGNOSIS — Z87891 Personal history of nicotine dependence: Secondary | ICD-10-CM | POA: Insufficient documentation

## 2019-05-27 DIAGNOSIS — G8918 Other acute postprocedural pain: Secondary | ICD-10-CM | POA: Diagnosis not present

## 2019-05-27 DIAGNOSIS — M21062 Valgus deformity, not elsewhere classified, left knee: Secondary | ICD-10-CM | POA: Diagnosis not present

## 2019-05-27 DIAGNOSIS — Z7982 Long term (current) use of aspirin: Secondary | ICD-10-CM | POA: Insufficient documentation

## 2019-05-27 DIAGNOSIS — Z79899 Other long term (current) drug therapy: Secondary | ICD-10-CM | POA: Insufficient documentation

## 2019-05-27 DIAGNOSIS — M1712 Unilateral primary osteoarthritis, left knee: Secondary | ICD-10-CM | POA: Diagnosis not present

## 2019-05-27 DIAGNOSIS — E039 Hypothyroidism, unspecified: Secondary | ICD-10-CM | POA: Diagnosis not present

## 2019-05-27 DIAGNOSIS — F419 Anxiety disorder, unspecified: Secondary | ICD-10-CM | POA: Insufficient documentation

## 2019-05-27 DIAGNOSIS — Z9884 Bariatric surgery status: Secondary | ICD-10-CM | POA: Insufficient documentation

## 2019-05-27 DIAGNOSIS — Z79890 Hormone replacement therapy (postmenopausal): Secondary | ICD-10-CM | POA: Insufficient documentation

## 2019-05-27 DIAGNOSIS — G3184 Mild cognitive impairment, so stated: Secondary | ICD-10-CM | POA: Diagnosis not present

## 2019-05-27 HISTORY — PX: TOTAL KNEE ARTHROPLASTY: SHX125

## 2019-05-27 LAB — TYPE AND SCREEN
ABO/RH(D): A POS
Antibody Screen: NEGATIVE

## 2019-05-27 SURGERY — ARTHROPLASTY, KNEE, TOTAL
Anesthesia: Spinal | Site: Knee | Laterality: Left

## 2019-05-27 MED ORDER — ONDANSETRON HCL 4 MG/2ML IJ SOLN: 4.0000 mg | Freq: Four times a day (QID) | INTRAMUSCULAR | Status: DC | PRN

## 2019-05-27 MED ORDER — HYDROMORPHONE HCL 2 MG PO TABS: 2.0000 mg | ORAL_TABLET | ORAL | 0 refills | Status: DC | PRN

## 2019-05-27 MED ORDER — MEPERIDINE HCL 50 MG/ML IJ SOLN: 6.2500 mg | INTRAMUSCULAR | Status: DC | PRN

## 2019-05-27 MED ORDER — PROPOFOL 500 MG/50ML IV EMUL
INTRAVENOUS | Status: DC | PRN
  Administered 2019-05-27: 75 ug/kg/min via INTRAVENOUS

## 2019-05-27 MED ORDER — OXYBUTYNIN CHLORIDE ER 15 MG PO TB24: 15.0000 mg | ORAL_TABLET | Freq: Every day | ORAL | Status: DC

## 2019-05-27 MED ORDER — METHOCARBAMOL 500 MG IVPB - SIMPLE MED
500.0000 mg | Freq: Four times a day (QID) | INTRAVENOUS | Status: DC | PRN
  Administered 2019-05-27: 500 mg via INTRAVENOUS

## 2019-05-27 MED ORDER — ALUM & MAG HYDROXIDE-SIMETH 200-200-20 MG/5ML PO SUSP: 30.0000 mL | ORAL | Status: DC | PRN

## 2019-05-27 MED ORDER — ACETAMINOPHEN 325 MG PO TABS: 325.0000 mg | ORAL_TABLET | Freq: Four times a day (QID) | ORAL | Status: DC | PRN

## 2019-05-27 MED ORDER — LACTATED RINGERS IV BOLUS
250.0000 mL | Freq: Once | INTRAVENOUS | Status: AC
  Administered 2019-05-27: 250 mL via INTRAVENOUS

## 2019-05-27 MED ORDER — METHOCARBAMOL 500 MG IVPB - SIMPLE MED
INTRAVENOUS | Status: AC
  Filled 2019-05-27: qty 50

## 2019-05-27 MED ORDER — BUPIVACAINE HCL (PF) 0.25 % IJ SOLN
INTRAMUSCULAR | Status: AC
  Filled 2019-05-27: qty 30

## 2019-05-27 MED ORDER — DEXAMETHASONE SODIUM PHOSPHATE 10 MG/ML IJ SOLN
INTRAMUSCULAR | Status: AC
  Filled 2019-05-27: qty 1

## 2019-05-27 MED ORDER — METHOCARBAMOL 500 MG PO TABS: 500.0000 mg | ORAL_TABLET | Freq: Four times a day (QID) | ORAL | Status: DC | PRN

## 2019-05-27 MED ORDER — PANTOPRAZOLE SODIUM 40 MG PO TBEC: 40.0000 mg | DELAYED_RELEASE_TABLET | Freq: Every day | ORAL | Status: DC

## 2019-05-27 MED ORDER — PHENYLEPHRINE HCL (PRESSORS) 10 MG/ML IV SOLN
INTRAVENOUS | Status: AC
  Filled 2019-05-27: qty 1

## 2019-05-27 MED ORDER — CHLORHEXIDINE GLUCONATE 4 % EX LIQD: 60.0000 mL | Freq: Once | CUTANEOUS | Status: DC

## 2019-05-27 MED ORDER — FLEET ENEMA 7-19 GM/118ML RE ENEM: 1.0000 | ENEMA | Freq: Once | RECTAL | Status: DC | PRN

## 2019-05-27 MED ORDER — MIDAZOLAM HCL 2 MG/2ML IJ SOLN
INTRAMUSCULAR | Status: AC
  Filled 2019-05-27: qty 2

## 2019-05-27 MED ORDER — EPHEDRINE SULFATE-NACL 50-0.9 MG/10ML-% IV SOSY
PREFILLED_SYRINGE | INTRAVENOUS | Status: DC | PRN
  Administered 2019-05-27: 5 mg via INTRAVENOUS

## 2019-05-27 MED ORDER — WATER FOR IRRIGATION, STERILE IR SOLN
Status: DC | PRN
  Administered 2019-05-27: 2000 mL

## 2019-05-27 MED ORDER — SODIUM CHLORIDE 0.9 % IR SOLN
Status: DC | PRN
  Administered 2019-05-27: 1000 mL

## 2019-05-27 MED ORDER — ACETAMINOPHEN 160 MG/5ML PO SOLN: 325.0000 mg | ORAL | Status: DC | PRN

## 2019-05-27 MED ORDER — ACETAMINOPHEN 325 MG PO TABS: 325.0000 mg | ORAL_TABLET | ORAL | Status: DC | PRN

## 2019-05-27 MED ORDER — KCL IN DEXTROSE-NACL 20-5-0.45 MEQ/L-%-% IV SOLN: INTRAVENOUS | Status: DC

## 2019-05-27 MED ORDER — ALPRAZOLAM 0.5 MG PO TABS: 0.5000 mg | ORAL_TABLET | Freq: Every day | ORAL | Status: DC

## 2019-05-27 MED ORDER — ESCITALOPRAM OXALATE 20 MG PO TABS: 20.0000 mg | ORAL_TABLET | Freq: Every day | ORAL | Status: DC

## 2019-05-27 MED ORDER — TRANEXAMIC ACID 1000 MG/10ML IV SOLN
INTRAVENOUS | Status: DC | PRN
  Administered 2019-05-27: 2000 mg via TOPICAL

## 2019-05-27 MED ORDER — MIDAZOLAM HCL 5 MG/5ML IJ SOLN
INTRAMUSCULAR | Status: DC | PRN
  Administered 2019-05-27: .5 mg via INTRAVENOUS

## 2019-05-27 MED ORDER — ONDANSETRON HCL 4 MG PO TABS
4.0000 mg | ORAL_TABLET | Freq: Four times a day (QID) | ORAL | Status: DC | PRN
  Filled 2019-05-27: qty 1

## 2019-05-27 MED ORDER — BUPROPION HCL ER (XL) 150 MG PO TB24: 150.0000 mg | ORAL_TABLET | Freq: Every day | ORAL | Status: DC

## 2019-05-27 MED ORDER — ROPIVACAINE HCL 7.5 MG/ML IJ SOLN
INTRAMUSCULAR | Status: DC | PRN
  Administered 2019-05-27: 30 mL via PERINEURAL

## 2019-05-27 MED ORDER — TRANEXAMIC ACID-NACL 1000-0.7 MG/100ML-% IV SOLN
1000.0000 mg | INTRAVENOUS | Status: AC
  Administered 2019-05-27: 1000 mg via INTRAVENOUS
  Filled 2019-05-27: qty 100

## 2019-05-27 MED ORDER — HYDROMORPHONE HCL 1 MG/ML IJ SOLN: 0.5000 mg | INTRAMUSCULAR | Status: DC | PRN

## 2019-05-27 MED ORDER — FENTANYL CITRATE (PF) 100 MCG/2ML IJ SOLN
INTRAMUSCULAR | Status: AC
  Filled 2019-05-27: qty 2

## 2019-05-27 MED ORDER — FENTANYL CITRATE (PF) 100 MCG/2ML IJ SOLN
INTRAMUSCULAR | Status: DC | PRN
  Administered 2019-05-27: 50 ug via INTRAVENOUS

## 2019-05-27 MED ORDER — OXYCODONE HCL 5 MG PO TABS
5.0000 mg | ORAL_TABLET | ORAL | Status: DC | PRN
  Administered 2019-05-27: 10 mg via ORAL

## 2019-05-27 MED ORDER — ASPIRIN 81 MG PO CHEW: 81.0000 mg | CHEWABLE_TABLET | Freq: Two times a day (BID) | ORAL | Status: DC

## 2019-05-27 MED ORDER — DEXAMETHASONE SODIUM PHOSPHATE 10 MG/ML IJ SOLN: 10.0000 mg | Freq: Once | INTRAMUSCULAR | Status: DC

## 2019-05-27 MED ORDER — BISACODYL 5 MG PO TBEC: 5.0000 mg | DELAYED_RELEASE_TABLET | Freq: Every day | ORAL | Status: DC | PRN

## 2019-05-27 MED ORDER — CEFAZOLIN SODIUM-DEXTROSE 2-4 GM/100ML-% IV SOLN
2.0000 g | INTRAVENOUS | Status: AC
  Administered 2019-05-27: 11:00:00 2 g via INTRAVENOUS
  Filled 2019-05-27: qty 100

## 2019-05-27 MED ORDER — LEVOTHYROXINE SODIUM 150 MCG PO TABS: 150.0000 ug | ORAL_TABLET | Freq: Every day | ORAL | Status: DC

## 2019-05-27 MED ORDER — DEXAMETHASONE SODIUM PHOSPHATE 10 MG/ML IJ SOLN
INTRAMUSCULAR | Status: DC | PRN
  Administered 2019-05-27: 10 mg

## 2019-05-27 MED ORDER — OXYCODONE-ACETAMINOPHEN 5-325 MG PO TABS: 1.0000 | ORAL_TABLET | ORAL | 0 refills | Status: DC | PRN

## 2019-05-27 MED ORDER — DOCUSATE SODIUM 100 MG PO CAPS: 100.0000 mg | ORAL_CAPSULE | Freq: Two times a day (BID) | ORAL | Status: DC

## 2019-05-27 MED ORDER — ONDANSETRON HCL 4 MG/2ML IJ SOLN
INTRAMUSCULAR | Status: DC | PRN
  Administered 2019-05-27: 4 mg via INTRAVENOUS

## 2019-05-27 MED ORDER — METOCLOPRAMIDE HCL 5 MG PO TABS
5.0000 mg | ORAL_TABLET | Freq: Three times a day (TID) | ORAL | Status: DC | PRN
  Filled 2019-05-27: qty 2

## 2019-05-27 MED ORDER — ONDANSETRON HCL 4 MG/2ML IJ SOLN
INTRAMUSCULAR | Status: AC
  Filled 2019-05-27: qty 2

## 2019-05-27 MED ORDER — FENTANYL CITRATE (PF) 100 MCG/2ML IJ SOLN: 25.0000 ug | INTRAMUSCULAR | Status: DC | PRN

## 2019-05-27 MED ORDER — POVIDONE-IODINE 10 % EX SWAB
2.0000 "application " | Freq: Once | CUTANEOUS | Status: AC
  Administered 2019-05-27: 2 via TOPICAL

## 2019-05-27 MED ORDER — METOCLOPRAMIDE HCL 5 MG/ML IJ SOLN: 5.0000 mg | Freq: Three times a day (TID) | INTRAMUSCULAR | Status: DC | PRN

## 2019-05-27 MED ORDER — SODIUM CHLORIDE (PF) 0.9 % IJ SOLN
INTRAMUSCULAR | Status: DC | PRN
  Administered 2019-05-27: 70 mL

## 2019-05-27 MED ORDER — TRANEXAMIC ACID-NACL 1000-0.7 MG/100ML-% IV SOLN
1000.0000 mg | Freq: Once | INTRAVENOUS | Status: AC
  Administered 2019-05-27: 1000 mg via INTRAVENOUS

## 2019-05-27 MED ORDER — MIDAZOLAM HCL 2 MG/2ML IJ SOLN
1.0000 mg | Freq: Once | INTRAMUSCULAR | Status: AC
  Administered 2019-05-27: 2 mg via INTRAVENOUS
  Filled 2019-05-27: qty 2

## 2019-05-27 MED ORDER — PROPOFOL 10 MG/ML IV BOLUS
INTRAVENOUS | Status: AC
  Filled 2019-05-27: qty 20

## 2019-05-27 MED ORDER — PHENOL 1.4 % MT LIQD: 1.0000 | OROMUCOSAL | Status: DC | PRN

## 2019-05-27 MED ORDER — LACTATED RINGERS IV SOLN: INTRAVENOUS | Status: DC

## 2019-05-27 MED ORDER — DIPHENHYDRAMINE HCL 12.5 MG/5ML PO ELIX: 12.5000 mg | ORAL_SOLUTION | ORAL | Status: DC | PRN

## 2019-05-27 MED ORDER — ASPIRIN EC 81 MG PO TBEC: 81.0000 mg | DELAYED_RELEASE_TABLET | Freq: Two times a day (BID) | ORAL | 0 refills | Status: DC

## 2019-05-27 MED ORDER — LACTATED RINGERS IV BOLUS
500.0000 mL | Freq: Once | INTRAVENOUS | Status: AC
  Administered 2019-05-27: 500 mL via INTRAVENOUS

## 2019-05-27 MED ORDER — MEPIVACAINE HCL (PF) 2 % IJ SOLN
INTRAMUSCULAR | Status: AC
  Filled 2019-05-27: qty 20

## 2019-05-27 MED ORDER — BUPIVACAINE HCL (PF) 0.25 % IJ SOLN
INTRAMUSCULAR | Status: DC | PRN
  Administered 2019-05-27: 30 mL

## 2019-05-27 MED ORDER — TRANEXAMIC ACID-NACL 1000-0.7 MG/100ML-% IV SOLN
INTRAVENOUS | Status: AC
  Filled 2019-05-27: qty 100

## 2019-05-27 MED ORDER — OXYCODONE HCL 5 MG/5ML PO SOLN: 5.0000 mg | Freq: Once | ORAL | Status: DC | PRN

## 2019-05-27 MED ORDER — ONDANSETRON HCL 4 MG/2ML IJ SOLN: 4.0000 mg | Freq: Once | INTRAMUSCULAR | Status: DC | PRN

## 2019-05-27 MED ORDER — PHENYLEPHRINE HCL-NACL 10-0.9 MG/250ML-% IV SOLN
INTRAVENOUS | Status: DC | PRN
  Administered 2019-05-27: 30 ug/min via INTRAVENOUS

## 2019-05-27 MED ORDER — BUPIVACAINE LIPOSOME 1.3 % IJ SUSP
INTRAMUSCULAR | Status: DC | PRN
  Administered 2019-05-27: 20 mL

## 2019-05-27 MED ORDER — MEPIVACAINE HCL (PF) 2 % IJ SOLN
INTRAMUSCULAR | Status: DC | PRN
  Administered 2019-05-27: 3 mL via EPIDURAL

## 2019-05-27 MED ORDER — GABAPENTIN 100 MG PO CAPS: 100.0000 mg | ORAL_CAPSULE | Freq: Three times a day (TID) | ORAL | Status: DC

## 2019-05-27 MED ORDER — DEXAMETHASONE SODIUM PHOSPHATE 10 MG/ML IJ SOLN
INTRAMUSCULAR | Status: DC | PRN
  Administered 2019-05-27: 8 mg via INTRAVENOUS

## 2019-05-27 MED ORDER — EPHEDRINE 5 MG/ML INJ
INTRAVENOUS | Status: AC
  Filled 2019-05-27: qty 10

## 2019-05-27 MED ORDER — PROPOFOL 10 MG/ML IV BOLUS
INTRAVENOUS | Status: AC
  Filled 2019-05-27: qty 60

## 2019-05-27 MED ORDER — MEMANTINE HCL 10 MG PO TABS: 10.0000 mg | ORAL_TABLET | Freq: Two times a day (BID) | ORAL | Status: DC

## 2019-05-27 MED ORDER — MENTHOL 3 MG MT LOZG: 1.0000 | LOZENGE | OROMUCOSAL | Status: DC | PRN

## 2019-05-27 MED ORDER — FENTANYL CITRATE (PF) 100 MCG/2ML IJ SOLN
50.0000 ug | Freq: Once | INTRAMUSCULAR | Status: AC
  Administered 2019-05-27: 50 ug via INTRAVENOUS
  Filled 2019-05-27: qty 2

## 2019-05-27 MED ORDER — MOMETASONE FURO-FORMOTEROL FUM 200-5 MCG/ACT IN AERO: 2.0000 | INHALATION_SPRAY | Freq: Two times a day (BID) | RESPIRATORY_TRACT | Status: DC

## 2019-05-27 MED ORDER — TIZANIDINE HCL 2 MG PO TABS: 2.0000 mg | ORAL_TABLET | Freq: Four times a day (QID) | ORAL | 0 refills | Status: DC | PRN

## 2019-05-27 MED ORDER — FUROSEMIDE 20 MG PO TABS: 20.0000 mg | ORAL_TABLET | Freq: Every day | ORAL | Status: DC

## 2019-05-27 MED ORDER — POLYETHYLENE GLYCOL 3350 17 G PO PACK: 17.0000 g | PACK | Freq: Every day | ORAL | Status: DC | PRN

## 2019-05-27 MED ORDER — OXYCODONE HCL 5 MG PO TABS
ORAL_TABLET | ORAL | Status: AC
  Filled 2019-05-27: qty 2

## 2019-05-27 MED ORDER — OXYCODONE HCL 5 MG PO TABS: 5.0000 mg | ORAL_TABLET | Freq: Once | ORAL | Status: DC | PRN

## 2019-05-27 MED ORDER — SODIUM CHLORIDE (PF) 0.9 % IJ SOLN
INTRAMUSCULAR | Status: AC
  Filled 2019-05-27: qty 100

## 2019-05-27 SURGICAL SUPPLY — 56 items
ATTUNE PS FEM LT SZ 5 CEM KNEE (Femur) ×1 IMPLANT
ATTUNE PSRP INSR SZ5 5 KNEE (Insert) ×1 IMPLANT
BAG DECANTER FOR FLEXI CONT (MISCELLANEOUS) ×2 IMPLANT
BAG SPEC THK2 15X12 ZIP CLS (MISCELLANEOUS) ×1
BAG ZIPLOCK 12X15 (MISCELLANEOUS) ×2 IMPLANT
BASE TIBIA ATTUNE KNEE SYS SZ6 (Knees) IMPLANT
BLADE SAG 18X100X1.27 (BLADE) ×2 IMPLANT
BLADE SAW SGTL 11.0X1.19X90.0M (BLADE) ×2 IMPLANT
BLADE SURG SZ10 CARB STEEL (BLADE) ×4 IMPLANT
BNDG CMPR MED 10X6 ELC LF (GAUZE/BANDAGES/DRESSINGS) ×1
BNDG CMPR MED 15X6 ELC VLCR LF (GAUZE/BANDAGES/DRESSINGS) ×1
BNDG ELASTIC 6X10 VLCR STRL LF (GAUZE/BANDAGES/DRESSINGS) ×2 IMPLANT
BNDG ELASTIC 6X15 VLCR STRL LF (GAUZE/BANDAGES/DRESSINGS) ×1 IMPLANT
BOWL SMART MIX CTS (DISPOSABLE) ×2 IMPLANT
BSPLAT TIB 6 CMNT ROT PLAT STR (Knees) ×1 IMPLANT
CEMENT HV SMART SET (Cement) ×4 IMPLANT
COVER SURGICAL LIGHT HANDLE (MISCELLANEOUS) ×2 IMPLANT
COVER WAND RF STERILE (DRAPES) IMPLANT
CUFF TOURN SGL QUICK 34 (TOURNIQUET CUFF) ×2
CUFF TRNQT CYL 34X4.125X (TOURNIQUET CUFF) ×1 IMPLANT
DECANTER SPIKE VIAL GLASS SM (MISCELLANEOUS) ×5 IMPLANT
DRAPE U-SHAPE 47X51 STRL (DRAPES) ×2 IMPLANT
DRSG AQUACEL AG ADV 3.5X10 (GAUZE/BANDAGES/DRESSINGS) ×2 IMPLANT
DURAPREP 26ML APPLICATOR (WOUND CARE) ×2 IMPLANT
ELECT REM PT RETURN 15FT ADLT (MISCELLANEOUS) ×2 IMPLANT
GLOVE BIO SURGEON STRL SZ7.5 (GLOVE) ×2 IMPLANT
GLOVE BIO SURGEON STRL SZ8.5 (GLOVE) ×2 IMPLANT
GLOVE BIOGEL PI IND STRL 8 (GLOVE) ×1 IMPLANT
GLOVE BIOGEL PI IND STRL 9 (GLOVE) ×1 IMPLANT
GLOVE BIOGEL PI INDICATOR 8 (GLOVE) ×1
GLOVE BIOGEL PI INDICATOR 9 (GLOVE) ×1
GOWN STRL REUS W/TWL XL LVL3 (GOWN DISPOSABLE) ×4 IMPLANT
HANDPIECE INTERPULSE COAX TIP (DISPOSABLE) ×2
HOOD PEEL AWAY FLYTE STAYCOOL (MISCELLANEOUS) ×6 IMPLANT
KIT TURNOVER KIT A (KITS) ×1 IMPLANT
NDL HYPO 21X1.5 SAFETY (NEEDLE) ×2 IMPLANT
NEEDLE HYPO 21X1.5 SAFETY (NEEDLE) ×4 IMPLANT
NS IRRIG 1000ML POUR BTL (IV SOLUTION) ×2 IMPLANT
PACK ICE MAXI GEL EZY WRAP (MISCELLANEOUS) ×2 IMPLANT
PACK TOTAL KNEE CUSTOM (KITS) ×2 IMPLANT
PATELLA MEDIAL ATTUN 35MM KNEE (Knees) ×1 IMPLANT
PENCIL SMOKE EVACUATOR (MISCELLANEOUS) IMPLANT
PIN DRILL FIX HALF THREAD (BIT) ×1 IMPLANT
PIN FIX SIGMA LCS THRD HI (PIN) ×1 IMPLANT
PROTECTOR NERVE ULNAR (MISCELLANEOUS) ×2 IMPLANT
SET HNDPC FAN SPRY TIP SCT (DISPOSABLE) ×1 IMPLANT
SUT VIC AB 1 CTX 36 (SUTURE) ×2
SUT VIC AB 1 CTX36XBRD ANBCTR (SUTURE) ×1 IMPLANT
SUT VIC AB 3-0 CT1 27 (SUTURE) ×6
SUT VIC AB 3-0 CT1 TAPERPNT 27 (SUTURE) ×3 IMPLANT
SYR CONTROL 10ML LL (SYRINGE) ×4 IMPLANT
TIBIA ATTUNE KNEE SYS BASE SZ6 (Knees) ×2 IMPLANT
TRAY FOLEY MTR SLVR 14FR STAT (SET/KITS/TRAYS/PACK) ×1 IMPLANT
WATER STERILE IRR 1000ML POUR (IV SOLUTION) ×4 IMPLANT
WRAP KNEE MAXI GEL POST OP (GAUZE/BANDAGES/DRESSINGS) ×1 IMPLANT
YANKAUER SUCT BULB TIP 10FT TU (MISCELLANEOUS) ×2 IMPLANT

## 2019-05-27 NOTE — Interval H&P Note (Signed)
History and Physical Interval Note:  05/27/2019 8:13 AM  Lisa Parker  has presented today for surgery, with the diagnosis of LEFT KNEE OSTEOARTHRITIS.  The various methods of treatment have been discussed with the patient and family. After consideration of risks, benefits and other options for treatment, the patient has consented to  Procedure(s): LEFT TOTAL KNEE ARTHROPLASTY SAMD DAY DISCHARGE (Left) as a surgical intervention.  The patient's history has been reviewed, patient examined, no change in status, stable for surgery.  I have reviewed the patient's chart and labs.  Questions were answered to the patient's satisfaction.     [name]

## 2019-05-27 NOTE — Op Note (Signed)
PATIENT ID:      Lisa Parker  MRN:     [id number] DOB/AGE:    06/15/1945 / 74 y.o.       OPERATIVE REPORT   DATE OF PROCEDURE:  05/27/2019      PREOPERATIVE DIAGNOSIS:   LEFT KNEE OSTEOARTHRITIS      Estimated body mass index is 28.15 kg/m as calculated from the following:   Height as of 05/25/19: 5\' 6"  (1.676 m).   Weight as of 05/25/19: 79.1 kg.                                                       POSTOPERATIVE DIAGNOSIS:   LEFT KNEE OSTEOARTHRITIS                                                                       PROCEDURE:  Procedure(s): LEFT TOTAL KNEE ARTHROPLASTY SAMD DAY DISCHARGE Using DepuyAttune RP implants #5L Femur, #6Tibia, 5 mm Attune RP bearing, 35 Patella    SURGEON: [name]  ASSISTANT:   Kerry Hough. [employer]   (Present and scrubbed throughout the case, critical for assistance with exposure, retraction, instrumentation, and closure.)        ANESTHESIA: Spinal, 20cc Exparel, 50cc 0.25% Marcaine EBL: 300 cc FLUID REPLACEMENT: 1600 cc crystaloid TOURNIQUET: DRAINS: None TRANEXAMIC ACID: 1gm IV, 2gm topical COMPLICATIONS:  None         INDICATIONS FOR PROCEDURE: The patient has  LEFT KNEE OSTEOARTHRITIS, Val deformities, XR shows bone on bone arthritis, lateral subluxation of tibia. Patient has failed all conservative measures including anti-inflammatory medicines, narcotics, attempts at exercise and weight loss, cortisone injections and viscosupplementation.  Risks and benefits of surgery have been discussed, questions answered.   DESCRIPTION OF PROCEDURE: The patient identified by armband, received  IV antibiotics, in the holding area at [facility]. Patient taken to the operating room, appropriate anesthetic monitors were attached, and Spinal anesthesia was  induced. IV Tranexamic acid was given.Tourniquet applied high to the operative thigh. Lateral post and foot positioner applied to the table, the lower extremity was then prepped and draped in  usual sterile fashion from the toes to the tourniquet. Time-out procedure was performed. The skin and subcutaneous tissue along the incision was injected with 20 cc of a mixture of Exparel and Marcaine solution, using a 20-gauge by 1-1/2 inch needle. We began the operation, with the knee flexed 130 degrees, by making the anterior midline incision starting at handbreadth above the patella going over the patella 1 cm medial to and 4 cm distal to the tibial tubercle. Small bleeders in the skin and the subcutaneous tissue identified and cauterized. Transverse retinaculum was incised and reflected medially and a medial parapatellar arthrotomy was accomplished. the patella was everted and theprepatellar fat pad resected. The superficial medial collateral ligament was then elevated from anterior to posterior along the proximal flare of the tibia and anterior half of the menisci resected. The knee was hyperflexed exposing bone on bone arthritis. Peripheral and notch osteophytes as well as the cruciate ligaments were then resected.  We continued to work our way around posteriorly along the proximal tibia, and externally rotated the tibia subluxing it out from underneath the femur. A McHale PCL retractor was placed through the notch and a lateral Hohmann retractor placed, and we then entered the proximal tibia in line with the Depuy starter drill in line with the axis of the tibia followed by an intramedullary guide rod and 0-degree posterior slope cutting guide. The tibial cutting guide, 4 degree posterior sloped, was pinned into place allowing resection of 9 mm of bone medially and 1 mm of bone laterally. Satisfied with the tibial resection, we then entered the distal femur 2 mm anterior to the PCL origin with the intramedullary guide rod and applied the distal femoral cutting guide set at 9 mm, with 5 degrees of valgus. This was pinned along the epicondylar axis. At this point, the distal femoral cut was accomplished  without difficulty. We then sized for a #5L femoral component and pinned the guide in 0 degrees of external rotation. The chamfer cutting guide was pinned into place. The anterior, posterior, and chamfer cuts were accomplished without difficulty followed by the Attune RP box cutting guide and the box cut. We also removed posterior osteophytes from the posterior femoral condyles. The posterior capsule was injected with Exparel solution. The knee was brought into full extension. We checked our extension gap and fit a 5 mm bearing. Distracting in extension with a lamina spreader,  bleeders in the posterior capsule, Posterior medial and posterior lateral gutter were cauterized.  The transexamic acid-soaked sponge was then placed in the gap of the knee in extension. The knee was flexed 30. The posterior patella cut was accomplished with the 9.5 mm Attune cutting guide, sized for a 30mm dome, and the fixation pegs drilled.The knee was then once again hyperflexed exposing the proximal tibia. We sized for a # 6 tibial base plate, applied the smokestack and the conical reamer followed by the the Delta fin keel punch. We then hammered into place the Attune RP trial femoral component, drilled the lugs, inserted a  5 mm trial bearing, trial patellar button, and took the knee through range of motion from 0-130 degrees. Medial and lateral ligamentous stability was checked. No thumb pressure was required for patellar Tracking. The tourniquet was not used. All trial components were removed, mating surfaces irrigated with pulse lavage, and dried with suction and sponges. 10 cc of the Exparel solution was applied to the cancellus bone of the patella distal femur and proximal tibia.  After waiting 30 seconds, the bony surfaces were again, dried with sponges. A double batch of DePuy HV cement was mixed and applied to all bony metallic mating surfaces except for the posterior condyles of the femur itself. In order, we hammered into  place the tibial tray and removed excess cement, the femoral component and removed excess cement. The final Attune RP bearing was inserted, and the knee brought to full extension with compression. The patellar button was clamped into place, and excess cement removed. The knee was held at 30 flexion with compression, while the cement cured. The wound was irrigated out with normal saline solution pulse lavage. The rest of the Exparel was injected into the parapatellar arthrotomy, subcutaneous tissues, and periosteal tissues. The parapatellar arthrotomy was closed with running #1 Vicryl suture. The subcutaneous tissue with 0 and 2-0 undyed Vicryl suture, and the skin with running 3-0 SQ vicryl. An Aquacil and Ace wrap were applied. The patient was taken to recovery  room without difficulty.   [name] 05/27/2019, 12:42 PM

## 2019-05-27 NOTE — Discharge Instructions (Addendum)

## 2019-05-27 NOTE — Anesthesia Postprocedure Evaluation (Signed)
Anesthesia Post Note  Patient: Lisa Parker  Procedure(s) Performed: LEFT TOTAL KNEE ARTHROPLASTY SAMD DAY DISCHARGE (Left Knee)     Anesthesia Type: Spinal    Last Vitals:  Vitals:   05/27/19 1530 05/27/19 1647  BP: 133/73 114/62  Pulse: 63 79  Resp: 20 16  Temp:  36.4 C  SpO2: 100% 100%    Last Pain:  Vitals:   05/27/19 1647  TempSrc:   PainSc: 6                  Logann Whitebread

## 2019-05-27 NOTE — Evaluation (Signed)
Physical Therapy Evaluation Patient Details Name: Lisa Parker MRN: [id number] DOB: [birth year] Today's Date: 05/27/2019   History of Present Illness  s/p L TKA. PMH: R TKA  Clinical Impression  Patient evaluated by Physical Therapy with no further acute PT needs identified. All education has been completed and the patient has no further questions.  Pt amb ~ 100' with RW and min/guard for safety.  Verbally reviewed stairs and pt declines practice as she is familiar with technique from recent R TKA. recommnend pt have supervision for mobility for safety at home. Reviewed TKA HEP and pt tol well. Ready to d/c from PT standpoint   See below for any follow-up Physical Therapy or equipment needs. PT is signing off. Thank you for this referral.     Follow Up Recommendations Follow surgeon's recommendation for DC plan and follow-up therapies    Equipment Recommendations  None recommended by PT    Recommendations for Other Services       Precautions / Restrictions Precautions Precautions: Fall;Knee Restrictions Weight Bearing Restrictions: No      Mobility  Bed Mobility Overal bed mobility: Needs Assistance Bed Mobility: Supine to Sit;Sit to Supine     Supine to sit: Min guard Sit to supine: Min guard   General bed mobility comments: for safety  Transfers Overall transfer level: Needs assistance Equipment used: Rolling walker (2 wheeled) Transfers: Sit to/from Stand Sit to Stand: Min guard         General transfer comment: cues for hand placement  Ambulation/Gait Ambulation/Gait assistance: Min guard Gait Distance (Feet): 100 Feet Assistive device: Rolling walker (2 wheeled) Gait Pattern/deviations: Step-to pattern;Decreased stance time - left;Decreased weight shift to left;Trunk flexed     General Gait Details: initial cues for sequence and RW position from self; min/guard for safety  Stairs            Wheelchair Mobility    Modified Rankin (Stroke  Patients Only)       Balance                                             Pertinent Vitals/Pain Pain Assessment: 0-10 Pain Score: 7  Pain Location: L knee Pain Descriptors / Indicators: Grimacing;Guarding Pain Intervention(s): Limited activity within patient's tolerance;Monitored during session;Premedicated before session    Home Living Family/patient expects to be discharged to:: Private residence Living Arrangements: Spouse/significant other Available Help at Discharge: Family Type of Home: House Home Access: Stairs to enter   Technical brewer of Steps: 5 Home Layout: One level Home Equipment: Environmental consultant - 2 wheels      Prior Function Level of Independence: Independent               Hand Dominance        Extremity/Trunk Assessment   Upper Extremity Assessment Upper Extremity Assessment: Overall WFL for tasks assessed    Lower Extremity Assessment Lower Extremity Assessment: LLE deficits/detail LLE Deficits / Details: knee extension and hip flexion 2+ to 3/5. ankle limited ROM at baseline       Communication   Communication: No difficulties  Cognition Arousal/Alertness: Awake/alert Behavior During Therapy: WFL for tasks assessed/performed Overall Cognitive Status: Within Functional Limits for tasks assessed  General Comments      Exercises Total Joint Exercises Ankle Circles/Pumps: AROM;10 reps;Both Quad Sets: AROM;10 reps;Both Heel Slides: AAROM;10 reps;Left Hip ABduction/ADduction: AROM;AAROM;Left;10 reps Straight Leg Raises: 10 reps;AAROM;AROM Goniometric ROM: grossly 5 to 60 degrees flexion   Assessment/Plan    PT Assessment All further PT needs can be met in the next venue of care  PT Problem List         PT Treatment Interventions      PT Goals (Current goals can be found in the Care Plan section)  Acute Rehab PT Goals Patient Stated Goal: go home  today PT Goal Formulation: All assessment and education complete, DC therapy    Frequency     Barriers to discharge        Co-evaluation               AM-PAC PT "6 Clicks" Mobility  Outcome Measure Help needed turning from your back to your side while in a flat bed without using bedrails?: A Little Help needed moving from lying on your back to sitting on the side of a flat bed without using bedrails?: A Little Help needed moving to and from a bed to a chair (including a wheelchair)?: A Little Help needed standing up from a chair using your arms (e.g., wheelchair or bedside chair)?: A Little Help needed to walk in hospital room?: A Little Help needed climbing 3-5 steps with a railing? : A Little 6 Click Score: 18    End of Session Equipment Utilized During Treatment: Gait belt Activity Tolerance: Patient tolerated treatment well Patient left: in bed;with call bell/phone within reach Nurse Communication: Mobility status PT Visit Diagnosis: Difficulty in walking, not elsewhere classified (R26.2)    Time: 1610-1632 PT Time Calculation (min) (ACUTE ONLY): 22 min   Charges:   PT Evaluation $PT Eval Low Complexity: 1 Low          , PT   Acute Rehab Dept ([facility]): [phone]   05/27/2019   , 05/27/2019, 4:37 PM   

## 2019-05-27 NOTE — Anesthesia Procedure Notes (Signed)
Anesthesia Regional Block: Adductor canal block   Pre-Anesthetic Checklist: ,, timeout performed, Correct Patient, Correct Site, Correct Laterality, Correct Procedure, Correct Position, site marked, Risks and benefits discussed,  Surgical consent,  Pre-op evaluation,  At surgeon's request and post-op pain management  Laterality: Left  Prep: chloraprep       Needles:  Injection technique: Single-shot  Needle Type: Echogenic Stimulator Needle     Needle Length: 5cm  Needle Gauge: 22     Additional Needles:   Procedures:, nerve stimulator,,, ultrasound used (permanent image in chart),,,,  Narrative:  Start time: 05/27/2019 9:25 AM End time: 05/27/2019 9:28 AM Injection made incrementally with aspirations every 5 mL.  Performed by: Personally  Anesthesiologist: Janeece Riggers, MD  Additional Notes: Functioning IV was confirmed and monitors were applied.  A 45mm 22ga Arrow echogenic stimulator needle was used. Sterile prep and drape,hand hygiene and sterile gloves were used. Ultrasound guidance: relevant anatomy identified, needle position confirmed, local anesthetic spread visualized around nerve(s)., vascular puncture avoided.  Image printed for medical record. Negative aspiration and negative test dose prior to incremental administration of local anesthetic. The patient tolerated the procedure well.

## 2019-05-27 NOTE — Anesthesia Procedure Notes (Addendum)
Procedure Name: MAC Date/Time: 05/27/2019 11:08 AM Performed by: Lissa Morales, CRNA Pre-anesthesia Checklist: Patient identified, Emergency Drugs available, Suction available and Patient being monitored Patient Re-evaluated:Patient Re-evaluated prior to induction Oxygen Delivery Method: Simple face mask Placement Confirmation: positive ETCO2

## 2019-05-27 NOTE — Transfer of Care (Signed)
Immediate Anesthesia Transfer of Care Note  Patient: Lisa Parker  Procedure(s) Performed: LEFT TOTAL KNEE ARTHROPLASTY SAMD DAY DISCHARGE (Left Knee)  Patient Location: PACU  Anesthesia Type:Spinal  Level of Consciousness: awake, alert , oriented and patient cooperative  Airway & Oxygen Therapy: Patient Spontanous Breathing and Patient connected to face mask oxygen  Post-op Assessment: Report given to RN and Post -op Vital signs reviewed and stable  Post vital signs: stable  Last Vitals:  Vitals Value Taken Time  BP 110/44 05/27/19 1304  Temp    Pulse 55 05/27/19 1308  Resp 13 05/27/19 1308  SpO2 100 % 05/27/19 1308  Vitals shown include unvalidated device data.  Last Pain:  Vitals:   05/27/19 0805  TempSrc: Oral         Complications: No apparent anesthesia complications

## 2019-05-27 NOTE — Progress Notes (Signed)
Assisted Dr. Oddono with left, ultrasound guided, adductor canal block. Side rails up, monitors on throughout procedure. See vital signs in flow sheet. Tolerated Procedure well.  

## 2019-05-27 NOTE — Anesthesia Preprocedure Evaluation (Signed)
Anesthesia Evaluation  Patient identified by MRN, date of birth, ID band Patient awake    Reviewed: Allergy & Precautions, NPO status , Patient's Chart, lab work & pertinent test results  History of Anesthesia Complications Negative for: history of anesthetic complications  Airway Mallampati: II  TM Distance: >3 FB Neck ROM: Full    Dental  (+) Dental Advisory Given, Partial Upper   Pulmonary asthma , former smoker,    breath sounds clear to auscultation       Cardiovascular hypertension, Pt. on medications  Rhythm:Regular Rate:Normal     Neuro/Psych PSYCHIATRIC DISORDERS Anxiety negative neurological ROS     GI/Hepatic Neg liver ROS,  S/p gastric sleeve    Endo/Other  Hypothyroidism Morbid obesity  Renal/GU negative Renal ROS     Musculoskeletal  (+) Arthritis ,   Abdominal   Peds  Hematology negative hematology ROS (+)   Anesthesia Other Findings   Reproductive/Obstetrics                             Anesthesia Physical  Anesthesia Plan  ASA: II  Anesthesia Plan: Spinal   Post-op Pain Management:  Regional for Post-op pain   Induction:   PONV Risk Score and Plan: 2 and Treatment may vary due to age or medical condition and Propofol infusion  Airway Management Planned: Natural Airway, Simple Face Mask and Nasal Cannula  Additional Equipment: None  Intra-op Plan:   Post-operative Plan:   Informed Consent: I have reviewed the patients History and Physical, chart, labs and discussed the procedure including the risks, benefits and alternatives for the proposed anesthesia with the patient or authorized representative who has indicated his/her understanding and acceptance.       Plan Discussed with: CRNA, Anesthesiologist and Surgeon  Anesthesia Plan Comments: (Labs reviewed, platelets acceptable. Discussed risks and benefits of spinal, including spinal/epidural  hematoma, infection, failed block, and PDPH. Patient expressed understanding and wished to proceed. )        Anesthesia Quick Evaluation

## 2019-05-27 NOTE — Anesthesia Procedure Notes (Addendum)
Spinal  Patient location during procedure: OR End time: 05/27/2019 11:20 AM Staffing Performed: anesthesiologist  Anesthesiologist: Janeece Riggers, MD Preanesthetic Checklist Completed: patient identified, IV checked, site marked, risks and benefits discussed, surgical consent, monitors and equipment checked, pre-op evaluation and timeout performed Spinal Block Patient position: sitting Prep: DuraPrep Patient monitoring: heart rate, continuous pulse ox and blood pressure Approach: midline Location: L2-3 Injection technique: single-shot Needle Needle type: Quincke  Needle gauge: 22 G Needle length: 9 cm Additional Notes Expiration date of kit checked and confirmed. Patient tolerated procedure well, without complications.

## 2019-05-28 ENCOUNTER — Encounter: Payer: Self-pay | Admitting: *Deleted

## 2019-05-28 DIAGNOSIS — I509 Heart failure, unspecified: Secondary | ICD-10-CM | POA: Diagnosis not present

## 2019-05-28 DIAGNOSIS — Z7951 Long term (current) use of inhaled steroids: Secondary | ICD-10-CM | POA: Diagnosis not present

## 2019-05-28 DIAGNOSIS — Z471 Aftercare following joint replacement surgery: Secondary | ICD-10-CM | POA: Diagnosis not present

## 2019-05-28 DIAGNOSIS — Z87891 Personal history of nicotine dependence: Secondary | ICD-10-CM | POA: Diagnosis not present

## 2019-05-28 DIAGNOSIS — Z9181 History of falling: Secondary | ICD-10-CM | POA: Diagnosis not present

## 2019-05-28 DIAGNOSIS — Z86718 Personal history of other venous thrombosis and embolism: Secondary | ICD-10-CM | POA: Diagnosis not present

## 2019-05-28 DIAGNOSIS — Z96653 Presence of artificial knee joint, bilateral: Secondary | ICD-10-CM | POA: Diagnosis not present

## 2019-05-28 DIAGNOSIS — Z9884 Bariatric surgery status: Secondary | ICD-10-CM | POA: Diagnosis not present

## 2019-05-28 DIAGNOSIS — E039 Hypothyroidism, unspecified: Secondary | ICD-10-CM | POA: Diagnosis not present

## 2019-05-28 DIAGNOSIS — G3184 Mild cognitive impairment, so stated: Secondary | ICD-10-CM | POA: Diagnosis not present

## 2019-05-28 DIAGNOSIS — I110 Hypertensive heart disease with heart failure: Secondary | ICD-10-CM | POA: Diagnosis not present

## 2019-05-28 DIAGNOSIS — Z9089 Acquired absence of other organs: Secondary | ICD-10-CM | POA: Diagnosis not present

## 2019-05-28 DIAGNOSIS — J45909 Unspecified asthma, uncomplicated: Secondary | ICD-10-CM | POA: Diagnosis not present

## 2019-05-29 DIAGNOSIS — J45909 Unspecified asthma, uncomplicated: Secondary | ICD-10-CM | POA: Diagnosis not present

## 2019-05-29 DIAGNOSIS — Z9181 History of falling: Secondary | ICD-10-CM | POA: Diagnosis not present

## 2019-05-29 DIAGNOSIS — Z9884 Bariatric surgery status: Secondary | ICD-10-CM | POA: Diagnosis not present

## 2019-05-29 DIAGNOSIS — G3184 Mild cognitive impairment, so stated: Secondary | ICD-10-CM | POA: Diagnosis not present

## 2019-05-29 DIAGNOSIS — Z471 Aftercare following joint replacement surgery: Secondary | ICD-10-CM | POA: Diagnosis not present

## 2019-05-29 DIAGNOSIS — Z9089 Acquired absence of other organs: Secondary | ICD-10-CM | POA: Diagnosis not present

## 2019-05-29 DIAGNOSIS — E039 Hypothyroidism, unspecified: Secondary | ICD-10-CM | POA: Diagnosis not present

## 2019-05-29 DIAGNOSIS — Z87891 Personal history of nicotine dependence: Secondary | ICD-10-CM | POA: Diagnosis not present

## 2019-05-29 DIAGNOSIS — Z7951 Long term (current) use of inhaled steroids: Secondary | ICD-10-CM | POA: Diagnosis not present

## 2019-05-29 DIAGNOSIS — I110 Hypertensive heart disease with heart failure: Secondary | ICD-10-CM | POA: Diagnosis not present

## 2019-05-29 DIAGNOSIS — Z86718 Personal history of other venous thrombosis and embolism: Secondary | ICD-10-CM | POA: Diagnosis not present

## 2019-05-29 DIAGNOSIS — Z96653 Presence of artificial knee joint, bilateral: Secondary | ICD-10-CM | POA: Diagnosis not present

## 2019-05-29 DIAGNOSIS — I509 Heart failure, unspecified: Secondary | ICD-10-CM | POA: Diagnosis not present

## 2019-06-01 DIAGNOSIS — E039 Hypothyroidism, unspecified: Secondary | ICD-10-CM | POA: Diagnosis not present

## 2019-06-01 DIAGNOSIS — Z471 Aftercare following joint replacement surgery: Secondary | ICD-10-CM | POA: Diagnosis not present

## 2019-06-01 DIAGNOSIS — Z9089 Acquired absence of other organs: Secondary | ICD-10-CM | POA: Diagnosis not present

## 2019-06-01 DIAGNOSIS — Z7951 Long term (current) use of inhaled steroids: Secondary | ICD-10-CM | POA: Diagnosis not present

## 2019-06-01 DIAGNOSIS — J45909 Unspecified asthma, uncomplicated: Secondary | ICD-10-CM | POA: Diagnosis not present

## 2019-06-01 DIAGNOSIS — G3184 Mild cognitive impairment, so stated: Secondary | ICD-10-CM | POA: Diagnosis not present

## 2019-06-01 DIAGNOSIS — I110 Hypertensive heart disease with heart failure: Secondary | ICD-10-CM | POA: Diagnosis not present

## 2019-06-01 DIAGNOSIS — Z9884 Bariatric surgery status: Secondary | ICD-10-CM | POA: Diagnosis not present

## 2019-06-01 DIAGNOSIS — Z96653 Presence of artificial knee joint, bilateral: Secondary | ICD-10-CM | POA: Diagnosis not present

## 2019-06-01 DIAGNOSIS — I509 Heart failure, unspecified: Secondary | ICD-10-CM | POA: Diagnosis not present

## 2019-06-01 DIAGNOSIS — Z87891 Personal history of nicotine dependence: Secondary | ICD-10-CM | POA: Diagnosis not present

## 2019-06-01 DIAGNOSIS — Z86718 Personal history of other venous thrombosis and embolism: Secondary | ICD-10-CM | POA: Diagnosis not present

## 2019-06-01 DIAGNOSIS — Z9181 History of falling: Secondary | ICD-10-CM | POA: Diagnosis not present

## 2019-06-03 DIAGNOSIS — Z7951 Long term (current) use of inhaled steroids: Secondary | ICD-10-CM | POA: Diagnosis not present

## 2019-06-03 DIAGNOSIS — Z471 Aftercare following joint replacement surgery: Secondary | ICD-10-CM | POA: Diagnosis not present

## 2019-06-03 DIAGNOSIS — J45909 Unspecified asthma, uncomplicated: Secondary | ICD-10-CM | POA: Diagnosis not present

## 2019-06-03 DIAGNOSIS — Z86718 Personal history of other venous thrombosis and embolism: Secondary | ICD-10-CM | POA: Diagnosis not present

## 2019-06-03 DIAGNOSIS — G3184 Mild cognitive impairment, so stated: Secondary | ICD-10-CM | POA: Diagnosis not present

## 2019-06-03 DIAGNOSIS — Z9089 Acquired absence of other organs: Secondary | ICD-10-CM | POA: Diagnosis not present

## 2019-06-03 DIAGNOSIS — I110 Hypertensive heart disease with heart failure: Secondary | ICD-10-CM | POA: Diagnosis not present

## 2019-06-03 DIAGNOSIS — Z87891 Personal history of nicotine dependence: Secondary | ICD-10-CM | POA: Diagnosis not present

## 2019-06-03 DIAGNOSIS — Z96653 Presence of artificial knee joint, bilateral: Secondary | ICD-10-CM | POA: Diagnosis not present

## 2019-06-03 DIAGNOSIS — E039 Hypothyroidism, unspecified: Secondary | ICD-10-CM | POA: Diagnosis not present

## 2019-06-03 DIAGNOSIS — Z9181 History of falling: Secondary | ICD-10-CM | POA: Diagnosis not present

## 2019-06-03 DIAGNOSIS — I509 Heart failure, unspecified: Secondary | ICD-10-CM | POA: Diagnosis not present

## 2019-06-03 DIAGNOSIS — Z9884 Bariatric surgery status: Secondary | ICD-10-CM | POA: Diagnosis not present

## 2019-06-04 ENCOUNTER — Ambulatory Visit: Payer: Medicare Other | Admitting: Neurology

## 2019-06-05 DIAGNOSIS — Z9089 Acquired absence of other organs: Secondary | ICD-10-CM | POA: Diagnosis not present

## 2019-06-05 DIAGNOSIS — I509 Heart failure, unspecified: Secondary | ICD-10-CM | POA: Diagnosis not present

## 2019-06-05 DIAGNOSIS — Z9181 History of falling: Secondary | ICD-10-CM | POA: Diagnosis not present

## 2019-06-05 DIAGNOSIS — Z471 Aftercare following joint replacement surgery: Secondary | ICD-10-CM | POA: Diagnosis not present

## 2019-06-05 DIAGNOSIS — I110 Hypertensive heart disease with heart failure: Secondary | ICD-10-CM | POA: Diagnosis not present

## 2019-06-05 DIAGNOSIS — G3184 Mild cognitive impairment, so stated: Secondary | ICD-10-CM | POA: Diagnosis not present

## 2019-06-05 DIAGNOSIS — Z9884 Bariatric surgery status: Secondary | ICD-10-CM | POA: Diagnosis not present

## 2019-06-05 DIAGNOSIS — E039 Hypothyroidism, unspecified: Secondary | ICD-10-CM | POA: Diagnosis not present

## 2019-06-05 DIAGNOSIS — J45909 Unspecified asthma, uncomplicated: Secondary | ICD-10-CM | POA: Diagnosis not present

## 2019-06-05 DIAGNOSIS — Z87891 Personal history of nicotine dependence: Secondary | ICD-10-CM | POA: Diagnosis not present

## 2019-06-05 DIAGNOSIS — Z7951 Long term (current) use of inhaled steroids: Secondary | ICD-10-CM | POA: Diagnosis not present

## 2019-06-05 DIAGNOSIS — Z86718 Personal history of other venous thrombosis and embolism: Secondary | ICD-10-CM | POA: Diagnosis not present

## 2019-06-05 DIAGNOSIS — Z96653 Presence of artificial knee joint, bilateral: Secondary | ICD-10-CM | POA: Diagnosis not present

## 2019-06-07 DIAGNOSIS — E039 Hypothyroidism, unspecified: Secondary | ICD-10-CM | POA: Diagnosis not present

## 2019-06-08 DIAGNOSIS — Z87891 Personal history of nicotine dependence: Secondary | ICD-10-CM | POA: Diagnosis not present

## 2019-06-08 DIAGNOSIS — I509 Heart failure, unspecified: Secondary | ICD-10-CM | POA: Diagnosis not present

## 2019-06-08 DIAGNOSIS — I110 Hypertensive heart disease with heart failure: Secondary | ICD-10-CM | POA: Diagnosis not present

## 2019-06-08 DIAGNOSIS — J45909 Unspecified asthma, uncomplicated: Secondary | ICD-10-CM | POA: Diagnosis not present

## 2019-06-08 DIAGNOSIS — G3184 Mild cognitive impairment, so stated: Secondary | ICD-10-CM | POA: Diagnosis not present

## 2019-06-08 DIAGNOSIS — Z9181 History of falling: Secondary | ICD-10-CM | POA: Diagnosis not present

## 2019-06-08 DIAGNOSIS — Z86718 Personal history of other venous thrombosis and embolism: Secondary | ICD-10-CM | POA: Diagnosis not present

## 2019-06-08 DIAGNOSIS — Z471 Aftercare following joint replacement surgery: Secondary | ICD-10-CM | POA: Diagnosis not present

## 2019-06-08 DIAGNOSIS — Z96653 Presence of artificial knee joint, bilateral: Secondary | ICD-10-CM | POA: Diagnosis not present

## 2019-06-08 DIAGNOSIS — Z9884 Bariatric surgery status: Secondary | ICD-10-CM | POA: Diagnosis not present

## 2019-06-08 DIAGNOSIS — E039 Hypothyroidism, unspecified: Secondary | ICD-10-CM | POA: Diagnosis not present

## 2019-06-08 DIAGNOSIS — Z9089 Acquired absence of other organs: Secondary | ICD-10-CM | POA: Diagnosis not present

## 2019-06-08 DIAGNOSIS — Z7951 Long term (current) use of inhaled steroids: Secondary | ICD-10-CM | POA: Diagnosis not present

## 2019-06-09 ENCOUNTER — Other Ambulatory Visit: Payer: Self-pay

## 2019-06-09 ENCOUNTER — Other Ambulatory Visit (HOSPITAL_COMMUNITY): Payer: Self-pay | Admitting: Orthopedic Surgery

## 2019-06-09 ENCOUNTER — Ambulatory Visit (HOSPITAL_COMMUNITY)
Admission: RE | Admit: 2019-06-09 | Discharge: 2019-06-09 | Disposition: A | Discharge status: Home / Self Care | Payer: Medicare Other | Source: Ambulatory Visit | Attending: Orthopedic Surgery | Admitting: Orthopedic Surgery

## 2019-06-09 DIAGNOSIS — M1712 Unilateral primary osteoarthritis, left knee: Secondary | ICD-10-CM | POA: Diagnosis not present

## 2019-06-09 DIAGNOSIS — R52 Pain, unspecified: Secondary | ICD-10-CM

## 2019-06-09 NOTE — Progress Notes (Signed)
Left lower extremity venous duplex has been completed. Preliminary results can be found in CV Proc through chart review.  Results were given to Joanell Rising PA at Dr. Damita Dunnings office.  06/09/19 10:27 AM Lisa Parker RVT

## 2019-06-10 ENCOUNTER — Encounter: Payer: Self-pay | Admitting: Physical Therapy

## 2019-06-10 ENCOUNTER — Other Ambulatory Visit: Payer: Self-pay

## 2019-06-10 ENCOUNTER — Ambulatory Visit: Payer: Medicare Other | Attending: Orthopedic Surgery | Admitting: Physical Therapy

## 2019-06-10 DIAGNOSIS — M25662 Stiffness of left knee, not elsewhere classified: Secondary | ICD-10-CM | POA: Diagnosis not present

## 2019-06-10 DIAGNOSIS — M6281 Muscle weakness (generalized): Secondary | ICD-10-CM | POA: Insufficient documentation

## 2019-06-10 DIAGNOSIS — M25562 Pain in left knee: Secondary | ICD-10-CM | POA: Insufficient documentation

## 2019-06-10 NOTE — Therapy (Addendum)
[facility] [place], [place], [address] Phone: [phone]   Fax:  [phone]  Physical Therapy Evaluation  Patient Details  Name: Lisa Parker MRN: [id number] Date of Birth: 1945/08/23 Referring Provider (PT): Frederik Pear, MD   Encounter Date: 06/10/2019  PT End of Session - 06/10/19 1101    Visit Number  1    Number of Visits  18    Date for PT Re-Evaluation  07/29/19    Authorization Type  FOTO;  progress note every 10th visit, KX modifier at 15th visit    PT Start Time  0947    PT Stop Time  1031    PT Time Calculation (min)  44 min    Equipment Utilized During Treatment  --   rolling walker   Activity Tolerance  Patient tolerated treatment well       Past Medical History:  Diagnosis Date  . Anxiety   . Arthritis   . Asthma   . HTN (hypertension)    "i had a gastric sleeve done and lost 100 pounds and i dont have htn anymore, my doctor, Dr Hilma Favors took me off of it "   . Hypothyroid   . Memory loss   . Seasonal allergies   . Sinus bradycardia   . Urinary tract infection     Past Surgical History:  Procedure Laterality Date  . BLEPHAROPLASTY Bilateral   . CATARACT EXTRACTION, BILATERAL     with lens   . CESAREAN SECTION    . FINGER SURGERY     left pinky  . HERNIA REPAIR  2004   inguinal , incisional, navel   . KNEE SURGERY Left    arthroscopic   . LAPAROSCOPIC GASTRIC SLEEVE RESECTION    . MASS EXCISION Left 10/20/2015   Procedure: EXCISION MASS LEFT SMALL FINGER;  Surgeon: Leanora Cover, MD;  Location: [facility];  Service: Orthopedics;  Laterality: Left;  . TONSILLECTOMY AND ADENOIDECTOMY    . TOTAL KNEE ARTHROPLASTY Right 11/24/2018   Procedure: Right Knee Arthroplasty;  Surgeon: Frederik Pear, MD;  Location: [facility];  Service: Orthopedics;  Laterality: Right;  . TOTAL KNEE ARTHROPLASTY Left 05/27/2019   Procedure: LEFT TOTAL KNEE ARTHROPLASTY SAMD DAY DISCHARGE;  Surgeon: Frederik Pear, MD;  Location: [facility];  Service: Orthopedics;  Laterality: Left;    There were no vitals filed for this visit.   Subjective Assessment - 06/10/19 1058    Subjective  COVID-19 screening performed upon arrival.Patient arrives to physical therapy with reports of left knee pain, difficulty walking, limited left knee ROM due to a left total knee replacement on 05/27/2019. Patient ambulates with a rolling walker and has been compliant with HEP provided by home health physical therapist. Patient is able to perform ADLs but with increased time and with intermittent assistance from family. Patient reports pain at worst as 7/10 and pain at best as 4/10. Patient's goals are to improve movement, improve strength, improve ability to perform ADLs and home activities and walk without an AD.    Pertinent History  left TKA 05/27/2019; right TKA 11/24/2018    Limitations  Sitting;Walking;Standing;House hold activities    Diagnostic tests  x-ray    Patient Stated Goals  walk without AD    Currently in Pain?  Yes    Pain Score  4     Pain Location  Knee    Pain Orientation  Left    Pain Descriptors / Indicators  Aching;Constant;Dull  Pain Type  Surgical pain    Pain Onset  1 to 4 weeks ago    Pain Frequency  Constant    Aggravating Factors   "being up on it too long"    Pain Relieving Factors  "rest, ice, rubbing it"    Effect of Pain on Daily Activities  pain with ADLs, increased time to perform activities.         [facility] PT Assessment - 06/10/19 0001      Assessment   Medical Diagnosis  S/P left total knee replacement    Referring Provider (PT)  Frederik Pear, MD    Onset Date/Surgical Date  05/27/19    Next MD Visit  "4 weeks"    Prior Therapy  not for left knee      Precautions   Precautions  Other (comment)    Precaution Comments  No Ultrasound      Restrictions   Weight Bearing Restrictions  No      Balance Screen   Has the patient fallen in the past 6 months  No    Has the patient  had a decrease in activity level because of a fear of falling?   No    Is the patient reluctant to leave their home because of a fear of falling?   No      Home Environment   Living Environment  Private residence    Living Arrangements  Spouse/significant other    Home Access  Stairs to enter    Entrance Stairs-Number of Steps  4    Entrance Stairs-Rails  Can reach both      Prior Function   Level of Independence  Needs assistance with homemaking;Needs assistance with ADLs;Independent with household mobility with device      Observation/Other Assessments   Observations  moderate ecchymosis throughout left LE    Focus on Therapeutic Outcomes (FOTO)   57% limitation      Observation/Other Assessments-Edema    Edema  Circumferential      Circumferential Edema   Circumferential - Right  39 cm mid patella    Circumferential - Left   43 cm mid patella      ROM / Strength   AROM / PROM / Strength  AROM;PROM      AROM   Overall AROM   Deficits    AROM Assessment Site  Knee    Right/Left Knee  Left    Left Knee Extension  4    Left Knee Flexion  88      PROM   Overall PROM   Deficits    PROM Assessment Site  Knee    Right/Left Knee  Left    Left Knee Extension  0    Left Knee Flexion  95      Palpation   Patella mobility  limited superior/inferior patella mobs    Palpation comment  tenderness throughout left knee      Transfers   Comments  independent but slow with raising left LE onto plinth      Ambulation/Gait   Assistive device  Rolling walker    Gait Pattern  Step-through pattern;Decreased stance time - left;Decreased step length - right;Decreased stride length;Decreased hip/knee flexion - left;Antalgic        Vasopneumatic device x15 mins to left knee low pressure at 34 degrees [facility], PT, DPT 06/16/19        Objective measurements completed on examination: See above findings.  PT Education - 06/10/19 1100    Education  Details  heel slides in supine and sitting, hip abduction with theraband.    Person(s) Educated  Patient    Methods  Explanation;Demonstration;Handout    Comprehension  Verbalized understanding          PT Long Term Goals - 06/10/19 1109      PT LONG TERM GOAL #1   Title  Patient will be independent with HEP    Time  6    Period  Weeks    Status  New      PT LONG TERM GOAL #2   Title  Patient will demonstrate 120 degrees of left knee flexion AROM to improve ability to perform functional tasks.    Time  6    Period  Weeks    Status  New      PT LONG TERM GOAL #3   Title  Patient will demonstrate 4+/5 or greater left knee MMT in all planes to improve stability during functional tasks.    Time  6    Period  Weeks    Status  New      PT LONG TERM GOAL #4   Title  Patient will demonstrate reciprocating stair ambulation with one railing to safely enter and exit home.    Time  6    Period  Weeks    Status  Achieved      PT LONG TERM GOAL #5   Title  Patient will demonstrate 0 degrees of left knee extension AROM to improve gait mechanics.    Time  6    Period  Weeks    Status  New             Plan - 06/10/19 1102    Clinical Impression Statement  Patient is a 74 year old female who presents to physical therapy with left knee pain, decreased left knee ROM, and increased edema due to a left total knee replacement on 05/27/2019. Patient noted with moderate ecchymosis throughout the left LE. Patient ambulates with a rolling walker, with decreased left stance time, decreased right step length, and decreased knee flexion during swing. Patient and PT discussed HEP to address deficits. Patient reported understanding and agreement. Patient would benefit from skilled physical therapy to address deficits and address patient's goals.    Personal Factors and Comorbidities  Age;Comorbidity 2    Comorbidities  left TKA 05/27/2019; right TKA 11/24/2018    Examination-Activity Limitations   Stand;Dressing;Hygiene/Grooming;Locomotion Level    Stability/Clinical Decision Making  Stable/Uncomplicated    Clinical Decision Making  Low    Rehab Potential  Excellent    PT Frequency  3x / week    PT Duration  6 weeks    PT Treatment/Interventions  ADLs/Self Care Home Management;Cryotherapy;Electrical Stimulation;Gait training;Stair training;Balance training;Therapeutic exercise;Neuromuscular re-education;Manual techniques;Passive range of motion;Patient/family education;Vasopneumatic Device;Moist Heat;Therapeutic activities;Functional mobility training    PT Next Visit Plan  Nustep, knee flexion AROM in supine, sitting and standing, hip strengthening, PROM to left knee, modalities PRN for pain relief.    PT Home Exercise Plan  see patient education section    Consulted and Agree with Plan of Care  Patient       Patient will benefit from skilled therapeutic intervention in order to improve the following deficits and impairments:  Abnormal gait, Decreased activity tolerance, Decreased balance, Decreased strength, Difficulty walking, Decreased range of motion, Pain, Increased edema  Visit Diagnosis: Acute pain of left knee - Plan: PT  plan of care cert/re-cert  Muscle weakness (generalized) - Plan: PT plan of care cert/re-cert  Stiffness of left knee, not elsewhere classified - Plan: PT plan of care cert/re-cert     Problem List Patient Active Problem List   Diagnosis Date Noted  . Degenerative arthritis of left knee 05/26/2019  . Total knee replacement status, right 11/24/2018  . Osteoarthritis of right knee 11/20/2018  . Mild cognitive impairment 10/09/2016  . High blood pressure 09/15/2013  . Hypothyroidism 09/15/2013  . Obesity (BMI 30-39.9) 09/15/2013  . S/P arthroscopy of left knee 10/02/2011  . Lateral meniscal tear 01/18/2011    Lisa Parker, PT, DPT 06/10/2019, 12:23 PM  [facility] [address] [place], [place],  [address] Phone: [phone]   Fax:  [phone]  Name: Lisa Parker MRN: [id number] Date of Birth: April 01, 1946

## 2019-06-12 DIAGNOSIS — M1712 Unilateral primary osteoarthritis, left knee: Secondary | ICD-10-CM | POA: Diagnosis not present

## 2019-06-12 DIAGNOSIS — Z96652 Presence of left artificial knee joint: Secondary | ICD-10-CM | POA: Diagnosis not present

## 2019-06-16 ENCOUNTER — Ambulatory Visit: Payer: Medicare Other | Admitting: Physical Therapy

## 2019-06-16 ENCOUNTER — Other Ambulatory Visit: Payer: Self-pay

## 2019-06-16 DIAGNOSIS — M25562 Pain in left knee: Secondary | ICD-10-CM | POA: Diagnosis not present

## 2019-06-16 DIAGNOSIS — M25662 Stiffness of left knee, not elsewhere classified: Secondary | ICD-10-CM

## 2019-06-16 DIAGNOSIS — M6281 Muscle weakness (generalized): Secondary | ICD-10-CM

## 2019-06-16 NOTE — Therapy (Signed)
[facility] [place], [place], [address] Phone: [phone]   Fax:  [phone]  Physical Therapy Treatment  Patient Details  Name: Lisa Parker MRN: [id number] Date of Birth: 03-22-1946 Referring Provider (PT): Frederik Pear, MD   Encounter Date: 06/16/2019  PT End of Session - 06/16/19 1008    Visit Number  2    Number of Visits  18    Date for PT Re-Evaluation  07/29/19    Authorization Type  FOTO;  progress note every 10th visit, KX modifier at 15th visit    PT Start Time  0945    PT Stop Time  1040    PT Time Calculation (min)  55 min    Equipment Utilized During Treatment  --   walker   Activity Tolerance  Patient tolerated treatment well       Past Medical History:  Diagnosis Date  . Anxiety   . Arthritis   . Asthma   . HTN (hypertension)    "i had a gastric sleeve done and lost 100 pounds and i dont have htn anymore, my doctor, Dr Hilma Favors took me off of it "   . Hypothyroid   . Memory loss   . Seasonal allergies   . Sinus bradycardia   . Urinary tract infection     Past Surgical History:  Procedure Laterality Date  . BLEPHAROPLASTY Bilateral   . CATARACT EXTRACTION, BILATERAL     with lens   . CESAREAN SECTION    . FINGER SURGERY     left pinky  . HERNIA REPAIR  2004   inguinal , incisional, navel   . KNEE SURGERY Left    arthroscopic   . LAPAROSCOPIC GASTRIC SLEEVE RESECTION    . MASS EXCISION Left 10/20/2015   Procedure: EXCISION MASS LEFT SMALL FINGER;  Surgeon: Leanora Cover, MD;  Location: [facility];  Service: Orthopedics;  Laterality: Left;  . TONSILLECTOMY AND ADENOIDECTOMY    . TOTAL KNEE ARTHROPLASTY Right 11/24/2018   Procedure: Right Knee Arthroplasty;  Surgeon: Frederik Pear, MD;  Location: [facility];  Service: Orthopedics;  Laterality: Right;  . TOTAL KNEE ARTHROPLASTY Left 05/27/2019   Procedure: LEFT TOTAL KNEE ARTHROPLASTY SAMD DAY DISCHARGE;  Surgeon: Frederik Pear, MD;   Location: [facility];  Service: Orthopedics;  Laterality: Left;    There were no vitals filed for this visit.  Subjective Assessment - 06/16/19 1005    Subjective  COVID-19 screening performed upon arrival.  Patient reports falling on Friday and splitting a portion her incision. Patient reported she was going for the telephone and she didn't bring her walker when she fell. Patient went to the MD and X-Ray found TKA was not compromised. 5/10 pain but did not take any medication today as it makes her sleepy.    Pertinent History  left TKA 05/27/2019; right TKA 11/24/2018    Limitations  Sitting;Walking;Standing;House hold activities    Diagnostic tests  x-ray    Patient Stated Goals  walk without AD    Currently in Pain?  Yes    Pain Score  5     Pain Location  Knee    Pain Orientation  Right    Pain Descriptors / Indicators  Aching;Sore    Pain Type  Surgical pain    Pain Onset  1 to 4 weeks ago    Pain Frequency  Constant         [facility] PT Assessment - 06/16/19 0001  Assessment   Medical Diagnosis  S/P left total knee replacement    Referring Provider (PT)  Frederik Pear, MD    Onset Date/Surgical Date  05/27/19    Next MD Visit  "4 weeks"    Prior Therapy  not for left knee      Precautions   Precautions  Other (comment)    Precaution Comments  No Ultrasound      Restrictions   Weight Bearing Restrictions  No                   [facility] Adult PT Treatment/Exercise - 06/16/19 0001      Exercises   Exercises  Knee/Hip      Knee/Hip Exercises: Aerobic   Nustep  level 3 x10 mins      Knee/Hip Exercises: Supine   Short Arc Quad Sets  AAROM;Left;3 sets;10 reps    Heel Slides  AAROM;Left;3 sets;10 reps    Other Supine Knee/Hip Exercises  hip adduction ball squeeze 3" hold x20      Modalities   Modalities  Electrical Stimulation;Vasopneumatic      Electrical Stimulation   Electrical Stimulation Location  left knee    Electrical Stimulation Action  IFC     Electrical Stimulation Parameters  80-150 hz x15 mins    Electrical Stimulation Goals  Pain      Vasopneumatic   Number Minutes Vasopneumatic   15 minutes    Vasopnuematic Location   Knee    Vasopneumatic Pressure  Low    Vasopneumatic Temperature   34      Manual Therapy   Manual Therapy  Passive ROM    Passive ROM  gentle PROM to left shoulder into extension and flexion. Gentle holds at end range and intermittent oscillations and STW/M to decrease pain and guarding                  PT Long Term Goals - 06/10/19 1109      PT LONG TERM GOAL #1   Title  Patient will be independent with HEP    Time  6    Period  Weeks    Status  New      PT LONG TERM GOAL #2   Title  Patient will demonstrate 120 degrees of left knee flexion AROM to improve ability to perform functional tasks.    Time  6    Period  Weeks    Status  New      PT LONG TERM GOAL #3   Title  Patient will demonstrate 4+/5 or greater left knee MMT in all planes to improve stability during functional tasks.    Time  6    Period  Weeks    Status  New      PT LONG TERM GOAL #4   Title  Patient will demonstrate reciprocating stair ambulation with one railing to safely enter and exit home.    Time  6    Period  Weeks    Status  Achieved      PT LONG TERM GOAL #5   Title  Patient will demonstrate 0 degrees of left knee extension AROM to improve gait mechanics.    Time  6    Period  Weeks    Status  New            Plan - 06/16/19 1031    Clinical Impression Statement  Patient arrived with moderate pain but was able to tolerate low level  ROM exercises. Patient noted with increased left warmth and educated it was due to the inflammation from the fall. Patient instructed to continue HEP as tolerable. No adverse affects upon removal of modalities.    Personal Factors and Comorbidities  Age;Comorbidity 2    Comorbidities  left TKA 05/27/2019; right TKA 11/24/2018    Examination-Activity Limitations   Stand;Dressing;Hygiene/Grooming;Locomotion Level    Stability/Clinical Decision Making  Stable/Uncomplicated    Clinical Decision Making  Low    Rehab Potential  Excellent    PT Frequency  3x / week    PT Duration  6 weeks    PT Treatment/Interventions  ADLs/Self Care Home Management;Cryotherapy;Electrical Stimulation;Gait training;Stair training;Balance training;Therapeutic exercise;Neuromuscular re-education;Manual techniques;Passive range of motion;Patient/family education;Vasopneumatic Device;Moist Heat;Therapeutic activities;Functional mobility training    PT Next Visit Plan  Nustep, knee flexion AROM in supine, sitting and standing, hip strengthening, PROM to left knee, modalities PRN for pain relief.    PT Home Exercise Plan  see patient education section    Consulted and Agree with Plan of Care  Patient       Patient will benefit from skilled therapeutic intervention in order to improve the following deficits and impairments:  Abnormal gait, Decreased activity tolerance, Decreased balance, Decreased strength, Difficulty walking, Decreased range of motion, Pain, Increased edema  Visit Diagnosis: Acute pain of left knee  Muscle weakness (generalized)  Stiffness of left knee, not elsewhere classified     Problem List Patient Active Problem List   Diagnosis Date Noted  . Degenerative arthritis of left knee 05/26/2019  . Total knee replacement status, right 11/24/2018  . Osteoarthritis of right knee 11/20/2018  . Mild cognitive impairment 10/09/2016  . High blood pressure 09/15/2013  . Hypothyroidism 09/15/2013  . Obesity (BMI 30-39.9) 09/15/2013  . S/P arthroscopy of left knee 10/02/2011  . Lateral meniscal tear 01/18/2011    Gabriela Eves, PT, DPT 06/16/2019, 10:51 AM  [facility] [place], [place], [address] Phone: [phone]   Fax:  [phone]  Name: Lisa Parker MRN: [id number] Date of Birth:  09-Aug-1945

## 2019-06-18 ENCOUNTER — Encounter: Payer: Self-pay | Admitting: Physical Therapy

## 2019-06-18 ENCOUNTER — Ambulatory Visit: Payer: Medicare Other | Admitting: Physical Therapy

## 2019-06-18 ENCOUNTER — Other Ambulatory Visit: Payer: Self-pay

## 2019-06-18 DIAGNOSIS — M25662 Stiffness of left knee, not elsewhere classified: Secondary | ICD-10-CM | POA: Diagnosis not present

## 2019-06-18 DIAGNOSIS — M6281 Muscle weakness (generalized): Secondary | ICD-10-CM | POA: Diagnosis not present

## 2019-06-18 DIAGNOSIS — M25562 Pain in left knee: Secondary | ICD-10-CM | POA: Diagnosis not present

## 2019-06-18 NOTE — Therapy (Signed)
[facility] [place], [place], [address] Phone: [phone]   Fax:  [phone]  Physical Therapy Treatment  Patient Details  Name: Lisa Parker MRN: [id number] Date of Birth: 12-25-1945 Referring Provider (PT): Frederik Pear, MD   Encounter Date: 06/18/2019  PT End of Session - 06/18/19 1012    Visit Number  3    Number of Visits  18    Date for PT Re-Evaluation  07/29/19    Authorization Type  FOTO;  progress note every 10th visit, KX modifier at 15th visit    PT Start Time  0930    PT Stop Time  1024    PT Time Calculation (min)  54 min    Equipment Utilized During Treatment  Other (comment)   rolling walker   Activity Tolerance  Patient tolerated treatment well    Behavior During Therapy  [facility] for tasks assessed/performed       Past Medical History:  Diagnosis Date  . Anxiety   . Arthritis   . Asthma   . HTN (hypertension)    "i had a gastric sleeve done and lost 100 pounds and i dont have htn anymore, my doctor, Dr Hilma Favors took me off of it "   . Hypothyroid   . Memory loss   . Seasonal allergies   . Sinus bradycardia   . Urinary tract infection     Past Surgical History:  Procedure Laterality Date  . BLEPHAROPLASTY Bilateral   . CATARACT EXTRACTION, BILATERAL     with lens   . CESAREAN SECTION    . FINGER SURGERY     left pinky  . HERNIA REPAIR  2004   inguinal , incisional, navel   . KNEE SURGERY Left    arthroscopic   . LAPAROSCOPIC GASTRIC SLEEVE RESECTION    . MASS EXCISION Left 10/20/2015   Procedure: EXCISION MASS LEFT SMALL FINGER;  Surgeon: Leanora Cover, MD;  Location: [place];  Service: Orthopedics;  Laterality: Left;  . TONSILLECTOMY AND ADENOIDECTOMY    . TOTAL KNEE ARTHROPLASTY Right 11/24/2018   Procedure: Right Knee Arthroplasty;  Surgeon: Frederik Pear, MD;  Location: [facility];  Service: Orthopedics;  Laterality: Right;  . TOTAL KNEE ARTHROPLASTY Left 05/27/2019   Procedure: LEFT TOTAL KNEE ARTHROPLASTY SAMD DAY DISCHARGE;  Surgeon: Frederik Pear, MD;  Location: [facility];  Service: Orthopedics;  Laterality: Left;    There were no vitals filed for this visit.  Subjective Assessment - 06/18/19 0933    Subjective  COVID-19 screening performed upon arrival. Patient reports ongoing discomfort in the left knee, 4/10.    Pertinent History  left TKA 05/27/2019; right TKA 11/24/2018    Limitations  Sitting;Walking;Standing;House hold activities    Diagnostic tests  x-ray    Patient Stated Goals  walk without AD    Currently in Pain?  Yes    Pain Score  4     Pain Orientation  Right    Pain Descriptors / Indicators  Discomfort    Pain Type  Surgical pain    Pain Onset  1 to 4 weeks ago    Pain Frequency  Constant         [facility] PT Assessment - 06/18/19 0001      Assessment   Medical Diagnosis  S/P left total knee replacement    Referring Provider (PT)  Frederik Pear, MD    Onset Date/Surgical Date  05/27/19    Next MD Visit  "4  weeks"    Prior Therapy  not for left knee      Precautions   Precautions  Other (comment)    Precaution Comments  No Ultrasound      Restrictions   Weight Bearing Restrictions  No                   [facility] Adult PT Treatment/Exercise - 06/18/19 0001      Exercises   Exercises  Knee/Hip      Knee/Hip Exercises: Aerobic   Nustep  level 4 x15 mins seat 9 to 7      Knee/Hip Exercises: Standing   Hip Flexion  AROM;Both;2 sets;10 reps;Knee bent    Hip Abduction  AROM;Both;2 sets;10 reps    Rocker Board  3 minutes      Knee/Hip Exercises: Supine   Short Arc Quad Sets  AROM;Left;3 sets;10 reps    Straight Leg Raises  AROM;Left;2 sets;5 reps      Modalities   Modalities  Psychologist, educational Location  left knee    Electrical Stimulation Action  IFC    Electrical Stimulation Parameters  80-150 hz x15 mins    Electrical Stimulation Goals   Pain      Vasopneumatic   Number Minutes Vasopneumatic   15 minutes    Vasopnuematic Location   Knee    Vasopneumatic Pressure  Low    Vasopneumatic Temperature   34      Manual Therapy   Manual Therapy  Joint mobilization    Joint Mobilization  patella mobs to improve ROM.                   PT Long Term Goals - 06/10/19 1109      PT LONG TERM GOAL #1   Title  Patient will be independent with HEP    Time  6    Period  Weeks    Status  New      PT LONG TERM GOAL #2   Title  Patient will demonstrate 120 degrees of left knee flexion AROM to improve ability to perform functional tasks.    Time  6    Period  Weeks    Status  New      PT LONG TERM GOAL #3   Title  Patient will demonstrate 4+/5 or greater left knee MMT in all planes to improve stability during functional tasks.    Time  6    Period  Weeks    Status  New      PT LONG TERM GOAL #4   Title  Patient will demonstrate reciprocating stair ambulation with one railing to safely enter and exit home.    Time  6    Period  Weeks    Status  Achieved      PT LONG TERM GOAL #5   Title  Patient will demonstrate 0 degrees of left knee extension AROM to improve gait mechanics.    Time  6    Period  Weeks    Status  New            Plan - 06/18/19 1013    Clinical Impression Statement  Patient responded well to therapy session with no reports of increased pain. Patient guided through standing exercises and emphasized terminal knee extension when standing on surgical LE. Patient noted with extension lag with SLR with reports of fatigue. Patella mobilizations in the superior/inferior  directions provided to improve ROM. No adverse affects upon removal of modalities.    Personal Factors and Comorbidities  Age;Comorbidity 2    Comorbidities  left TKA 05/27/2019; right TKA 11/24/2018    Examination-Activity Limitations  Stand;Dressing;Hygiene/Grooming;Locomotion Level    Stability/Clinical Decision Making   Stable/Uncomplicated    Clinical Decision Making  Low    Rehab Potential  Excellent    PT Frequency  3x / week    PT Duration  6 weeks    PT Treatment/Interventions  ADLs/Self Care Home Management;Cryotherapy;Electrical Stimulation;Gait training;Stair training;Balance training;Therapeutic exercise;Neuromuscular re-education;Manual techniques;Passive range of motion;Patient/family education;Vasopneumatic Device;Moist Heat;Therapeutic activities;Functional mobility training    PT Next Visit Plan  Nustep, knee flexion AROM in supine, sitting and standing, hip strengthening, PROM to left knee, modalities PRN for pain relief.    PT Home Exercise Plan  see patient education section    Consulted and Agree with Plan of Care  Patient       Patient will benefit from skilled therapeutic intervention in order to improve the following deficits and impairments:  Abnormal gait, Decreased activity tolerance, Decreased balance, Decreased strength, Difficulty walking, Decreased range of motion, Pain, Increased edema  Visit Diagnosis: Acute pain of left knee  Muscle weakness (generalized)  Stiffness of left knee, not elsewhere classified     Problem List Patient Active Problem List   Diagnosis Date Noted  . Degenerative arthritis of left knee 05/26/2019  . Total knee replacement status, right 11/24/2018  . Osteoarthritis of right knee 11/20/2018  . Mild cognitive impairment 10/09/2016  . High blood pressure 09/15/2013  . Hypothyroidism 09/15/2013  . Obesity (BMI 30-39.9) 09/15/2013  . S/P arthroscopy of left knee 10/02/2011  . Lateral meniscal tear 01/18/2011    Gabriela Eves, PT, DPT 06/18/2019, 10:35 AM  [facility] [place], [place], [address] Phone: [phone]   Fax:  [phone]  Name: Lisa Parker MRN: [id number] Date of Birth: 1946/03/04

## 2019-06-22 ENCOUNTER — Other Ambulatory Visit: Payer: Self-pay

## 2019-06-22 ENCOUNTER — Ambulatory Visit: Payer: Medicare Other | Admitting: Physical Therapy

## 2019-06-22 DIAGNOSIS — M25562 Pain in left knee: Secondary | ICD-10-CM

## 2019-06-22 DIAGNOSIS — M6281 Muscle weakness (generalized): Secondary | ICD-10-CM | POA: Diagnosis not present

## 2019-06-22 DIAGNOSIS — M25662 Stiffness of left knee, not elsewhere classified: Secondary | ICD-10-CM

## 2019-06-22 NOTE — Therapy (Signed)
[facility] [place], [place], [address] Phone: [phone]   Fax:  [phone]  Physical Therapy Treatment  Patient Details  Name: Lisa Parker MRN: [id number] Date of Birth: 01-05-1946 Referring Provider (PT): Frederik Pear, MD   Encounter Date: 06/22/2019  PT End of Session - 06/22/19 0916    Visit Number  4    Number of Visits  18    Date for PT Re-Evaluation  07/29/19    Authorization Type  FOTO;  progress note every 10th visit, KX modifier at 15th visit    PT Start Time  0900    PT Stop Time  0952    PT Time Calculation (min)  52 min    Equipment Utilized During Treatment  Other (comment)   SPC   Activity Tolerance  Patient tolerated treatment well    Behavior During Therapy  [facility] for tasks assessed/performed       Past Medical History:  Diagnosis Date  . Anxiety   . Arthritis   . Asthma   . HTN (hypertension)    "i had a gastric sleeve done and lost 100 pounds and i dont have htn anymore, my doctor, Dr Hilma Favors took me off of it "   . Hypothyroid   . Memory loss   . Seasonal allergies   . Sinus bradycardia   . Urinary tract infection     Past Surgical History:  Procedure Laterality Date  . BLEPHAROPLASTY Bilateral   . CATARACT EXTRACTION, BILATERAL     with lens   . CESAREAN SECTION    . FINGER SURGERY     left pinky  . HERNIA REPAIR  2004   inguinal , incisional, navel   . KNEE SURGERY Left    arthroscopic   . LAPAROSCOPIC GASTRIC SLEEVE RESECTION    . MASS EXCISION Left 10/20/2015   Procedure: EXCISION MASS LEFT SMALL FINGER;  Surgeon: Leanora Cover, MD;  Location: [place];  Service: Orthopedics;  Laterality: Left;  . TONSILLECTOMY AND ADENOIDECTOMY    . TOTAL KNEE ARTHROPLASTY Right 11/24/2018   Procedure: Right Knee Arthroplasty;  Surgeon: Frederik Pear, MD;  [facility];  Service: Orthopedics;  Laterality: Right;  . TOTAL KNEE ARTHROPLASTY Left 05/27/2019   Procedure: LEFT  TOTAL KNEE ARTHROPLASTY SAMD DAY DISCHARGE;  Surgeon: Frederik Pear, MD;  Location[facility];  Service: Orthopedics;  Laterality: Left;    There were no vitals filed for this visit.  Subjective Assessment - 06/22/19 0947    Subjective  COVID-19 screening performed upon arrival. Patient arrives ambulating with a SPC, 3/10 discomfort in the left knee with ongoing swelling.    Pertinent History  left TKA 05/27/2019; right TKA 11/24/2018    Limitations  Sitting;Walking;Standing;House hold activities    Diagnostic tests  x-ray    Patient Stated Goals  walk without AD    Currently in Pain?  Yes    Pain Score  3     Pain Location  Knee    Pain Orientation  Left    Pain Descriptors / Indicators  Discomfort    Pain Type  Surgical pain    Pain Onset  1 to 4 weeks ago    Pain Frequency  Constant         [facility] PT Assessment - 06/22/19 0001      Assessment   Medical Diagnosis  S/P left total knee replacement    Referring Provider (PT)  Frederik Pear, MD  Onset Date/Surgical Date  05/27/19    Next MD Visit  "4 weeks"    Prior Therapy  not for left knee      Precautions   Precautions  Other (comment)    Precaution Comments  No Ultrasound      Restrictions   Weight Bearing Restrictions  No                   [facility] Adult PT Treatment/Exercise - 06/22/19 0001      Exercises   Exercises  Knee/Hip      Knee/Hip Exercises: Aerobic   Nustep  level 4 x15 mins seat 8 to 7      Knee/Hip Exercises: Standing   Heel Raises  Both;2 sets;10 reps    Heel Raises Limitations  Toe raises 2x10    Knee Flexion  AROM;Both;2 sets;10 reps    Hip Flexion  AROM;Both;2 sets;10 reps;Knee bent    Hip Abduction  AROM;Both;2 sets;10 reps      Knee/Hip Exercises: Seated   Sit to Sand  2 sets;10 reps;without UE support;with UE support      Knee/Hip Exercises: Supine   Short Arc Quad Sets  AROM;Left;3 sets;10 reps    Straight Leg Raises  AROM;Left;2 sets;5 reps      Modalities   Modalities   Psychologist, educational Location  left knee    Electrical Stimulation Action  IFC    Electrical Stimulation Parameters  80-150 hz x15 mins    Electrical Stimulation Goals  Pain      Vasopneumatic   Number Minutes Vasopneumatic   15 minutes    Vasopnuematic Location   Knee    Vasopneumatic Pressure  Low    Vasopneumatic Temperature   34                  PT Long Term Goals - 06/10/19 1109      PT LONG TERM GOAL #1   Title  Patient will be independent with HEP    Time  6    Period  Weeks    Status  New      PT LONG TERM GOAL #2   Title  Patient will demonstrate 120 degrees of left knee flexion AROM to improve ability to perform functional tasks.    Time  6    Period  Weeks    Status  New      PT LONG TERM GOAL #3   Title  Patient will demonstrate 4+/5 or greater left knee MMT in all planes to improve stability during functional tasks.    Time  6    Period  Weeks    Status  New      PT LONG TERM GOAL #4   Title  Patient will demonstrate reciprocating stair ambulation with one railing to safely enter and exit home.    Time  6    Period  Weeks    Status  Achieved      PT LONG TERM GOAL #5   Title  Patient will demonstrate 0 degrees of left knee extension AROM to improve gait mechanics.    Time  6    Period  Weeks    Status  New            Plan - 06/22/19 0944    Clinical Impression Statement  Patient responded well to therapy session thought patient reports a fear of falling without her  walker. Patient discussed utilizing an AD for safety to prevent a fall. Patient demonstrated good carryover of cuing for form and technique but patient still continues to demo slight extension lag with SLR due to fatigue. No adverse affects upon removal of modalities.    Personal Factors and Comorbidities  Age;Comorbidity 2    Comorbidities  left TKA 05/27/2019; right TKA 11/24/2018    Examination-Activity  Limitations  Stand;Dressing;Hygiene/Grooming;Locomotion Level    Stability/Clinical Decision Making  Stable/Uncomplicated    Rehab Potential  Excellent    PT Frequency  3x / week    PT Duration  6 weeks    PT Treatment/Interventions  ADLs/Self Care Home Management;Cryotherapy;Electrical Stimulation;Gait training;Stair training;Balance training;Therapeutic exercise;Neuromuscular re-education;Manual techniques;Passive range of motion;Patient/family education;Vasopneumatic Device;Moist Heat;Therapeutic activities;Functional mobility training    PT Next Visit Plan  Nustep, knee flexion AROM in supine, sitting and standing, hip strengthening, PROM to left knee, modalities PRN for pain relief.    Consulted and Agree with Plan of Care  Patient       Patient will benefit from skilled therapeutic intervention in order to improve the following deficits and impairments:  Abnormal gait, Decreased activity tolerance, Decreased balance, Decreased strength, Difficulty walking, Decreased range of motion, Pain, Increased edema  Visit Diagnosis: Acute pain of left knee  Muscle weakness (generalized)  Stiffness of left knee, not elsewhere classified     Problem List Patient Active Problem List   Diagnosis Date Noted  . Degenerative arthritis of left knee 05/26/2019  . Total knee replacement status, right 11/24/2018  . Osteoarthritis of right knee 11/20/2018  . Mild cognitive impairment 10/09/2016  . High blood pressure 09/15/2013  . Hypothyroidism 09/15/2013  . Obesity (BMI 30-39.9) 09/15/2013  . S/P arthroscopy of left knee 10/02/2011  . Lateral meniscal tear 01/18/2011    Gabriela Eves, PT, DPT 06/22/2019, 11:02 AM  [facility] [place], [place], [address] Phone: [phone]   Fax:  [phone]  Name: Lisa Parker MRN: [id number] Date of Birth: 1945-11-07

## 2019-06-24 ENCOUNTER — Other Ambulatory Visit: Payer: Self-pay

## 2019-06-24 ENCOUNTER — Encounter: Payer: Self-pay | Admitting: Physical Therapy

## 2019-06-24 ENCOUNTER — Ambulatory Visit: Payer: Medicare Other | Admitting: Physical Therapy

## 2019-06-24 DIAGNOSIS — M25562 Pain in left knee: Secondary | ICD-10-CM | POA: Diagnosis not present

## 2019-06-24 DIAGNOSIS — M6281 Muscle weakness (generalized): Secondary | ICD-10-CM | POA: Diagnosis not present

## 2019-06-24 DIAGNOSIS — M25662 Stiffness of left knee, not elsewhere classified: Secondary | ICD-10-CM

## 2019-06-24 NOTE — Therapy (Signed)
[facility] [place], [place], [address] Phone: [phone]   Fax:  [phone]  Physical Therapy Evaluation  Patient Details  Name: Lisa Parker MRN: [id number] Date of Birth: 01-08-1946 Referring Provider (PT): Frederik Pear, MD   Encounter Date: 06/24/2019  PT End of Session - 06/24/19 1052    Visit Number  5    Number of Visits  18    Date for PT Re-Evaluation  07/29/19    Authorization Type  FOTO;  progress note every 10th visit, KX modifier at 15th visit    PT Start Time  0900    PT Stop Time  0953    PT Time Calculation (min)  53 min    Activity Tolerance  Patient tolerated treatment well    Behavior During Therapy  [facility] for tasks assessed/performed       Past Medical History:  Diagnosis Date  . Anxiety   . Arthritis   . Asthma   . HTN (hypertension)    "i had a gastric sleeve done and lost 100 pounds and i dont have htn anymore, my doctor, Dr Hilma Favors took me off of it "   . Hypothyroid   . Memory loss   . Seasonal allergies   . Sinus bradycardia   . Urinary tract infection     Past Surgical History:  Procedure Laterality Date  . BLEPHAROPLASTY Bilateral   . CATARACT EXTRACTION, BILATERAL     with lens   . CESAREAN SECTION    . FINGER SURGERY     left pinky  . HERNIA REPAIR  2004   inguinal , incisional, navel   . KNEE SURGERY Left    arthroscopic   . LAPAROSCOPIC GASTRIC SLEEVE RESECTION    . MASS EXCISION Left 10/20/2015   Procedure: EXCISION MASS LEFT SMALL FINGER;  Surgeon: Leanora Cover, MD;  Location: [place];  Service: Orthopedics;  Laterality: Left;  . TONSILLECTOMY AND ADENOIDECTOMY    . TOTAL KNEE ARTHROPLASTY Right 11/24/2018   Procedure: Right Knee Arthroplasty;  Surgeon: Frederik Pear, MD;  [facility];  Service: Orthopedics;  Laterality: Right;  . TOTAL KNEE ARTHROPLASTY Left 05/27/2019   Procedure: LEFT TOTAL KNEE ARTHROPLASTY SAMD DAY DISCHARGE;  Surgeon: Frederik Pear, MD;  [facility];  Service: Orthopedics;  Laterality: Left;    There were no vitals filed for this visit.   Subjective Assessment - 06/24/19 1019    Subjective  COVID-19 screen performed prior to patient entering clinic.  I doing good.    Pertinent History  left TKA 05/27/2019; right TKA 11/24/2018    Limitations  Sitting;Walking;Standing;House hold activities    Diagnostic tests  x-ray    Patient Stated Goals  walk without AD    Currently in Pain?  Yes    Pain Score  3     Pain Location  Knee    Pain Orientation  Left    Pain Descriptors / Indicators  Discomfort    Pain Type  Surgical pain    Pain Onset  1 to 4 weeks ago         [facility] PT Assessment - 06/24/19 0001      AROM   Overall AROM Comments  Left knee active flexion= 105 degrees.                Objective measurements completed on examination: See above findings.      [facility] Adult PT Treatment/Exercise - 06/24/19 0001  Exercises   Exercises  Knee/Hip      Knee/Hip Exercises: Aerobic   Nustep  Level x 16 minutes.      Acupuncturist Location  Left knee.    Electrical Stimulation Action  IFC    Electrical Stimulation Parameters  80-150 Hz x 20 minutes.    Electrical Stimulation Goals  Pain      Vasopneumatic   Number Minutes Vasopneumatic   20 minutes    Vasopnuematic Location   --   Left knee.   Vasopneumatic Pressure  Low      Manual Therapy   Passive ROM  Gentle PROM to patient's left knee x 7 minutes.                  PT Long Term Goals - 06/10/19 1109      PT LONG TERM GOAL #1   Title  Patient will be independent with HEP    Time  6    Period  Weeks    Status  New      PT LONG TERM GOAL #2   Title  Patient will demonstrate 120 degrees of left knee flexion AROM to improve ability to perform functional tasks.    Time  6    Period  Weeks    Status  New      PT LONG TERM GOAL #3   Title  Patient will demonstrate 4+/5 or  greater left knee MMT in all planes to improve stability during functional tasks.    Time  6    Period  Weeks    Status  New      PT LONG TERM GOAL #4   Title  Patient will demonstrate reciprocating stair ambulation with one railing to safely enter and exit home.    Time  6    Period  Weeks    Status  Achieved      PT LONG TERM GOAL #5   Title  Patient will demonstrate 0 degrees of left knee extension AROM to improve gait mechanics.    Time  6    Period  Weeks    Status  New             Plan - 06/24/19 1120    Clinical Impression Statement  The patient did very well today with 105 degrees of active left knee flexion after stretching today.    Personal Factors and Comorbidities  Age;Comorbidity 2    Comorbidities  left TKA 05/27/2019; right TKA 11/24/2018    Examination-Activity Limitations  Stand;Dressing;Hygiene/Grooming;Locomotion Level    Stability/Clinical Decision Making  Stable/Uncomplicated    Rehab Potential  Excellent    PT Frequency  3x / week    PT Duration  6 weeks    PT Treatment/Interventions  ADLs/Self Care Home Management;Cryotherapy;Electrical Stimulation;Gait training;Stair training;Balance training;Therapeutic exercise;Neuromuscular re-education;Manual techniques;Passive range of motion;Patient/family education;Vasopneumatic Device;Moist Heat;Therapeutic activities;Functional mobility training    PT Next Visit Plan  Nustep, knee flexion AROM in supine, sitting and standing, hip strengthening, PROM to left knee, modalities PRN for pain relief.    PT Home Exercise Plan  see patient education section    Consulted and Agree with Plan of Care  Patient       Patient will benefit from skilled therapeutic intervention in order to improve the following deficits and impairments:  Abnormal gait, Decreased activity tolerance, Decreased balance, Decreased strength, Difficulty walking, Decreased range of motion, Pain, Increased edema  Visit Diagnosis: Acute pain of  left  knee  Muscle weakness (generalized)  Stiffness of left knee, not elsewhere classified     Problem List Patient Active Problem List   Diagnosis Date Noted  . Degenerative arthritis of left knee 05/26/2019  . Total knee replacement status, right 11/24/2018  . Osteoarthritis of right knee 11/20/2018  . Mild cognitive impairment 10/09/2016  . High blood pressure 09/15/2013  . Hypothyroidism 09/15/2013  . Obesity (BMI 30-39.9) 09/15/2013  . S/P arthroscopy of left knee 10/02/2011  . Lateral meniscal tear 01/18/2011    Lisa Parker, Lisa Parker MPT 06/24/2019, 11:25 AM  [facility] [address] [place], [place], [address] Phone: [phone]   Fax:  [phone]  Name: Lisa Parker MRN: [id number] Date of Birth: March 22, 1946

## 2019-06-25 ENCOUNTER — Encounter: Payer: Medicare Other | Admitting: Physical Therapy

## 2019-06-29 ENCOUNTER — Ambulatory Visit: Payer: Medicare Other | Admitting: *Deleted

## 2019-06-29 ENCOUNTER — Other Ambulatory Visit: Payer: Self-pay

## 2019-06-29 DIAGNOSIS — M25662 Stiffness of left knee, not elsewhere classified: Secondary | ICD-10-CM

## 2019-06-29 DIAGNOSIS — M25562 Pain in left knee: Secondary | ICD-10-CM | POA: Diagnosis not present

## 2019-06-29 DIAGNOSIS — M6281 Muscle weakness (generalized): Secondary | ICD-10-CM | POA: Diagnosis not present

## 2019-06-29 NOTE — Therapy (Signed)
[facility] [place], [place], [address] Phone: [phone]   Fax:  [phone]  Physical Therapy Treatment  Patient Details  Name: Lisa Parker MRN: [id number] Date of Birth: [birth year] Referring Provider (PT): Frederik Pear, MD   Encounter Date: 06/29/2019  PT End of Session - 06/29/19 0913    Visit Number  6    Number of Visits  18    Date for PT Re-Evaluation  07/29/19    Authorization Type  FOTO;  progress note every 10th visit, KX modifier at 15th visit    PT Start Time  0900    PT Stop Time  0955    PT Time Calculation (min)  55 min       Past Medical History:  Diagnosis Date  . Anxiety   . Arthritis   . Asthma   . HTN (hypertension)    "i had a gastric sleeve done and lost 100 pounds and i dont have htn anymore, my doctor, Dr Hilma Favors took me off of it "   . Hypothyroid   . Memory loss   . Seasonal allergies   . Sinus bradycardia   . Urinary tract infection     Past Surgical History:  Procedure Laterality Date  . BLEPHAROPLASTY Bilateral   . CATARACT EXTRACTION, BILATERAL     with lens   . CESAREAN SECTION    . FINGER SURGERY     left pinky  . HERNIA REPAIR  2004   inguinal , incisional, navel   . KNEE SURGERY Left    arthroscopic   . LAPAROSCOPIC GASTRIC SLEEVE RESECTION    . MASS EXCISION Left 10/20/2015   Procedure: EXCISION MASS LEFT SMALL FINGER;  Surgeon: Leanora Cover, MD;  Location: [facility];  Service: Orthopedics;  Laterality: Left;  . TONSILLECTOMY AND ADENOIDECTOMY    . TOTAL KNEE ARTHROPLASTY Right 11/24/2018   Procedure: Right Knee Arthroplasty;  Surgeon: Frederik Pear, MD;  Location: [facility];  Service: Orthopedics;  Laterality: Right;  . TOTAL KNEE ARTHROPLASTY Left 05/27/2019   Procedure: LEFT TOTAL KNEE ARTHROPLASTY SAMD DAY DISCHARGE;  Surgeon: Frederik Pear, MD;  Location: [facility];  Service: Orthopedics;  Laterality: Left;    There were no vitals filed for this  visit.  Subjective Assessment - 06/29/19 0911    Subjective  COVID-19 screen performed prior to patient entering clinic.  I doing good. 2/10 today sore    Pertinent History  left TKA 05/27/2019; right TKA 11/24/2018    Limitations  Sitting;Walking;Standing;House hold activities    Patient Stated Goals  walk without AD    Currently in Pain?  Yes    Pain Score  2     Pain Location  Knee    Pain Orientation  Left    Pain Descriptors / Indicators  Sore    Pain Onset  1 to 4 weeks ago                       [facility] Adult PT Treatment/Exercise - 06/29/19 0001      Exercises   Exercises  Knee/Hip      Knee/Hip Exercises: Aerobic   Nustep  Level x 16 minutes.      Knee/Hip Exercises: Standing   Rocker Board  3 minutes   calf stretching     Modalities   Modalities  Electrical Stimulation;Vasopneumatic      Electrical Stimulation   Electrical Stimulation Location  Left knee.  Chartered certified accountant  IFC    Electrical Stimulation Parameters  80-150hz     Electrical Stimulation Goals  Pain      Vasopneumatic   Number Minutes Vasopneumatic   20 minutes    Vasopnuematic Location   Knee    Vasopneumatic Pressure  Low    Vasopneumatic Temperature   34      Manual Therapy   Passive ROM  Gentle PROM to patient's left knee for flexion f/b scar mass/mobs distal aspect                  PT Long Term Goals - 06/10/19 1109      PT LONG TERM GOAL #1   Title  Patient will be independent with HEP    Time  6    Period  Weeks    Status  New      PT LONG TERM GOAL #2   Title  Patient will demonstrate 120 degrees of left knee flexion AROM to improve ability to perform functional tasks.    Time  6    Period  Weeks    Status  New      PT LONG TERM GOAL #3   Title  Patient will demonstrate 4+/5 or greater left knee MMT in all planes to improve stability during functional tasks.    Time  6    Period  Weeks    Status  New      PT LONG TERM GOAL #4   Title   Patient will demonstrate reciprocating stair ambulation with one railing to safely enter and exit home.    Time  6    Period  Weeks    Status  Achieved      PT LONG TERM GOAL #5   Title  Patient will demonstrate 0 degrees of left knee extension AROM to improve gait mechanics.    Time  6    Period  Weeks    Status  New            Plan - 06/29/19 0950    Clinical Impression Statement  Pt arrived today doing fairly well with 2/10 soreness LT knee. She was guided through therex f/b PROM and scar massage and did well. Still with notable tightness distal incision. Good end-feel for flexion PROM Normal modality response.    Personal Factors and Comorbidities  Age;Comorbidity 2    Comorbidities  left TKA 05/27/2019; right TKA 11/24/2018    Examination-Activity Limitations  Stand;Dressing;Hygiene/Grooming;Locomotion Level    Stability/Clinical Decision Making  Stable/Uncomplicated    Rehab Potential  Excellent    PT Frequency  3x / week    PT Duration  6 weeks    PT Treatment/Interventions  ADLs/Self Care Home Management;Cryotherapy;Electrical Stimulation;Gait training;Stair training;Balance training;Therapeutic exercise;Neuromuscular re-education;Manual techniques;Passive range of motion;Patient/family education;Vasopneumatic Device;Moist Heat;Therapeutic activities;Functional mobility training    PT Next Visit Plan  Nustep, knee flexion AROM in supine, sitting and standing, hip strengthening, PROM to left knee, modalities PRN for pain relief.    Consulted and Agree with Plan of Care  Patient       Patient will benefit from skilled therapeutic intervention in order to improve the following deficits and impairments:  Abnormal gait, Decreased activity tolerance, Decreased balance, Decreased strength, Difficulty walking, Decreased range of motion, Pain, Increased edema  Visit Diagnosis: Muscle weakness (generalized)  Acute pain of left knee  Stiffness of left knee, not elsewhere  classified     Problem List Patient Active Problem List  Diagnosis Date Noted  . Degenerative arthritis of left knee 05/26/2019  . Total knee replacement status, right 11/24/2018  . Osteoarthritis of right knee 11/20/2018  . Mild cognitive impairment 10/09/2016  . High blood pressure 09/15/2013  . Hypothyroidism 09/15/2013  . Obesity (BMI 30-39.9) 09/15/2013  . S/P arthroscopy of left knee 10/02/2011  . Lateral meniscal tear 01/18/2011    Keigan Girten,CHRIS, PTA 06/29/2019, 10:06 AM  [facility] [place], [place], [address] Phone: [phone]   Fax:  [phone]  Name: Lisa Parker MRN: [id number] Date of Birth: 1945-06-13

## 2019-07-01 ENCOUNTER — Ambulatory Visit: Payer: Medicare Other | Admitting: Physical Therapy

## 2019-07-01 ENCOUNTER — Encounter: Payer: Self-pay | Admitting: Physical Therapy

## 2019-07-01 ENCOUNTER — Other Ambulatory Visit: Payer: Self-pay

## 2019-07-01 DIAGNOSIS — M25662 Stiffness of left knee, not elsewhere classified: Secondary | ICD-10-CM

## 2019-07-01 DIAGNOSIS — M25562 Pain in left knee: Secondary | ICD-10-CM | POA: Diagnosis not present

## 2019-07-01 DIAGNOSIS — M6281 Muscle weakness (generalized): Secondary | ICD-10-CM

## 2019-07-01 NOTE — Therapy (Signed)
[facility] [place], [place], [address] Phone: [phone]   Fax:  [phone]  Physical Therapy Treatment  Patient Details  Name: Lisa Parker MRN: [id number] Date of Birth: 1946/01/07 Referring Provider (PT): Frederik Pear, MD   Encounter Date: 07/01/2019  PT End of Session - 07/01/19 0939    Visit Number  7    Number of Visits  18    Date for PT Re-Evaluation  07/29/19    Authorization Type  FOTO;  progress note every 10th visit, KX modifier at 15th visit    PT Start Time  0901    PT Stop Time  0953    PT Time Calculation (min)  52 min    Activity Tolerance  Patient tolerated treatment well    Behavior During Therapy  [facility] for tasks assessed/performed       Past Medical History:  Diagnosis Date  . Anxiety   . Arthritis   . Asthma   . HTN (hypertension)    "i had a gastric sleeve done and lost 100 pounds and i dont have htn anymore, my doctor, Dr Hilma Favors took me off of it "   . Hypothyroid   . Memory loss   . Seasonal allergies   . Sinus bradycardia   . Urinary tract infection     Past Surgical History:  Procedure Laterality Date  . BLEPHAROPLASTY Bilateral   . CATARACT EXTRACTION, BILATERAL     with lens   . CESAREAN SECTION    . FINGER SURGERY     left pinky  . HERNIA REPAIR  2004   inguinal , incisional, navel   . KNEE SURGERY Left    arthroscopic   . LAPAROSCOPIC GASTRIC SLEEVE RESECTION    . MASS EXCISION Left 10/20/2015   Procedure: EXCISION MASS LEFT SMALL FINGER;  Surgeon: Leanora Cover, MD;  Location: [facility];  Service: Orthopedics;  Laterality: Left;  . TONSILLECTOMY AND ADENOIDECTOMY    . TOTAL KNEE ARTHROPLASTY Right 11/24/2018   Procedure: Right Knee Arthroplasty;  Surgeon: Frederik Pear, MD;  Location: [facility];  Service: Orthopedics;  Laterality: Right;  . TOTAL KNEE ARTHROPLASTY Left 05/27/2019   Procedure: LEFT TOTAL KNEE ARTHROPLASTY SAMD DAY DISCHARGE;  Surgeon: Frederik Pear, MD;  Location: [facility];  Service: Orthopedics;  Laterality: Left;    There were no vitals filed for this visit.  Subjective Assessment - 07/01/19 0938    Subjective  COVID-19 screen performed prior to patient entering clinic. Reports she can now extend L knee completely straight even with a shoe on. Reports she had increased pain last night but used OTC pain meds and ice to control.    Pertinent History  left TKA 05/27/2019; right TKA 11/24/2018    Limitations  Sitting;Walking;Standing;House hold activities    Diagnostic tests  x-ray    Patient Stated Goals  walk without AD    Currently in Pain?  No/denies         [facility] PT Assessment - 07/01/19 0001      Assessment   Medical Diagnosis  S/P left total knee replacement    Referring Provider (PT)  Frederik Pear, MD    Onset Date/Surgical Date  05/27/19    Next MD Visit  07/07/2019    Prior Therapy  not for left knee      Precautions   Precautions  Other (comment)    Precaution Comments  No Ultrasound      Restrictions  Weight Bearing Restrictions  No      ROM / Strength   AROM / PROM / Strength  AROM;PROM      AROM   Overall AROM   Within functional limits for tasks performed    AROM Assessment Site  Knee    Right/Left Knee  Left    Left Knee Extension  0    Left Knee Flexion  115      PROM   Overall PROM   Within functional limits for tasks performed    PROM Assessment Site  Knee    Right/Left Knee  Left    Left Knee Flexion  120                   [facility] Adult PT Treatment/Exercise - 07/01/19 0001      Knee/Hip Exercises: Aerobic   Nustep  L4 x16 min      Knee/Hip Exercises: Standing   Hip Abduction  AROM;Left;2 sets;10 reps;Knee straight    Forward Step Up  Left;15 reps;Hand Hold: 2;Step Height: 6"    Rocker Board  3 minutes      Knee/Hip Exercises: Supine   Straight Leg Raises  AROM;Left;15 reps      Modalities   Modalities  Vasopneumatic      Vasopneumatic   Number Minutes Vasopneumatic    15 minutes    Vasopnuematic Location   Knee    Vasopneumatic Pressure  Medium    Vasopneumatic Temperature   34      Manual Therapy   Manual Therapy  Passive ROM    Passive ROM  PROM of L knee into flexion with holds at end range                  PT Long Term Goals - 07/01/19 0953      PT LONG TERM GOAL #1   Title  Patient will be independent with HEP    Time  6    Period  Weeks    Status  On-going      PT LONG TERM GOAL #2   Title  Patient will demonstrate 120 degrees of left knee flexion AROM to improve ability to perform functional tasks.    Time  6    Period  Weeks    Status  Partially Met      PT LONG TERM GOAL #3   Title  Patient will demonstrate 4+/5 or greater left knee MMT in all planes to improve stability during functional tasks.    Time  6    Period  Weeks    Status  On-going      PT LONG TERM GOAL #4   Title  Patient will demonstrate reciprocating stair ambulation with one railing to safely enter and exit home.    Time  6    Period  Weeks    Status  Achieved      PT LONG TERM GOAL #5   Title  Patient will demonstrate 0 degrees of left knee extension AROM to improve gait mechanics.    Time  6    Period  Weeks    Status  Achieved            Plan - 07/01/19 1136    Clinical Impression Statement  Patient presented in clinic with no reports of limitation with ADLs. Patient able to complete step ups with no compensatory deviations. Patient able to achieve 115 deg L knee flexion AROM, 120 deg PROM. Full extension  measured in supine today as well. Normal vasopnuematic response noted following removal of the modality.    Personal Factors and Comorbidities  Age;Comorbidity 2    Comorbidities  left TKA 05/27/2019; right TKA 11/24/2018    Examination-Activity Limitations  Stand;Dressing;Hygiene/Grooming;Locomotion Level    Stability/Clinical Decision Making  Stable/Uncomplicated    Rehab Potential  Excellent    PT Frequency  3x / week    PT Duration   6 weeks    PT Treatment/Interventions  ADLs/Self Care Home Management;Cryotherapy;Electrical Stimulation;Gait training;Stair training;Balance training;Therapeutic exercise;Neuromuscular re-education;Manual techniques;Passive range of motion;Patient/family education;Vasopneumatic Device;Moist Heat;Therapeutic activities;Functional mobility training    PT Next Visit Plan  Nustep, knee flexion AROM in supine, sitting and standing, hip strengthening, PROM to left knee, modalities PRN for pain relief.    PT Home Exercise Plan  see patient education section    Consulted and Agree with Plan of Care  Patient       Patient will benefit from skilled therapeutic intervention in order to improve the following deficits and impairments:  Abnormal gait, Decreased activity tolerance, Decreased balance, Decreased strength, Difficulty walking, Decreased range of motion, Pain, Increased edema  Visit Diagnosis: Muscle weakness (generalized)  Acute pain of left knee  Stiffness of left knee, not elsewhere classified     Problem List Patient Active Problem List   Diagnosis Date Noted  . Degenerative arthritis of left knee 05/26/2019  . Total knee replacement status, right 11/24/2018  . Osteoarthritis of right knee 11/20/2018  . Mild cognitive impairment 10/09/2016  . High blood pressure 09/15/2013  . Hypothyroidism 09/15/2013  . Obesity (BMI 30-39.9) 09/15/2013  . S/P arthroscopy of left knee 10/02/2011  . Lateral meniscal tear 01/18/2011    Standley Brooking, PTA 07/01/2019, 11:42 AM  [facility] [address] [place], [place], [address] Phone: [phone]   Fax:  [phone]  Name: Lisa Parker MRN: [id number] Date of Birth: 14-Jan-1946

## 2019-07-05 DIAGNOSIS — E039 Hypothyroidism, unspecified: Secondary | ICD-10-CM | POA: Diagnosis not present

## 2019-07-06 ENCOUNTER — Other Ambulatory Visit: Payer: Self-pay

## 2019-07-06 ENCOUNTER — Encounter: Payer: Self-pay | Admitting: Neurology

## 2019-07-06 ENCOUNTER — Ambulatory Visit: Payer: Medicare Other | Admitting: Neurology

## 2019-07-06 VITALS — BP 137/77 | HR 67 | Temp 96.0°F | Ht 66.0 in | Wt 170.0 lb

## 2019-07-06 DIAGNOSIS — G3184 Mild cognitive impairment, so stated: Secondary | ICD-10-CM | POA: Diagnosis not present

## 2019-07-06 DIAGNOSIS — R269 Unspecified abnormalities of gait and mobility: Secondary | ICD-10-CM | POA: Diagnosis not present

## 2019-07-06 MED ORDER — MEMANTINE HCL 10 MG PO TABS: ORAL_TABLET | ORAL | 3 refills | Status: DC

## 2019-07-06 MED ORDER — DONEPEZIL HCL 10 MG PO TABS: 10.0000 mg | ORAL_TABLET | Freq: Every day | ORAL | 4 refills | Status: DC

## 2019-07-06 NOTE — Progress Notes (Signed)
PATIENT: Lisa Parker DOB: 10/11/1945  Chief Complaint  Patient presents with  . Mild Memory Loss    MMSE 30/30 - 25 animals. She is here with her husband, Lisa Parker. Feels her memory is stable but she struggles with forgetfulness.      HISTORICAL  Lisa Parker is a 74 years old right-handed female, seen in refer by her primary care doctor  Lisa Parker for evaluation of memory loss, initial evaluation was on October 09 2016.   I reviewed and summarized the referring note,she has history of hypothyroidism, on supplement, anxiety, she was recently changed from citalopram 40 mg to current escitalopram 20 mg since early May 2018, for worsening anxiety symptoms.  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  Mother had gradual onset memory loss in her eighties, now at age 45, she suffered significant memory loss, cognitive malfunction, to the point of needing help feeding, no longer ambulatory. Her maternal aunt died of Alzheimer's disease at age 5, her younger brother at age 21 also suffered some memory loss.  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 Her husband was recently diagnosed with Parkinson's disease.  I reviewed laboratory evaluation from [facility] in October 2017, elevated ferritin 388, B12 was 600, normal CBC, hemoglobin of 14.2,  UPDATE December 13 2016: She is accompanied by her husband at today's clinical visit, her anxiety is under better control with Lexapro 20 mg daily, new mental status examination he is 30/30 today, animal naming is 13, normal  clock drawing  We have personally reviewed MRI of the brain without contrast in June 2018, mild generalized atrophy  UPDATE May 28 2017: She is overall doing very well, is able to keep up with her busy daily routine, there is no significant memory loss, tolerating Namenda 10 mg twice a day, Aricept 10 mg daily  UPDATE May 29 2018: She is doing much better, 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 Mold Extract  [Trichophyton]     Other reaction(s): Wheezing (ALLERGY/intolerance)  . Pollen Extract     Other reaction(s): Wheezing (ALLERGY/intolerance)  . Cucumber Extract Swelling  . Other     Melon and Walnuts - swelling    HOME MEDICATIONS: Current Outpatient Medications  Medication Sig Dispense Refill  . ALPRAZolam (XANAX) 0.5 MG tablet Take 0.5 mg by mouth at bedtime.    Marland Kitchen ascorbic acid (VITAMIN C) 500 MG tablet Take 500 mg by mouth daily.    . budesonide-formoterol (SYMBICORT) 160-4.5 MCG/ACT inhaler Inhale 2 puffs into the lungs 2 (two) times daily as needed (shortness of breath).    Marland Kitchen buPROPion (WELLBUTRIN XL) 150 MG 24 hr tablet Take  150 mg by mouth daily.    . Calcium Carb-Cholecalciferol (CALCIUM 600 + D PO) Take 1 tablet by mouth daily.    . Cholecalciferol (VITAMIN D) 2000 UNITS tablet Take 2,000 Units by mouth daily.    . Cholecalciferol (VITAMIN D) 50 MCG (2000 UT) tablet Take 2,000 Units by mouth daily.    . diclofenac Sodium (VOLTAREN) 1 % GEL Apply 1 application topically 4 (four) times daily as needed (pain).    Marland Kitchen donepezil (ARICEPT) 10 MG tablet Take 1 tablet (10 mg total)  by mouth at bedtime. 90 tablet 4  . escitalopram (LEXAPRO) 20 MG tablet Take 20 mg by mouth daily.     . furosemide (LASIX) 20 MG tablet Take 20 mg by mouth daily.    Marland Kitchen levothyroxine (SYNTHROID) 150 MCG tablet Take 150 mcg by mouth daily before breakfast.    . memantine (NAMENDA) 10 MG tablet TAKE  (1) TABLET TWICE A DAY. (Patient taking differently: Take 10 mg by mouth 2 (two) times daily. ) 180 tablet 3  . Multiple Vitamin (MULTIVITAMIN WITH MINERALS) TABS tablet Take 1 tablet by mouth daily.    . Omega-3 Fatty Acids (OMEGA 3 500) 500 MG CAPS Take 500 mg by mouth daily.    Marland Kitchen oxybutynin (DITROPAN XL) 15 MG 24 hr tablet Take 15 mg by mouth daily.    . SUPER B COMPLEX/C PO Take 2 tablets by mouth daily.     No current facility-administered medications for this visit.    PAST MEDICAL HISTORY: Past Medical History:  Diagnosis Date  . Anxiety   . Arthritis   . Asthma   . HTN (hypertension)    "i had a gastric sleeve done and lost 100 pounds and i dont have htn anymore, my doctor, Dr Lisa Parker took me off of it "   . Hypothyroid   . Memory loss   . Seasonal allergies   . Sinus bradycardia   . Urinary tract infection     PAST SURGICAL HISTORY: Past Surgical History:  Procedure Laterality Date  . BLEPHAROPLASTY Bilateral   . CATARACT EXTRACTION, BILATERAL     with lens   . CESAREAN SECTION    . FINGER SURGERY     left pinky  . HERNIA REPAIR  2004   inguinal , incisional, navel   . KNEE SURGERY Left    arthroscopic   . LAPAROSCOPIC GASTRIC SLEEVE RESECTION    . MASS EXCISION Left 10/20/2015   Procedure: EXCISION MASS LEFT SMALL FINGER;  Surgeon: Lisa Cover, MD;  Location: [place];  Service: Orthopedics;  Laterality: Left;  . TONSILLECTOMY AND ADENOIDECTOMY    . TOTAL KNEE ARTHROPLASTY Right 11/24/2018   Procedure: Right Knee Arthroplasty;  Surgeon: Lisa Pear, MD;  Location: [facility];  Service: Orthopedics;  Laterality: Right;  . TOTAL KNEE ARTHROPLASTY Left  05/27/2019   Procedure: LEFT TOTAL KNEE ARTHROPLASTY SAMD DAY DISCHARGE;  Surgeon: Lisa Pear, MD;  Location: [facility];  Service: Orthopedics;  Laterality: Left;    FAMILY HISTORY: Family History  Problem Relation Age of Onset  . Alzheimer's disease Mother   . Lung cancer Father   . Heart disease Other   . Asthma Other   . Alzheimer's disease Maternal Aunt     SOCIAL HISTORY:  Social History   Socioeconomic History  . Marital status: Married    Spouse name: Not on file  . Number of children: 1  . Years of education: some college  . Highest education level:  Not on file  Occupational History  . Occupation: farm work - retired  Immunologist  . Smoking status: Former Smoker    Packs/day: 0.00    Years: 0.00    Pack years: 0.00    Types: Cigarettes    Start date: 27  . Smokeless tobacco: Never Used  Substance and Sexual Activity  . Alcohol use: No  . Drug use: No  . Sexual activity: Not on file  Other Topics Concern  . Not on file  Social History Narrative   Lives at home with husband.   Right-handed.   No caffeine use.   Social Determinants of Health   Financial Resource Strain:   . Difficulty of Paying Living Expenses: Not on file  Food Insecurity:   . Worried About Charity fundraiser in the Last Year: Not on file  . Ran Out of Food in the Last Year: Not on file  Transportation Needs:   . Lack of Transportation (Medical): Not on file  . Lack of Transportation (Non-Medical): Not on file  Physical Activity:   . Days of Exercise per Week: Not on file  . Minutes of Exercise per Session: Not on file  Stress:   . Feeling of Stress : Not on file  Social Connections:   . Frequency of Communication with Friends and Family: Not on file  . Frequency of Social Gatherings with Friends and Family: Not on file  . Attends Religious Services: Not on file  . Active Member of Clubs or Organizations: Not on file  . Attends Archivist Meetings: Not on file  .  Marital Status: Not on file  Intimate Partner Violence:   . Fear of Current or Ex-Partner: Not on file  . Emotionally Abused: Not on file  . Physically Abused: Not on file  . Sexually Abused: Not on file     PHYSICAL EXAM   Vitals:   07/06/19 0810  BP: 137/77  Pulse: 67  Temp: (!) 96 F (35.6 C)  Weight: 170 lb (77.1 kg)  Height: 5\' 6"  (1.676 m)    Not recorded      Body mass index is 27.44 kg/m.  PHYSICAL EXAMNIATION:  Gen: NAD, conversant, well nourised,well groomed                     Cardiovascular: Regular rate rhythm, no peripheral edema, warm, nontender. Eyes: Conjunctivae clear without exudates or hemorrhage Neck: Supple, no carotid bruits. Pulmonary: Clear to auscultation bilaterally   NEUROLOGICAL EXAM:  MENTAL STATUS: MMSE - Mini Mental State Exam 07/06/2019 05/29/2018 05/28/2017  Orientation to time 5 5 5   Orientation to Place 5 5 5   Registration 3 3 3   Attention/ Calculation 5 5 5   Recall 3 3 3   Language- name 2 objects 2 2 2   Language- repeat 1 1 1   Language- follow 3 step command 3 3 3   Language- read & follow direction 1 1 1   Write a sentence 1 1 1   Copy design 1 1 1   Total score 30 30 30   animal naming 25    CRANIAL NERVES: CN II: Visual fields are full to confrontation. Pupils are round equal and briskly reactive to light. CN III, IV, VI: extraocular movement are normal. No ptosis. CN V: Facial sensation is intact to light touch CN VII: Face is symmetric with normal eye closure and smile. CN VIII: Hearing is normal to casual conversation CN IX, X: Phonation is normal. CN XI: Head turning  and shoulder shrug are intact  MOTOR: There is no pronator drift of out-stretched arms. Muscle bulk and tone are normal. Muscle strength is normal.  REFLEXES: Reflexes are 1 and symmetric at the biceps, triceps, knees, and ankles. Plantar responses are flexor.  SENSORY: Intact to light touch,   COORDINATION: Rapid alternating movements and fine  finger movements are intact. There is no dysmetria on finger-to-nose and heel-knee-shin.    GAIT/STANCE: She needs push-up to get at from seated position, valgus knee, mildly unsteady, scoliosis   DIAGNOSTIC DATA (LABS, IMAGING, TESTING) - I reviewed patient records, labs, notes, testing and imaging myself where available.   ASSESSMENT AND PLAN  Kirby Brull Mclaren is a 74 y.o. female    Mild cognitive impairment  Strong family history of Alzheimer's disease  MRI of the brain without contrast showed mild generalized atrophy  Previous laboratory evaluation showed no treatable etiology,  Continue Namenda 10 mg twice a day, Aricept 10 mg daily  Continue moderate exercise    Marcial Pacas, M.D. Ph.D.  [facility]  [address], [place], [place] [address] Ph: [phone] Fax: [phone]  [id number], Jenny Reichmann, MD

## 2019-07-08 ENCOUNTER — Encounter: Payer: Self-pay | Admitting: Physical Therapy

## 2019-07-08 ENCOUNTER — Ambulatory Visit: Payer: Medicare Other | Attending: Orthopedic Surgery | Admitting: Physical Therapy

## 2019-07-08 ENCOUNTER — Other Ambulatory Visit: Payer: Self-pay

## 2019-07-08 DIAGNOSIS — M6281 Muscle weakness (generalized): Secondary | ICD-10-CM

## 2019-07-08 DIAGNOSIS — M25662 Stiffness of left knee, not elsewhere classified: Secondary | ICD-10-CM | POA: Diagnosis not present

## 2019-07-08 DIAGNOSIS — M25561 Pain in right knee: Secondary | ICD-10-CM | POA: Insufficient documentation

## 2019-07-08 DIAGNOSIS — M25562 Pain in left knee: Secondary | ICD-10-CM | POA: Insufficient documentation

## 2019-07-08 NOTE — Therapy (Signed)
[facility] [place], [place], [address] Phone: [phone]   Fax:  [phone]  Physical Therapy Treatment  Patient Details  Name: Lisa Parker MRN: [id number] Date of Birth: Aug 10, 1945 Referring Provider (PT): Frederik Pear, MD   Encounter Date: 07/08/2019  PT End of Session - 07/08/19 0932    Visit Number  8    Number of Visits  18    Date for PT Re-Evaluation  07/29/19    Authorization Type  FOTO;  progress note every 10th visit, KX modifier at 15th visit    PT Start Time  0909    PT Stop Time  1000    PT Time Calculation (min)  51 min    Activity Tolerance  Patient tolerated treatment well    Behavior During Therapy  [facility] for tasks assessed/performed       Past Medical History:  Diagnosis Date  . Anxiety   . Arthritis   . Asthma   . HTN (hypertension)    "i had a gastric sleeve done and lost 100 pounds and i dont have htn anymore, my doctor, Dr Hilma Favors took me off of it "   . Hypothyroid   . Memory loss   . Seasonal allergies   . Sinus bradycardia   . Urinary tract infection     Past Surgical History:  Procedure Laterality Date  . BLEPHAROPLASTY Bilateral   . CATARACT EXTRACTION, BILATERAL     with lens   . CESAREAN SECTION    . FINGER SURGERY     left pinky  . HERNIA REPAIR  2004   inguinal , incisional, navel   . KNEE SURGERY Left    arthroscopic   . LAPAROSCOPIC GASTRIC SLEEVE RESECTION    . MASS EXCISION Left 10/20/2015   Procedure: EXCISION MASS LEFT SMALL FINGER;  Surgeon: Leanora Cover, MD;  Location: [place];  Service: Orthopedics;  Laterality: Left;  . TONSILLECTOMY AND ADENOIDECTOMY    . TOTAL KNEE ARTHROPLASTY Right 11/24/2018   Procedure: Right Knee Arthroplasty;  Surgeon: Frederik Pear, MD;  Location: [facility];  Service: Orthopedics;  Laterality: Right;  . TOTAL KNEE ARTHROPLASTY Left 05/27/2019   Procedure: LEFT TOTAL KNEE ARTHROPLASTY SAMD DAY DISCHARGE;  Surgeon: Frederik Pear, MD;  Location[facility];  Service: Orthopedics;  Laterality: Left;    There were no vitals filed for this visit.  Subjective Assessment - 07/08/19 0909    Subjective  COVID 19 screening performed on patient upon arrival. Patient reports she woke very stiff this morning.    Pertinent History  left TKA 05/27/2019; right TKA 11/24/2018    Limitations  Sitting;Walking;Standing;House hold activities    Diagnostic tests  x-ray    Patient Stated Goals  walk without AD    Currently in Pain?  No/denies         [facility] PT Assessment - 07/08/19 0001      Assessment   Medical Diagnosis  S/P left total knee replacement    Referring Provider (PT)  Frederik Pear, MD    Onset Date/Surgical Date  05/27/19    Next MD Visit  07/07/2019    Prior Therapy  not for left knee      Precautions   Precautions  Other (comment)    Precaution Comments  No Ultrasound      Restrictions   Weight Bearing Restrictions  No  [facility] PT Treatment/Exercise - 07/08/19 0001      Knee/Hip Exercises: Aerobic   Nustep  L2 x16 min      Knee/Hip Exercises: Machines for Strengthening   Cybex Knee Extension  10# 3x10 reps    Cybex Knee Flexion  30# 3x10 reps    Cybex Leg Press  1 pl, seat 7 x20 reps      Modalities   Modalities  Electrical Stimulation;Vasopneumatic      Electrical Stimulation   Electrical Stimulation Location  L knee    Electrical Stimulation Action  IFC    Electrical Stimulation Parameters  80-150 hz x15 min    Electrical Stimulation Goals  Edema      Vasopneumatic   Number Minutes Vasopneumatic   15 minutes    Vasopnuematic Location   Knee    Vasopneumatic Pressure  Medium    Vasopneumatic Temperature   34                  PT Long Term Goals - 07/01/19 5852      PT LONG TERM GOAL #1   Title  Patient will be independent with HEP    Time  6    Period  Weeks    Status  On-going      PT LONG TERM GOAL #2   Title  Patient will demonstrate  120 degrees of left knee flexion AROM to improve ability to perform functional tasks.    Time  6    Period  Weeks    Status  Partially Met      PT LONG TERM GOAL #3   Title  Patient will demonstrate 4+/5 or greater left knee MMT in all planes to improve stability during functional tasks.    Time  6    Period  Weeks    Status  On-going      PT LONG TERM GOAL #4   Title  Patient will demonstrate reciprocating stair ambulation with one railing to safely enter and exit home.    Time  6    Period  Weeks    Status  Achieved      PT LONG TERM GOAL #5   Title  Patient will demonstrate 0 degrees of left knee extension AROM to improve gait mechanics.    Time  6    Period  Weeks    Status  Achieved            Plan - 07/08/19 1007    Clinical Impression Statement  Patient presented in clinic with reports of only stiffness overall. Patient able to complete more machine strengthening without complaint of pain. Normal modalities response noted following removal of the modalities.    Personal Factors and Comorbidities  Age;Comorbidity 2    Comorbidities  left TKA 05/27/2019; right TKA 11/24/2018    Examination-Activity Limitations  Stand;Dressing;Hygiene/Grooming;Locomotion Level    Stability/Clinical Decision Making  Stable/Uncomplicated    Rehab Potential  Excellent    PT Frequency  3x / week    PT Duration  6 weeks    PT Treatment/Interventions  ADLs/Self Care Home Management;Cryotherapy;Electrical Stimulation;Gait training;Stair training;Balance training;Therapeutic exercise;Neuromuscular re-education;Manual techniques;Passive range of motion;Patient/family education;Vasopneumatic Device;Moist Heat;Therapeutic activities;Functional mobility training    PT Next Visit Plan  Nustep, knee flexion AROM in supine, sitting and standing, hip strengthening, PROM to left knee, modalities PRN for pain relief.    PT Home Exercise Plan  see patient education section    Consulted and Agree with Plan of  Care  Patient       Patient will benefit from skilled therapeutic intervention in order to improve the following deficits and impairments:  Abnormal gait, Decreased activity tolerance, Decreased balance, Decreased strength, Difficulty walking, Decreased range of motion, Pain, Increased edema  Visit Diagnosis: Muscle weakness (generalized)  Acute pain of left knee  Stiffness of left knee, not elsewhere classified     Problem List Patient Active Problem List   Diagnosis Date Noted  . Gait abnormality 07/06/2019  . Degenerative arthritis of left knee 05/26/2019  . Total knee replacement status, right 11/24/2018  . Osteoarthritis of right knee 11/20/2018  . Mild cognitive impairment 10/09/2016  . High blood pressure 09/15/2013  . Hypothyroidism 09/15/2013  . Obesity (BMI 30-39.9) 09/15/2013  . S/P arthroscopy of left knee 10/02/2011  . Lateral meniscal tear 01/18/2011    Standley Brooking, PTA 07/08/2019, 10:09 AM  [facility] [address] [place], [place], [address] Phone: [phone]   Fax:  [phone]  Name: Zenia Guest Kingsley MRN: [id number] Date of Birth: 23-Sep-1945

## 2019-07-10 ENCOUNTER — Other Ambulatory Visit: Payer: Self-pay

## 2019-07-10 ENCOUNTER — Ambulatory Visit: Payer: Medicare Other | Admitting: Physical Therapy

## 2019-07-10 ENCOUNTER — Encounter: Payer: Self-pay | Admitting: Physical Therapy

## 2019-07-10 DIAGNOSIS — M6281 Muscle weakness (generalized): Secondary | ICD-10-CM

## 2019-07-10 DIAGNOSIS — M25662 Stiffness of left knee, not elsewhere classified: Secondary | ICD-10-CM

## 2019-07-10 DIAGNOSIS — M25562 Pain in left knee: Secondary | ICD-10-CM | POA: Diagnosis not present

## 2019-07-10 DIAGNOSIS — M25561 Pain in right knee: Secondary | ICD-10-CM | POA: Diagnosis not present

## 2019-07-10 NOTE — Therapy (Signed)
[facility] [place], [place], [address] Phone: [phone]   Fax:  [phone]  Physical Therapy Treatment  Patient Details  Name: Lisa Parker MRN: [id number] Date of Birth: 03-16-1946 Referring Provider (PT): Frederik Pear, MD   Encounter Date: 07/10/2019  PT End of Session - 07/10/19 0912    Visit Number  9    Number of Visits  18    Date for PT Re-Evaluation  07/29/19    Authorization Type  FOTO;  progress note every 10th visit, KX modifier at 15th visit    PT Start Time  0901    PT Stop Time  0952    PT Time Calculation (min)  51 min    Activity Tolerance  Patient tolerated treatment well    Behavior During Therapy  [facility] for tasks assessed/performed       Past Medical History:  Diagnosis Date  . Anxiety   . Arthritis   . Asthma   . HTN (hypertension)    "i had a gastric sleeve done and lost 100 pounds and i dont have htn anymore, my doctor, Dr Hilma Favors took me off of it "   . Hypothyroid   . Memory loss   . Seasonal allergies   . Sinus bradycardia   . Urinary tract infection     Past Surgical History:  Procedure Laterality Date  . BLEPHAROPLASTY Bilateral   . CATARACT EXTRACTION, BILATERAL     with lens   . CESAREAN SECTION    . FINGER SURGERY     left pinky  . HERNIA REPAIR  2004   inguinal , incisional, navel   . KNEE SURGERY Left    arthroscopic   . LAPAROSCOPIC GASTRIC SLEEVE RESECTION    . MASS EXCISION Left 10/20/2015   Procedure: EXCISION MASS LEFT SMALL FINGER;  Surgeon: Leanora Cover, MD;  Location: [facility];  Service: Orthopedics;  Laterality: Left;  . TONSILLECTOMY AND ADENOIDECTOMY    . TOTAL KNEE ARTHROPLASTY Right 11/24/2018   Procedure: Right Knee Arthroplasty;  Surgeon: Frederik Pear, MD;  Location: [facility];  Service: Orthopedics;  Laterality: Right;  . TOTAL KNEE ARTHROPLASTY Left 05/27/2019   Procedure: LEFT TOTAL KNEE ARTHROPLASTY SAMD DAY DISCHARGE;  Surgeon: Frederik Pear, MD;  Location: [facility];  Service: Orthopedics;  Laterality: Left;    There were no vitals filed for this visit.  Subjective Assessment - 07/10/19 0903    Subjective  COVID 19 screening performed on patient upon arrival. Reports her knee is pretty good today. Reports some tenderness around outside edge of knee.    Pertinent History  left TKA 05/27/2019; right TKA 11/24/2018    Limitations  Sitting;Walking;Standing;House hold activities    Diagnostic tests  x-ray    Patient Stated Goals  walk without AD    Currently in Pain?  No/denies         [facility] PT Assessment - 07/10/19 0001      Assessment   Medical Diagnosis  S/P left total knee replacement    Referring Provider (PT)  Frederik Pear, MD    Onset Date/Surgical Date  05/27/19    Next MD Visit  07/07/2019    Prior Therapy  not for left knee      Precautions   Precautions  Other (comment)    Precaution Comments  No Ultrasound      Restrictions   Weight Bearing Restrictions  No  [place] Adult PT Treatment/Exercise - 07/10/19 0001      Knee/Hip Exercises: Aerobic   Recumbent Bike  L3 x15 min      Knee/Hip Exercises: Machines for Strengthening   Cybex Knee Extension  10# 3x10 reps    Cybex Knee Flexion  30# 3x10 reps    Cybex Leg Press  1 pl, seat 7 x20 reps      Knee/Hip Exercises: Seated   Sit to Sand  20 reps;without UE support   from low plinth height     Modalities   Modalities  Vasopneumatic      Vasopneumatic   Number Minutes Vasopneumatic   10 minutes    Vasopnuematic Location   Knee    Vasopneumatic Pressure  Medium    Vasopneumatic Temperature   34                  PT Long Term Goals - 07/01/19 0953      PT LONG TERM GOAL #1   Title  Patient will be independent with HEP    Time  6    Period  Weeks    Status  On-going      PT LONG TERM GOAL #2   Title  Patient will demonstrate 120 degrees of left knee flexion AROM to improve ability to perform functional  tasks.    Time  6    Period  Weeks    Status  Partially Met      PT LONG TERM GOAL #3   Title  Patient will demonstrate 4+/5 or greater left knee MMT in all planes to improve stability during functional tasks.    Time  6    Period  Weeks    Status  On-going      PT LONG TERM GOAL #4   Title  Patient will demonstrate reciprocating stair ambulation with one railing to safely enter and exit home.    Time  6    Period  Weeks    Status  Achieved      PT LONG TERM GOAL #5   Title  Patient will demonstrate 0 degrees of left knee extension AROM to improve gait mechanics.    Time  6    Period  Weeks    Status  Achieved            Plan - 07/10/19 1108    Clinical Impression Statement  Patient presented in clinic and is exceeding expectations for over one month post op. Patient functional but strength is definately new focus for treatment. Patient is still quick to utilize stationary objects during ambulation or standing at this time. Normal vasopnuematic response noted following removal of the modality.    Personal Factors and Comorbidities  Age;Comorbidity 2    Comorbidities  left TKA 05/27/2019; right TKA 11/24/2018    Examination-Activity Limitations  Stand;Dressing;Hygiene/Grooming;Locomotion Level    Stability/Clinical Decision Making  Stable/Uncomplicated    Rehab Potential  Excellent    PT Frequency  3x / week    PT Duration  6 weeks    PT Treatment/Interventions  ADLs/Self Care Home Management;Cryotherapy;Electrical Stimulation;Gait training;Stair training;Balance training;Therapeutic exercise;Neuromuscular re-education;Manual techniques;Passive range of motion;Patient/family education;Vasopneumatic Device;Moist Heat;Therapeutic activities;Functional mobility training    PT Next Visit Plan  Nustep, knee flexion AROM in supine, sitting and standing, hip strengthening, PROM to left knee, modalities PRN for pain relief.    PT Home Exercise Plan  see patient education section     Consulted and Agree with Plan of  Care  Patient       Patient will benefit from skilled therapeutic intervention in order to improve the following deficits and impairments:  Abnormal gait, Decreased activity tolerance, Decreased balance, Decreased strength, Difficulty walking, Decreased range of motion, Pain, Increased edema  Visit Diagnosis: Muscle weakness (generalized)  Acute pain of left knee  Stiffness of left knee, not elsewhere classified     Problem List Patient Active Problem List   Diagnosis Date Noted  . Gait abnormality 07/06/2019  . Degenerative arthritis of left knee 05/26/2019  . Total knee replacement status, right 11/24/2018  . Osteoarthritis of right knee 11/20/2018  . Mild cognitive impairment 10/09/2016  . High blood pressure 09/15/2013  . Hypothyroidism 09/15/2013  . Obesity (BMI 30-39.9) 09/15/2013  . S/P arthroscopy of left knee 10/02/2011  . Lateral meniscal tear 01/18/2011    Standley Brooking, PTA 07/10/2019, 11:15 AM  [facility] [address] [place], [place], [address] Phone: [phone]   Fax:  [phone]  Name: Lisa Parker MRN: [id number] Date of Birth: 1945/12/24

## 2019-07-14 ENCOUNTER — Ambulatory Visit: Payer: Medicare Other | Admitting: Physical Therapy

## 2019-07-14 ENCOUNTER — Other Ambulatory Visit: Payer: Self-pay

## 2019-07-14 DIAGNOSIS — M6281 Muscle weakness (generalized): Secondary | ICD-10-CM

## 2019-07-14 DIAGNOSIS — M25562 Pain in left knee: Secondary | ICD-10-CM | POA: Diagnosis not present

## 2019-07-14 DIAGNOSIS — M25662 Stiffness of left knee, not elsewhere classified: Secondary | ICD-10-CM | POA: Diagnosis not present

## 2019-07-14 DIAGNOSIS — M25561 Pain in right knee: Secondary | ICD-10-CM | POA: Diagnosis not present

## 2019-07-14 NOTE — Therapy (Signed)
[facility] [place], [place], [address] Phone: [phone]   Fax:  [phone]  Physical Therapy Treatment  Patient Details  Name: Lisa Parker MRN: [id number] Date of Birth: 1946/02/17 Referring Provider (PT): Frederik Pear, MD   Encounter Date: 07/14/2019  PT End of Session - 07/14/19 1142    Visit Number  10    Number of Visits  18    Date for PT Re-Evaluation  07/29/19    Authorization Type  FOTO;  progress note every 10th visit, KX modifier at 15th visit    PT Start Time  1118    PT Stop Time  1159    PT Time Calculation (min)  41 min    Activity Tolerance  Patient tolerated treatment well    Behavior During Therapy  [facility] for tasks assessed/performed       Past Medical History:  Diagnosis Date  . Anxiety   . Arthritis   . Asthma   . HTN (hypertension)    "i had a gastric sleeve done and lost 100 pounds and i dont have htn anymore, my doctor, Dr Hilma Favors took me off of it "   . Hypothyroid   . Memory loss   . Seasonal allergies   . Sinus bradycardia   . Urinary tract infection     Past Surgical History:  Procedure Laterality Date  . BLEPHAROPLASTY Bilateral   . CATARACT EXTRACTION, BILATERAL     with lens   . CESAREAN SECTION    . FINGER SURGERY     left pinky  . HERNIA REPAIR  2004   inguinal , incisional, navel   . KNEE SURGERY Left    arthroscopic   . LAPAROSCOPIC GASTRIC SLEEVE RESECTION    . MASS EXCISION Left 10/20/2015   Procedure: EXCISION MASS LEFT SMALL FINGER;  Surgeon: Leanora Cover, MD;  Location: [facility];  Service: Orthopedics;  Laterality: Left;  . TONSILLECTOMY AND ADENOIDECTOMY    . TOTAL KNEE ARTHROPLASTY Right 11/24/2018   Procedure: Right Knee Arthroplasty;  Surgeon: Frederik Pear, MD;  [facility];  Service: Orthopedics;  Laterality: Right;  . TOTAL KNEE ARTHROPLASTY Left 05/27/2019   Procedure: LEFT TOTAL KNEE ARTHROPLASTY SAMD DAY DISCHARGE;  Surgeon: Frederik Pear, MD;  [facility];  Service: Orthopedics;  Laterality: Left;    There were no vitals filed for this visit.                    [facility] Adult PT Treatment/Exercise - 07/14/19 0001      Exercises   Exercises  Knee/Hip      Knee/Hip Exercises: Aerobic   Recumbent Bike  Level 3 x 15 minutes progressing to seat 3.      Knee/Hip Exercises: Machines for Strengthening   Cybex Knee Extension  10# x 3 minutes.    Cybex Knee Flexion  30# x 3 minutes.    Cybex Leg Press  2 plates x 3 minutes.      Modalities   Modalities  Vasopneumatic      Vasopneumatic   Number Minutes Vasopneumatic   10 minutes    Vasopnuematic Location   --   Left knee.   Vasopneumatic Pressure  Medium    Vasopneumatic Temperature   34                  PT Long Term Goals - 07/14/19 1150      PT LONG TERM GOAL #1  Title  Patient will be independent with HEP    Time  6    Period  Weeks    Status  On-going      PT LONG TERM GOAL #2   Title  Patient will demonstrate 120 degrees of left knee flexion AROM to improve ability to perform functional tasks.    Baseline  115 degrees and passive to 122 degrees.    Time  6    Period  Weeks    Status  Partially Met      PT LONG TERM GOAL #3   Title  Patient will demonstrate 4+/5 or greater left knee MMT in all planes to improve stability during functional tasks.    Time  6    Period  Weeks    Status  On-going      PT LONG TERM GOAL #4   Title  Patient will demonstrate reciprocating stair ambulation with one railing to safely enter and exit home.    Time  6    Period  Weeks    Status  Achieved      PT LONG TERM GOAL #5   Title  Patient will demonstrate 0 degrees of left knee extension AROM to improve gait mechanics.    Time  6    Period  Weeks    Status  Achieved            Plan - 07/14/19 1144    Clinical Impression Statement  Please "Therapy Note" section.    Personal Factors and Comorbidities  Age;Comorbidity 2     Comorbidities  left TKA 05/27/2019; right TKA 11/24/2018    Examination-Activity Limitations  Stand;Dressing;Hygiene/Grooming;Locomotion Level    Stability/Clinical Decision Making  Stable/Uncomplicated    Rehab Potential  Excellent    PT Frequency  3x / week    PT Duration  6 weeks    PT Treatment/Interventions  ADLs/Self Care Home Management;Cryotherapy;Electrical Stimulation;Gait training;Stair training;Balance training;Therapeutic exercise;Neuromuscular re-education;Manual techniques;Passive range of motion;Patient/family education;Vasopneumatic Device;Moist Heat;Therapeutic activities;Functional mobility training    PT Next Visit Plan  Nustep, knee flexion AROM in supine, sitting and standing, hip strengthening, PROM to left knee, modalities PRN for pain relief.    PT Home Exercise Plan  see patient education section    Consulted and Agree with Plan of Care  Patient       Patient will benefit from skilled therapeutic intervention in order to improve the following deficits and impairments:     Visit Diagnosis: Muscle weakness (generalized)  Acute pain of left knee  Stiffness of left knee, not elsewhere classified     Problem List Patient Active Problem List   Diagnosis Date Noted  . Gait abnormality 07/06/2019  . Degenerative arthritis of left knee 05/26/2019  . Total knee replacement status, right 11/24/2018  . Osteoarthritis of right knee 11/20/2018  . Mild cognitive impairment 10/09/2016  . High blood pressure 09/15/2013  . Hypothyroidism 09/15/2013  . Obesity (BMI 30-39.9) 09/15/2013  . S/P arthroscopy of left knee 10/02/2011  . Lateral meniscal tear 01/18/2011    Progress Note Reporting Period 06/10/19 to 07/14/19  See note below for Objective Data and Assessment of Progress/Goals. Excellent progress with LTG's #4 and #5 met.     Kalenna Millett, [place] MPT 07/14/2019, 12:02 PM  [facility] [address] [place], [place],  [address] Phone: [phone]   Fax:  [phone]  Name: Lisa Parker MRN: [id number] Date of Birth: 1945-05-18

## 2019-07-17 ENCOUNTER — Other Ambulatory Visit: Payer: Self-pay

## 2019-07-17 ENCOUNTER — Encounter: Payer: Self-pay | Admitting: Physical Therapy

## 2019-07-17 ENCOUNTER — Ambulatory Visit: Payer: Medicare Other | Admitting: Physical Therapy

## 2019-07-17 DIAGNOSIS — M25561 Pain in right knee: Secondary | ICD-10-CM | POA: Diagnosis not present

## 2019-07-17 DIAGNOSIS — M25662 Stiffness of left knee, not elsewhere classified: Secondary | ICD-10-CM

## 2019-07-17 DIAGNOSIS — M25562 Pain in left knee: Secondary | ICD-10-CM | POA: Diagnosis not present

## 2019-07-17 DIAGNOSIS — M6281 Muscle weakness (generalized): Secondary | ICD-10-CM

## 2019-07-17 NOTE — Therapy (Signed)
[facility] [place], [place], [address] Phone: [phone]   Fax:  [phone]  Physical Therapy Treatment  Patient Details  Name: Lisa Parker MRN: [id number] Date of Birth: May 20, 1945 Referring Provider (PT): Frederik Pear, MD   Encounter Date: 07/17/2019  PT End of Session - 07/17/19 1019    Visit Number  11    Number of Visits  18    Date for PT Re-Evaluation  07/29/19    Authorization Type  FOTO;  progress note every 10th visit, KX modifier at 15th visit    PT Start Time  0945    PT Stop Time  1035    PT Time Calculation (min)  50 min    Equipment Utilized During Treatment  Other (comment)    Activity Tolerance  Patient tolerated treatment well       Past Medical History:  Diagnosis Date  . Anxiety   . Arthritis   . Asthma   . HTN (hypertension)    "i had a gastric sleeve done and lost 100 pounds and i dont have htn anymore, my doctor, Dr Hilma Favors took me off of it "   . Hypothyroid   . Memory loss   . Seasonal allergies   . Sinus bradycardia   . Urinary tract infection     Past Surgical History:  Procedure Laterality Date  . BLEPHAROPLASTY Bilateral   . CATARACT EXTRACTION, BILATERAL     with lens   . CESAREAN SECTION    . FINGER SURGERY     left pinky  . HERNIA REPAIR  2004   inguinal , incisional, navel   . KNEE SURGERY Left    arthroscopic   . LAPAROSCOPIC GASTRIC SLEEVE RESECTION    . MASS EXCISION Left 10/20/2015   Procedure: EXCISION MASS LEFT SMALL FINGER;  Surgeon: Leanora Cover, MD;  Location: [facility];  Service: Orthopedics;  Laterality: Left;  . TONSILLECTOMY AND ADENOIDECTOMY    . TOTAL KNEE ARTHROPLASTY Right 11/24/2018   Procedure: Right Knee Arthroplasty;  Surgeon: Frederik Pear, MD;  [facility];  Service: Orthopedics;  Laterality: Right;  . TOTAL KNEE ARTHROPLASTY Left 05/27/2019   Procedure: LEFT TOTAL KNEE ARTHROPLASTY SAMD DAY DISCHARGE;  Surgeon: Frederik Pear,  MD;  [facility];  Service: Orthopedics;  Laterality: Left;    There were no vitals filed for this visit.  Subjective Assessment - 07/17/19 1015    Subjective  COVID 19 screening performed on patient upon arrival.  Pt arriving reporting no pain.    Pertinent History  left TKA 05/27/2019; right TKA 11/24/2018    Limitations  Sitting;Walking;Standing;House hold activities    Diagnostic tests  x-ray    Patient Stated Goals  walk without AD    Currently in Pain?  No/denies                       [facility] Adult PT Treatment/Exercise - 07/17/19 0001      Exercises   Exercises  Knee/Hip      Knee/Hip Exercises: Aerobic   Recumbent Bike  L3 x 10 minutes      Knee/Hip Exercises: Machines for Strengthening   Cybex Knee Extension  12# x 3 minutes.    Cybex Knee Flexion  30# x 3 minutes.    Cybex Leg Press  2 plates x 3 minutes.      Knee/Hip Exercises: Standing   Lateral Step Up  Left;15 reps  Lateral Step Up Limitations  UE support in parallel bars    Rocker Board  2 minutes    Other Standing Knee Exercises  forward lunge with foot on 6 inch step x 15 with UE support      Modalities   Modalities  Vasopneumatic      Vasopneumatic   Number Minutes Vasopneumatic   10 minutes    Vasopnuematic Location   Knee    Vasopneumatic Pressure  Medium    Vasopneumatic Temperature   32             PT Education - 07/17/19 1019    Education Details  Exercise techniques    Person(s) Educated  Patient    Methods  Explanation;Demonstration    Comprehension  Verbalized understanding;Returned demonstration          PT Long Term Goals - 07/17/19 1027      PT LONG TERM GOAL #1   Title  Patient will be independent with HEP    Time  6    Period  Weeks    Status  On-going      PT LONG TERM GOAL #2   Title  Patient will demonstrate 120 degrees of left knee flexion AROM to improve ability to perform functional tasks.    Baseline  122 degrees passively    Period   Weeks    Status  On-going      PT LONG TERM GOAL #3   Title  Patient will demonstrate 4+/5 or greater left knee MMT in all planes to improve stability during functional tasks.    Time  6    Period  Weeks    Status  On-going      PT LONG TERM GOAL #4   Title  Patient will demonstrate reciprocating stair ambulation with one railing to safely enter and exit home.    Time  6    Period  Weeks    Status  Achieved      PT LONG TERM GOAL #5   Title  Patient will demonstrate 0 degrees of left knee extension AROM to improve gait mechanics.    Time  6    Status  New            Plan - 07/17/19 1024    Clinical Impression Statement  Pt arriving to therapy reporting no pain. Pt tolerating exericses well with instructions for techniques. Pt's PROM is 0-122 degrees in L knee flexion, PROM R knee 0-125degrees. Pt progressing with strengtheing. Recommend L orthotic to prevent over supination. Continue skilled PT per pt's POC progressing toward LTG's.    Personal Factors and Comorbidities  Age;Comorbidity 2    Comorbidities  left TKA 05/27/2019; right TKA 11/24/2018    Examination-Activity Limitations  Stand;Dressing;Hygiene/Grooming;Locomotion Level    Stability/Clinical Decision Making  Stable/Uncomplicated    Rehab Potential  Excellent    PT Frequency  3x / week    PT Duration  6 weeks    PT Treatment/Interventions  ADLs/Self Care Home Management;Cryotherapy;Electrical Stimulation;Gait training;Stair training;Balance training;Therapeutic exercise;Neuromuscular re-education;Manual techniques;Passive range of motion;Patient/family education;Vasopneumatic Device;Moist Heat;Therapeutic activities;Functional mobility training    PT Next Visit Plan  Nustep, knee flexion AROM in supine, sitting and standing, hip strengthening, PROM to left knee, modalities PRN for pain relief.    PT Home Exercise Plan  see patient education section    Consulted and Agree with Plan of Care  Patient       Patient  will benefit from skilled therapeutic intervention  in order to improve the following deficits and impairments:  Abnormal gait, Decreased activity tolerance, Decreased balance, Decreased strength, Difficulty walking, Decreased range of motion, Pain, Increased edema  Visit Diagnosis: Muscle weakness (generalized)  Acute pain of left knee  Stiffness of left knee, not elsewhere classified  Acute pain of right knee     Problem List Patient Active Problem List   Diagnosis Date Noted  . Gait abnormality 07/06/2019  . Degenerative arthritis of left knee 05/26/2019  . Total knee replacement status, right 11/24/2018  . Osteoarthritis of right knee 11/20/2018  . Mild cognitive impairment 10/09/2016  . High blood pressure 09/15/2013  . Hypothyroidism 09/15/2013  . Obesity (BMI 30-39.9) 09/15/2013  . S/P arthroscopy of left knee 10/02/2011  . Lateral meniscal tear 01/18/2011    Oretha Caprice, PT 07/17/2019, 10:31 AM  [facility] [address] [place], [place], [address] Phone: [phone]   Fax:  [phone]  Name: Lisa Parker MRN: [id number] Date of Birth: 05/17/1945

## 2019-07-21 ENCOUNTER — Ambulatory Visit: Payer: Medicare Other | Admitting: Physical Therapy

## 2019-07-21 ENCOUNTER — Encounter: Payer: Self-pay | Admitting: Physical Therapy

## 2019-07-21 ENCOUNTER — Other Ambulatory Visit: Payer: Self-pay

## 2019-07-21 DIAGNOSIS — M25662 Stiffness of left knee, not elsewhere classified: Secondary | ICD-10-CM

## 2019-07-21 DIAGNOSIS — M6281 Muscle weakness (generalized): Secondary | ICD-10-CM | POA: Diagnosis not present

## 2019-07-21 DIAGNOSIS — M25561 Pain in right knee: Secondary | ICD-10-CM | POA: Diagnosis not present

## 2019-07-21 DIAGNOSIS — M25562 Pain in left knee: Secondary | ICD-10-CM

## 2019-07-21 NOTE — Therapy (Signed)
[facility] [place], [place], [address] Phone: [phone]   Fax:  [phone]  Physical Therapy Treatment  Patient Details  Name: Lisa Parker MRN: [id number] Date of Birth: Sep 08, 1945 Referring Provider (PT): Frederik Pear, MD   Encounter Date: 07/21/2019  PT End of Session - 07/21/19 1001    Visit Number  12    Number of Visits  18    Date for PT Re-Evaluation  07/29/19    Authorization Type  FOTO;  progress note every 10th visit, KX modifier at 15th visit    PT Start Time  0946    PT Stop Time  1036    PT Time Calculation (min)  50 min    Activity Tolerance  Patient tolerated treatment well    Behavior During Therapy  [facility] for tasks assessed/performed       Past Medical History:  Diagnosis Date  . Anxiety   . Arthritis   . Asthma   . HTN (hypertension)    "i had a gastric sleeve done and lost 100 pounds and i dont have htn anymore, my doctor, Dr Hilma Favors took me off of it "   . Hypothyroid   . Memory loss   . Seasonal allergies   . Sinus bradycardia   . Urinary tract infection     Past Surgical History:  Procedure Laterality Date  . BLEPHAROPLASTY Bilateral   . CATARACT EXTRACTION, BILATERAL     with lens   . CESAREAN SECTION    . FINGER SURGERY     left pinky  . HERNIA REPAIR  2004   inguinal , incisional, navel   . KNEE SURGERY Left    arthroscopic   . LAPAROSCOPIC GASTRIC SLEEVE RESECTION    . MASS EXCISION Left 10/20/2015   Procedure: EXCISION MASS LEFT SMALL FINGER;  Surgeon: Leanora Cover, MD;  Location: [facility];  Service: Orthopedics;  Laterality: Left;  . TONSILLECTOMY AND ADENOIDECTOMY    . TOTAL KNEE ARTHROPLASTY Right 11/24/2018   Procedure: Right Knee Arthroplasty;  Surgeon: Frederik Pear, MD;  Location: [facility];  Service: Orthopedics;  Laterality: Right;  . TOTAL KNEE ARTHROPLASTY Left 05/27/2019   Procedure: LEFT TOTAL KNEE ARTHROPLASTY SAMD DAY DISCHARGE;  Surgeon: Frederik Pear, MD;  Location[facility];  Service: Orthopedics;  Laterality: Left;    There were no vitals filed for this visit.  Subjective Assessment - 07/21/19 0945    Subjective  COVID 19 screening performed on patient upon arrival.  Pt reported soreness since last Thursday. Had to throw out shoes from prior to PT as they were already formed to her stance prior to TKR.    Pertinent History  left TKA 05/27/2019; right TKA 11/24/2018    Limitations  Sitting;Walking;Standing;House hold activities    Diagnostic tests  x-ray    Patient Stated Goals  walk without AD    Currently in Pain?  Yes    Pain Score  --   No numerical pain score provided by patient   Pain Location  Knee    Pain Orientation  Left    Pain Descriptors / Indicators  Sore    Pain Type  Surgical pain    Pain Onset  1 to 4 weeks ago    Pain Frequency  Constant         [facility] PT Assessment - 07/21/19 0001      Assessment   Medical Diagnosis  S/P left total knee replacement  Referring Provider (PT)  Frederik Pear, MD    Onset Date/Surgical Date  05/27/19    Next MD Visit  07/07/2019    Prior Therapy  not for left knee      Precautions   Precautions  Other (comment)    Precaution Comments  No Ultrasound      Restrictions   Weight Bearing Restrictions  No                   [facility] Adult PT Treatment/Exercise - 07/21/19 0001      Knee/Hip Exercises: Aerobic   Recumbent Bike  L3 x15 min      Knee/Hip Exercises: Machines for Strengthening   Cybex Knee Extension  10# 3x10 reps    Cybex Knee Flexion  40# 3x10 reps    Cybex Leg Press  2 plates x30 reps      Knee/Hip Exercises: Standing   Lateral Step Up  Left;2 sets;10 reps;Hand Hold: 2;Step Height: 6"    Forward Step Up  Left;3 sets;10 reps;Hand Hold: 2;Step Height: 6"      Knee/Hip Exercises: Seated   Sit to Sand  15 reps;without UE support   low plinth height     Modalities   Modalities  Vasopneumatic      Vasopneumatic   Number Minutes Vasopneumatic    10 minutes    Vasopnuematic Location   Knee    Vasopneumatic Pressure  Medium    Vasopneumatic Temperature   32                  PT Long Term Goals - 07/17/19 1027      PT LONG TERM GOAL #1   Title  Patient will be independent with HEP    Time  6    Period  Weeks    Status  On-going      PT LONG TERM GOAL #2   Title  Patient will demonstrate 120 degrees of left knee flexion AROM to improve ability to perform functional tasks.    Baseline  122 degrees passively    Period  Weeks    Status  On-going      PT LONG TERM GOAL #3   Title  Patient will demonstrate 4+/5 or greater left knee MMT in all planes to improve stability during functional tasks.    Time  6    Period  Weeks    Status  On-going      PT LONG TERM GOAL #4   Title  Patient will demonstrate reciprocating stair ambulation with one railing to safely enter and exit home.    Time  6    Period  Weeks    Status  Achieved      PT LONG TERM GOAL #5   Title  Patient will demonstrate 0 degrees of left knee extension AROM to improve gait mechanics.    Time  6    Status  New            Plan - 07/21/19 1040    Clinical Impression Statement  Patient arrived in clinic with reports of minimal soreness since last Thursday. Patient had no other complaints and able to complete strengthening and functional training without report of pain. Able to ascend stairs reciprically per patient report. Normal modalities response noted following removal of the modalities.    Personal Factors and Comorbidities  Age;Comorbidity 2    Comorbidities  left TKA 05/27/2019; right TKA 11/24/2018    Examination-Activity Limitations  Stand;Dressing;Hygiene/Grooming;Locomotion  Level    Stability/Clinical Decision Making  Stable/Uncomplicated    Rehab Potential  Excellent    PT Frequency  3x / week    PT Duration  6 weeks    PT Treatment/Interventions  ADLs/Self Care Home Management;Cryotherapy;Electrical Stimulation;Gait training;Stair  training;Balance training;Therapeutic exercise;Neuromuscular re-education;Manual techniques;Passive range of motion;Patient/family education;Vasopneumatic Device;Moist Heat;Therapeutic activities;Functional mobility training    PT Next Visit Plan  Nustep, knee flexion AROM in supine, sitting and standing, hip strengthening, PROM to left knee, modalities PRN for pain relief.    PT Home Exercise Plan  see patient education section    Consulted and Agree with Plan of Care  Patient       Patient will benefit from skilled therapeutic intervention in order to improve the following deficits and impairments:  Abnormal gait, Decreased activity tolerance, Decreased balance, Decreased strength, Difficulty walking, Decreased range of motion, Pain, Increased edema  Visit Diagnosis: Muscle weakness (generalized)  Acute pain of left knee  Stiffness of left knee, not elsewhere classified     Problem List Patient Active Problem List   Diagnosis Date Noted  . Gait abnormality 07/06/2019  . Degenerative arthritis of left knee 05/26/2019  . Total knee replacement status, right 11/24/2018  . Osteoarthritis of right knee 11/20/2018  . Mild cognitive impairment 10/09/2016  . High blood pressure 09/15/2013  . Hypothyroidism 09/15/2013  . Obesity (BMI 30-39.9) 09/15/2013  . S/P arthroscopy of left knee 10/02/2011  . Lateral meniscal tear 01/18/2011    Standley Brooking, PTA 07/21/2019, 10:44 AM  [facility] [address] [place], [place], [address] Phone: [phone]   Fax:  [phone]  Name: Lisa Parker MRN: [id number] Date of Birth: 08/10/1945

## 2019-07-23 ENCOUNTER — Encounter: Payer: Self-pay | Admitting: Physical Therapy

## 2019-07-23 ENCOUNTER — Other Ambulatory Visit: Payer: Self-pay

## 2019-07-23 ENCOUNTER — Ambulatory Visit: Payer: Medicare Other | Admitting: Physical Therapy

## 2019-07-23 DIAGNOSIS — M25562 Pain in left knee: Secondary | ICD-10-CM

## 2019-07-23 DIAGNOSIS — M25561 Pain in right knee: Secondary | ICD-10-CM | POA: Diagnosis not present

## 2019-07-23 DIAGNOSIS — M6281 Muscle weakness (generalized): Secondary | ICD-10-CM | POA: Diagnosis not present

## 2019-07-23 DIAGNOSIS — M25662 Stiffness of left knee, not elsewhere classified: Secondary | ICD-10-CM | POA: Diagnosis not present

## 2019-07-23 NOTE — Therapy (Signed)
[facility] [place], [place], [address] Phone: [phone]   Fax:  [phone]  Physical Therapy Treatment  Patient Details  Name: Lisa Parker MRN: [id number] Date of Birth: 1945/07/01 Referring Provider (PT): Frederik Pear, MD   Encounter Date: 07/23/2019  PT End of Session - 07/23/19 1010    Visit Number  13    Number of Visits  18    Date for PT Re-Evaluation  07/29/19    Authorization Type  FOTO;  progress note every 10th visit, KX modifier at 15th visit    PT Start Time  0956    PT Stop Time  1038    PT Time Calculation (min)  42 min    Activity Tolerance  Patient tolerated treatment well    Behavior During Therapy  [facility] for tasks assessed/performed       Past Medical History:  Diagnosis Date  . Anxiety   . Arthritis   . Asthma   . HTN (hypertension)    "i had a gastric sleeve done and lost 100 pounds and i dont have htn anymore, my doctor, Dr Hilma Favors took me off of it "   . Hypothyroid   . Memory loss   . Seasonal allergies   . Sinus bradycardia   . Urinary tract infection     Past Surgical History:  Procedure Laterality Date  . BLEPHAROPLASTY Bilateral   . CATARACT EXTRACTION, BILATERAL     with lens   . CESAREAN SECTION    . FINGER SURGERY     left pinky  . HERNIA REPAIR  2004   inguinal , incisional, navel   . KNEE SURGERY Left    arthroscopic   . LAPAROSCOPIC GASTRIC SLEEVE RESECTION    . MASS EXCISION Left 10/20/2015   Procedure: EXCISION MASS LEFT SMALL FINGER;  Surgeon: Leanora Cover, MD;  Location: [facility];  Service: Orthopedics;  Laterality: Left;  . TONSILLECTOMY AND ADENOIDECTOMY    . TOTAL KNEE ARTHROPLASTY Right 11/24/2018   Procedure: Right Knee Arthroplasty;  Surgeon: Frederik Pear, MD;  [facility];  Service: Orthopedics;  Laterality: Right;  . TOTAL KNEE ARTHROPLASTY Left 05/27/2019   Procedure: LEFT TOTAL KNEE ARTHROPLASTY SAMD DAY DISCHARGE;  Surgeon: Frederik Pear, MD;  [facility];  Service: Orthopedics;  Laterality: Left;    There were no vitals filed for this visit.  Subjective Assessment - 07/23/19 0959    Subjective  COVID 19 screening performed on patient upon arrival. Reports she had her R knee flexed well while riding to PT but when she got out of the car R knee would not straighten.    Pertinent History  left TKA 05/27/2019; right TKA 11/24/2018    Limitations  Sitting;Walking;Standing;House hold activities    Diagnostic tests  x-ray    Patient Stated Goals  walk without AD    Currently in Pain?  Yes    Pain Score  10-Worst pain ever    Pain Location  Knee    Pain Orientation  Right    Pain Descriptors / Indicators  Discomfort    Pain Type  Surgical pain    Pain Onset  Today    Pain Frequency  Constant         [facility] PT Assessment - 07/23/19 0001      Assessment   Medical Diagnosis  S/P left total knee replacement    Referring Provider (PT)  Frederik Pear, MD    Onset Date/Surgical  Date  05/27/19    Next MD Visit  07/07/2019    Prior Therapy  not for left knee      Precautions   Precautions  Other (comment)    Precaution Comments  No Ultrasound      Restrictions   Weight Bearing Restrictions  No                   [facility] Adult PT Treatment/Exercise - 07/23/19 0001      Knee/Hip Exercises: Aerobic   Nustep  L1 x15 min for ROM      Knee/Hip Exercises: Seated   Long Arc Quad  Strengthening;Left;3 sets;10 reps;Weights    Long Arc Quad Weight  5 lbs.      Knee/Hip Exercises: Supine   Straight Leg Raises  AROM;Left;3 sets;10 reps      Modalities   Modalities  Vasopneumatic      Vasopneumatic   Number Minutes Vasopneumatic   10 minutes    Vasopnuematic Location   Knee    Vasopneumatic Pressure  Medium    Vasopneumatic Temperature   32                  PT Long Term Goals - 07/17/19 1027      PT LONG TERM GOAL #1   Title  Patient will be independent with HEP    Time  6    Period   Weeks    Status  On-going      PT LONG TERM GOAL #2   Title  Patient will demonstrate 120 degrees of left knee flexion AROM to improve ability to perform functional tasks.    Baseline  122 degrees passively    Period  Weeks    Status  On-going      PT LONG TERM GOAL #3   Title  Patient will demonstrate 4+/5 or greater left knee MMT in all planes to improve stability during functional tasks.    Time  6    Period  Weeks    Status  On-going      PT LONG TERM GOAL #4   Title  Patient will demonstrate reciprocating stair ambulation with one railing to safely enter and exit home.    Time  6    Period  Weeks    Status  Achieved      PT LONG TERM GOAL #5   Title  Patient will demonstrate 0 degrees of left knee extension AROM to improve gait mechanics.    Time  6    Status  New            Plan - 07/23/19 1141    Clinical Impression Statement  Patient presented in clinic with reports of R knee locking. Less pain and soreness noted in R knee after Nustep warm up but a gentle treatment completed to avoid any unnecessaruy pain. Normal vasopneumatic response noted following removal of the modality. Patient was CGA with ambulation for stability due to fear of pain and falling with R knee.    Personal Factors and Comorbidities  Age;Comorbidity 2    Comorbidities  left TKA 05/27/2019; right TKA 11/24/2018    Examination-Activity Limitations  Stand;Dressing;Hygiene/Grooming;Locomotion Level    Stability/Clinical Decision Making  Stable/Uncomplicated    Rehab Potential  Excellent    PT Frequency  3x / week    PT Duration  6 weeks    PT Treatment/Interventions  ADLs/Self Care Home Management;Cryotherapy;Electrical Stimulation;Gait training;Stair training;Balance training;Therapeutic exercise;Neuromuscular re-education;Manual techniques;Passive range of  motion;Patient/family education;Vasopneumatic Device;Moist Heat;Therapeutic activities;Functional mobility training    PT Next Visit Plan  D/c  summary required next visit with FOTO.    PT Home Exercise Plan  see patient education section    Consulted and Agree with Plan of Care  Patient       Patient will benefit from skilled therapeutic intervention in order to improve the following deficits and impairments:  Abnormal gait, Decreased activity tolerance, Decreased balance, Decreased strength, Difficulty walking, Decreased range of motion, Pain, Increased edema  Visit Diagnosis: Muscle weakness (generalized)  Acute pain of left knee  Stiffness of left knee, not elsewhere classified     Problem List Patient Active Problem List   Diagnosis Date Noted  . Gait abnormality 07/06/2019  . Degenerative arthritis of left knee 05/26/2019  . Total knee replacement status, right 11/24/2018  . Osteoarthritis of right knee 11/20/2018  . Mild cognitive impairment 10/09/2016  . High blood pressure 09/15/2013  . Hypothyroidism 09/15/2013  . Obesity (BMI 30-39.9) 09/15/2013  . S/P arthroscopy of left knee 10/02/2011  . Lateral meniscal tear 01/18/2011    Standley Brooking, PTA 07/23/2019, 11:57 AM  [facility] [address] [place], [place], [address] Phone: [phone]   Fax:  [phone]  Name: Cyndal Donatelli Morning MRN: [id number] Date of Birth: [birth year]

## 2019-07-28 ENCOUNTER — Other Ambulatory Visit: Payer: Self-pay

## 2019-07-28 ENCOUNTER — Ambulatory Visit: Payer: Medicare Other | Admitting: Physical Therapy

## 2019-07-28 ENCOUNTER — Encounter: Payer: Self-pay | Admitting: Physical Therapy

## 2019-07-28 DIAGNOSIS — M25562 Pain in left knee: Secondary | ICD-10-CM | POA: Diagnosis not present

## 2019-07-28 DIAGNOSIS — M25662 Stiffness of left knee, not elsewhere classified: Secondary | ICD-10-CM | POA: Diagnosis not present

## 2019-07-28 DIAGNOSIS — M6281 Muscle weakness (generalized): Secondary | ICD-10-CM | POA: Diagnosis not present

## 2019-07-28 DIAGNOSIS — M25561 Pain in right knee: Secondary | ICD-10-CM | POA: Diagnosis not present

## 2019-07-28 NOTE — Therapy (Signed)
[facility] [place], [place], [address] Phone: [phone]   Fax:  [phone]  Physical Therapy Treatment PHYSICAL THERAPY DISCHARGE SUMMARY  Visits from Start of Care: 14  Current functional level related to goals / functional outcomes: See below   Remaining deficits: All goals met   Education / Equipment: HEP Plan: Patient agrees to discharge.  Patient goals were met. Patient is being discharged due to meeting the stated rehab goals.  ?????  Gabriela Eves, PT, DPT   Patient Details  Name: Lisa Parker MRN: [id number] Date of Birth: [birth year] Referring Provider (PT): Frederik Pear, MD   Encounter Date: 07/28/2019  PT End of Session - 07/28/19 0943    Visit Number  14    Number of Visits  18    Date for PT Re-Evaluation  07/29/19    Authorization Type  FOTO;  progress note every 10th visit, KX modifier at 15th visit    PT Start Time  0946    PT Stop Time  1036    PT Time Calculation (min)  50 min    Activity Tolerance  Patient tolerated treatment well    Behavior During Therapy  [facility] for tasks assessed/performed       Past Medical History:  Diagnosis Date  . Anxiety   . Arthritis   . Asthma   . HTN (hypertension)    "i had a gastric sleeve done and lost 100 pounds and i dont have htn anymore, my doctor, Dr Hilma Favors took me off of it "   . Hypothyroid   . Memory loss   . Seasonal allergies   . Sinus bradycardia   . Urinary tract infection     Past Surgical History:  Procedure Laterality Date  . BLEPHAROPLASTY Bilateral   . CATARACT EXTRACTION, BILATERAL     with lens   . CESAREAN SECTION    . FINGER SURGERY     left pinky  . HERNIA REPAIR  2004   inguinal , incisional, navel   . KNEE SURGERY Left    arthroscopic   . LAPAROSCOPIC GASTRIC SLEEVE RESECTION    . MASS EXCISION Left 10/20/2015   Procedure: EXCISION MASS LEFT SMALL FINGER;  Surgeon: Leanora Cover, MD;  Location: [facility];  Service: Orthopedics;  Laterality: Left;  . TONSILLECTOMY AND ADENOIDECTOMY    . TOTAL KNEE ARTHROPLASTY Right 11/24/2018   Procedure: Right Knee Arthroplasty;  Surgeon: Frederik Pear, MD;  [facility];  Service: Orthopedics;  Laterality: Right;  . TOTAL KNEE ARTHROPLASTY Left 05/27/2019   Procedure: LEFT TOTAL KNEE ARTHROPLASTY SAMD DAY DISCHARGE;  Surgeon: Frederik Pear, MD;  [facility];  Service: Orthopedics;  Laterality: Left;    There were no vitals filed for this visit.  Subjective Assessment - 07/28/19 0942    Subjective  COVID 19 screening performed on patient upon arrival. Reports some pain in posterior L knee but has been icing B knees since flare up of R knee pain last week.    Pertinent History  left TKA 05/27/2019; right TKA 11/24/2018    Limitations  Sitting;Walking;Standing;House hold activities    Diagnostic tests  x-ray    Patient Stated Goals  walk without AD    Currently in Pain?  Yes    Pain Score  2     Pain Location  Knee    Pain Orientation  Left    Pain Descriptors / Indicators  Discomfort    Pain Type  Surgical pain    Pain Onset  More than a month ago    Pain Frequency  Constant         [facility] PT Assessment - 07/28/19 0001      Assessment   Medical Diagnosis  S/P left total knee replacement    Referring Provider (PT)  Frederik Pear, MD    Onset Date/Surgical Date  05/27/19    Next MD Visit  08/19/2019    Prior Therapy  not for left knee      Precautions   Precautions  Other (comment)    Precaution Comments  No Ultrasound      Restrictions   Weight Bearing Restrictions  No      Observation/Other Assessments   Focus on Therapeutic Outcomes (FOTO)   20%, CJ      ROM / Strength   AROM / PROM / Strength  AROM;Strength      AROM   Overall AROM   Within functional limits for tasks performed    AROM Assessment Site  Knee    Right/Left Knee  Left    Left Knee Extension  0    Left Knee Flexion  120      Strength   Overall Strength   Within functional limits for tasks performed    Strength Assessment Site  Knee    Right/Left Knee  Left    Left Knee Flexion  4+/5    Left Knee Extension  4+/5                   [facility] Adult PT Treatment/Exercise - 07/28/19 0001      Knee/Hip Exercises: Aerobic   Nustep  L4, seat 7 x15 min   2 miles     Knee/Hip Exercises: Machines for Strengthening   Cybex Knee Extension  10# 3x10 reps    Cybex Knee Flexion  30# 3x10 reps    Cybex Leg Press  2 pl, seat 6 x30 reps    Total Gym Leg Press         Modalities   Modalities  Vasopneumatic      Vasopneumatic   Number Minutes Vasopneumatic   10 minutes    Vasopnuematic Location   Knee    Vasopneumatic Pressure  Medium    Vasopneumatic Temperature   32                  PT Long Term Goals - 07/28/19 1020      PT LONG TERM GOAL #1   Title  Patient will be independent with HEP    Time  6    Period  Weeks    Status  Achieved      PT LONG TERM GOAL #2   Title  Patient will demonstrate 120 degrees of left knee flexion AROM to improve ability to perform functional tasks.    Baseline  122 degrees passively    Period  Weeks    Status  Achieved      PT LONG TERM GOAL #3   Title  Patient will demonstrate 4+/5 or greater left knee MMT in all planes to improve stability during functional tasks.    Time  6    Period  Weeks    Status  Achieved      PT LONG TERM GOAL #4   Title  Patient will demonstrate reciprocating stair ambulation with one railing to safely enter and exit home.    Time  6    Period  Weeks    Status  Achieved      PT LONG TERM GOAL #5   Title  Patient will demonstrate 0 degrees of left knee extension AROM to improve gait mechanics.    Time  6    Status  Achieved            Plan - 07/28/19 1047    Clinical Impression Statement  Patient presented in clinic with reports of great function within her home but still icing secondary to R knee pain. Patient guided through strengthening with  no complaints of pain. Patient able to achieve all goals set at evaluation. Normal vasopnuematic response noted following removal of the modality.    Personal Factors and Comorbidities  Age;Comorbidity 2    Comorbidities  left TKA 05/27/2019; right TKA 11/24/2018    Examination-Activity Limitations  Stand;Dressing;Hygiene/Grooming;Locomotion Level    Stability/Clinical Decision Making  Stable/Uncomplicated    Rehab Potential  Excellent    PT Frequency  3x / week    PT Duration  6 weeks    PT Treatment/Interventions  ADLs/Self Care Home Management;Cryotherapy;Electrical Stimulation;Gait training;Stair training;Balance training;Therapeutic exercise;Neuromuscular re-education;Manual techniques;Passive range of motion;Patient/family education;Vasopneumatic Device;Moist Heat;Therapeutic activities;Functional mobility training    PT Next Visit Plan  D/C summary required.    PT Home Exercise Plan  see patient education section    Consulted and Agree with Plan of Care  Patient       Patient will benefit from skilled therapeutic intervention in order to improve the following deficits and impairments:  Abnormal gait, Decreased activity tolerance, Decreased balance, Decreased strength, Difficulty walking, Decreased range of motion, Pain, Increased edema  Visit Diagnosis: Muscle weakness (generalized)  Acute pain of left knee  Stiffness of left knee, not elsewhere classified     Problem List Patient Active Problem List   Diagnosis Date Noted  . Gait abnormality 07/06/2019  . Degenerative arthritis of left knee 05/26/2019  . Total knee replacement status, right 11/24/2018  . Osteoarthritis of right knee 11/20/2018  . Mild cognitive impairment 10/09/2016  . High blood pressure 09/15/2013  . Hypothyroidism 09/15/2013  . Obesity (BMI 30-39.9) 09/15/2013  . S/P arthroscopy of left knee 10/02/2011  . Lateral meniscal tear 01/18/2011    Standley Brooking, PTA 07/28/19 11:31 AM   [facility] [address] [place], [place], [address] Phone: [phone]   Fax:  [phone]  Name: Lisa Parker MRN: [id number] Date of Birth: December 19, 1945

## 2019-07-29 DIAGNOSIS — M9901 Segmental and somatic dysfunction of cervical region: Secondary | ICD-10-CM | POA: Diagnosis not present

## 2019-07-29 DIAGNOSIS — M9903 Segmental and somatic dysfunction of lumbar region: Secondary | ICD-10-CM | POA: Diagnosis not present

## 2019-07-29 DIAGNOSIS — M5137 Other intervertebral disc degeneration, lumbosacral region: Secondary | ICD-10-CM | POA: Diagnosis not present

## 2019-07-29 DIAGNOSIS — M9902 Segmental and somatic dysfunction of thoracic region: Secondary | ICD-10-CM | POA: Diagnosis not present

## 2019-07-30 DIAGNOSIS — M9902 Segmental and somatic dysfunction of thoracic region: Secondary | ICD-10-CM | POA: Diagnosis not present

## 2019-07-30 DIAGNOSIS — M9903 Segmental and somatic dysfunction of lumbar region: Secondary | ICD-10-CM | POA: Diagnosis not present

## 2019-07-30 DIAGNOSIS — M9901 Segmental and somatic dysfunction of cervical region: Secondary | ICD-10-CM | POA: Diagnosis not present

## 2019-07-30 DIAGNOSIS — M5137 Other intervertebral disc degeneration, lumbosacral region: Secondary | ICD-10-CM | POA: Diagnosis not present

## 2019-08-13 DIAGNOSIS — M5137 Other intervertebral disc degeneration, lumbosacral region: Secondary | ICD-10-CM | POA: Diagnosis not present

## 2019-08-13 DIAGNOSIS — M9901 Segmental and somatic dysfunction of cervical region: Secondary | ICD-10-CM | POA: Diagnosis not present

## 2019-08-13 DIAGNOSIS — M9903 Segmental and somatic dysfunction of lumbar region: Secondary | ICD-10-CM | POA: Diagnosis not present

## 2019-08-13 DIAGNOSIS — M9902 Segmental and somatic dysfunction of thoracic region: Secondary | ICD-10-CM | POA: Diagnosis not present

## 2019-08-27 DIAGNOSIS — M9902 Segmental and somatic dysfunction of thoracic region: Secondary | ICD-10-CM | POA: Diagnosis not present

## 2019-08-27 DIAGNOSIS — M5137 Other intervertebral disc degeneration, lumbosacral region: Secondary | ICD-10-CM | POA: Diagnosis not present

## 2019-08-27 DIAGNOSIS — M9901 Segmental and somatic dysfunction of cervical region: Secondary | ICD-10-CM | POA: Diagnosis not present

## 2019-08-27 DIAGNOSIS — M9903 Segmental and somatic dysfunction of lumbar region: Secondary | ICD-10-CM | POA: Diagnosis not present

## 2019-09-04 DIAGNOSIS — E785 Hyperlipidemia, unspecified: Secondary | ICD-10-CM | POA: Diagnosis not present

## 2019-09-04 DIAGNOSIS — I10 Essential (primary) hypertension: Secondary | ICD-10-CM | POA: Diagnosis not present

## 2019-09-04 DIAGNOSIS — M1991 Primary osteoarthritis, unspecified site: Secondary | ICD-10-CM | POA: Diagnosis not present

## 2019-09-04 DIAGNOSIS — E039 Hypothyroidism, unspecified: Secondary | ICD-10-CM | POA: Diagnosis not present

## 2019-09-15 ENCOUNTER — Other Ambulatory Visit (HOSPITAL_COMMUNITY): Payer: Self-pay | Admitting: Family Medicine

## 2019-09-15 DIAGNOSIS — Z1231 Encounter for screening mammogram for malignant neoplasm of breast: Secondary | ICD-10-CM

## 2019-09-23 DIAGNOSIS — M9903 Segmental and somatic dysfunction of lumbar region: Secondary | ICD-10-CM | POA: Diagnosis not present

## 2019-09-23 DIAGNOSIS — M9902 Segmental and somatic dysfunction of thoracic region: Secondary | ICD-10-CM | POA: Diagnosis not present

## 2019-09-23 DIAGNOSIS — M5137 Other intervertebral disc degeneration, lumbosacral region: Secondary | ICD-10-CM | POA: Diagnosis not present

## 2019-09-23 DIAGNOSIS — M9901 Segmental and somatic dysfunction of cervical region: Secondary | ICD-10-CM | POA: Diagnosis not present

## 2019-10-01 ENCOUNTER — Ambulatory Visit (HOSPITAL_COMMUNITY): Payer: Medicare Other

## 2019-10-07 DIAGNOSIS — M9901 Segmental and somatic dysfunction of cervical region: Secondary | ICD-10-CM | POA: Diagnosis not present

## 2019-10-07 DIAGNOSIS — M9903 Segmental and somatic dysfunction of lumbar region: Secondary | ICD-10-CM | POA: Diagnosis not present

## 2019-10-07 DIAGNOSIS — M9902 Segmental and somatic dysfunction of thoracic region: Secondary | ICD-10-CM | POA: Diagnosis not present

## 2019-10-07 DIAGNOSIS — M5137 Other intervertebral disc degeneration, lumbosacral region: Secondary | ICD-10-CM | POA: Diagnosis not present

## 2019-10-08 ENCOUNTER — Ambulatory Visit (HOSPITAL_COMMUNITY)
Admission: RE | Admit: 2019-10-08 | Discharge: 2019-10-08 | Disposition: A | Discharge status: Home / Self Care | Payer: Medicare Other | Source: Ambulatory Visit | Attending: Family Medicine | Admitting: Family Medicine

## 2019-10-08 ENCOUNTER — Other Ambulatory Visit: Payer: Self-pay

## 2019-10-08 DIAGNOSIS — Z1231 Encounter for screening mammogram for malignant neoplasm of breast: Secondary | ICD-10-CM | POA: Diagnosis not present

## 2019-10-12 DIAGNOSIS — E039 Hypothyroidism, unspecified: Secondary | ICD-10-CM | POA: Diagnosis not present

## 2019-10-12 DIAGNOSIS — I10 Essential (primary) hypertension: Secondary | ICD-10-CM | POA: Diagnosis not present

## 2019-10-12 DIAGNOSIS — E785 Hyperlipidemia, unspecified: Secondary | ICD-10-CM | POA: Diagnosis not present

## 2019-10-12 DIAGNOSIS — M2550 Pain in unspecified joint: Secondary | ICD-10-CM | POA: Diagnosis not present

## 2019-10-21 DIAGNOSIS — M5137 Other intervertebral disc degeneration, lumbosacral region: Secondary | ICD-10-CM | POA: Diagnosis not present

## 2019-10-21 DIAGNOSIS — M9902 Segmental and somatic dysfunction of thoracic region: Secondary | ICD-10-CM | POA: Diagnosis not present

## 2019-10-21 DIAGNOSIS — M9901 Segmental and somatic dysfunction of cervical region: Secondary | ICD-10-CM | POA: Diagnosis not present

## 2019-10-21 DIAGNOSIS — M9903 Segmental and somatic dysfunction of lumbar region: Secondary | ICD-10-CM | POA: Diagnosis not present

## 2019-11-04 DIAGNOSIS — I10 Essential (primary) hypertension: Secondary | ICD-10-CM | POA: Diagnosis not present

## 2019-11-04 DIAGNOSIS — E039 Hypothyroidism, unspecified: Secondary | ICD-10-CM | POA: Diagnosis not present

## 2019-11-04 DIAGNOSIS — M1991 Primary osteoarthritis, unspecified site: Secondary | ICD-10-CM | POA: Diagnosis not present

## 2019-11-04 DIAGNOSIS — E785 Hyperlipidemia, unspecified: Secondary | ICD-10-CM | POA: Diagnosis not present

## 2019-11-12 DIAGNOSIS — Z96651 Presence of right artificial knee joint: Secondary | ICD-10-CM | POA: Diagnosis not present

## 2019-11-12 DIAGNOSIS — M25562 Pain in left knee: Secondary | ICD-10-CM | POA: Diagnosis not present

## 2019-11-12 DIAGNOSIS — Z96652 Presence of left artificial knee joint: Secondary | ICD-10-CM | POA: Diagnosis not present

## 2019-11-18 DIAGNOSIS — M9901 Segmental and somatic dysfunction of cervical region: Secondary | ICD-10-CM | POA: Diagnosis not present

## 2019-11-18 DIAGNOSIS — M5137 Other intervertebral disc degeneration, lumbosacral region: Secondary | ICD-10-CM | POA: Diagnosis not present

## 2019-11-18 DIAGNOSIS — M9903 Segmental and somatic dysfunction of lumbar region: Secondary | ICD-10-CM | POA: Diagnosis not present

## 2019-11-18 DIAGNOSIS — M9902 Segmental and somatic dysfunction of thoracic region: Secondary | ICD-10-CM | POA: Diagnosis not present

## 2019-11-30 DIAGNOSIS — M9903 Segmental and somatic dysfunction of lumbar region: Secondary | ICD-10-CM | POA: Diagnosis not present

## 2019-11-30 DIAGNOSIS — M5137 Other intervertebral disc degeneration, lumbosacral region: Secondary | ICD-10-CM | POA: Diagnosis not present

## 2019-11-30 DIAGNOSIS — M9901 Segmental and somatic dysfunction of cervical region: Secondary | ICD-10-CM | POA: Diagnosis not present

## 2019-11-30 DIAGNOSIS — M9902 Segmental and somatic dysfunction of thoracic region: Secondary | ICD-10-CM | POA: Diagnosis not present

## 2019-12-01 DIAGNOSIS — H6523 Chronic serous otitis media, bilateral: Secondary | ICD-10-CM | POA: Diagnosis not present

## 2019-12-01 DIAGNOSIS — Z6823 Body mass index (BMI) 23.0-23.9, adult: Secondary | ICD-10-CM | POA: Diagnosis not present

## 2019-12-01 DIAGNOSIS — J302 Other seasonal allergic rhinitis: Secondary | ICD-10-CM | POA: Diagnosis not present

## 2019-12-01 DIAGNOSIS — E039 Hypothyroidism, unspecified: Secondary | ICD-10-CM | POA: Diagnosis not present

## 2019-12-02 DIAGNOSIS — M9903 Segmental and somatic dysfunction of lumbar region: Secondary | ICD-10-CM | POA: Diagnosis not present

## 2019-12-02 DIAGNOSIS — M5137 Other intervertebral disc degeneration, lumbosacral region: Secondary | ICD-10-CM | POA: Diagnosis not present

## 2019-12-02 DIAGNOSIS — M9901 Segmental and somatic dysfunction of cervical region: Secondary | ICD-10-CM | POA: Diagnosis not present

## 2019-12-02 DIAGNOSIS — M9902 Segmental and somatic dysfunction of thoracic region: Secondary | ICD-10-CM | POA: Diagnosis not present

## 2019-12-15 DIAGNOSIS — M9903 Segmental and somatic dysfunction of lumbar region: Secondary | ICD-10-CM | POA: Diagnosis not present

## 2019-12-15 DIAGNOSIS — M9901 Segmental and somatic dysfunction of cervical region: Secondary | ICD-10-CM | POA: Diagnosis not present

## 2019-12-15 DIAGNOSIS — M9902 Segmental and somatic dysfunction of thoracic region: Secondary | ICD-10-CM | POA: Diagnosis not present

## 2019-12-15 DIAGNOSIS — M5137 Other intervertebral disc degeneration, lumbosacral region: Secondary | ICD-10-CM | POA: Diagnosis not present

## 2019-12-16 ENCOUNTER — Other Ambulatory Visit: Payer: Self-pay

## 2019-12-21 DIAGNOSIS — I672 Cerebral atherosclerosis: Secondary | ICD-10-CM | POA: Diagnosis not present

## 2019-12-21 DIAGNOSIS — R202 Paresthesia of skin: Secondary | ICD-10-CM | POA: Diagnosis not present

## 2019-12-21 DIAGNOSIS — I6522 Occlusion and stenosis of left carotid artery: Secondary | ICD-10-CM | POA: Diagnosis not present

## 2019-12-21 DIAGNOSIS — I6523 Occlusion and stenosis of bilateral carotid arteries: Secondary | ICD-10-CM | POA: Diagnosis not present

## 2019-12-29 DIAGNOSIS — M9903 Segmental and somatic dysfunction of lumbar region: Secondary | ICD-10-CM | POA: Diagnosis not present

## 2019-12-29 DIAGNOSIS — M9902 Segmental and somatic dysfunction of thoracic region: Secondary | ICD-10-CM | POA: Diagnosis not present

## 2019-12-29 DIAGNOSIS — M9901 Segmental and somatic dysfunction of cervical region: Secondary | ICD-10-CM | POA: Diagnosis not present

## 2019-12-29 DIAGNOSIS — M5137 Other intervertebral disc degeneration, lumbosacral region: Secondary | ICD-10-CM | POA: Diagnosis not present

## 2020-01-05 DIAGNOSIS — M1991 Primary osteoarthritis, unspecified site: Secondary | ICD-10-CM | POA: Diagnosis not present

## 2020-01-05 DIAGNOSIS — E039 Hypothyroidism, unspecified: Secondary | ICD-10-CM | POA: Diagnosis not present

## 2020-01-05 DIAGNOSIS — E785 Hyperlipidemia, unspecified: Secondary | ICD-10-CM | POA: Diagnosis not present

## 2020-01-05 DIAGNOSIS — I10 Essential (primary) hypertension: Secondary | ICD-10-CM | POA: Diagnosis not present

## 2020-01-12 DIAGNOSIS — M9903 Segmental and somatic dysfunction of lumbar region: Secondary | ICD-10-CM | POA: Diagnosis not present

## 2020-01-12 DIAGNOSIS — M9901 Segmental and somatic dysfunction of cervical region: Secondary | ICD-10-CM | POA: Diagnosis not present

## 2020-01-12 DIAGNOSIS — M9902 Segmental and somatic dysfunction of thoracic region: Secondary | ICD-10-CM | POA: Diagnosis not present

## 2020-01-12 DIAGNOSIS — M5137 Other intervertebral disc degeneration, lumbosacral region: Secondary | ICD-10-CM | POA: Diagnosis not present

## 2020-02-01 DIAGNOSIS — Z23 Encounter for immunization: Secondary | ICD-10-CM | POA: Diagnosis not present

## 2020-02-03 DIAGNOSIS — M9901 Segmental and somatic dysfunction of cervical region: Secondary | ICD-10-CM | POA: Diagnosis not present

## 2020-02-03 DIAGNOSIS — M9902 Segmental and somatic dysfunction of thoracic region: Secondary | ICD-10-CM | POA: Diagnosis not present

## 2020-02-03 DIAGNOSIS — M9903 Segmental and somatic dysfunction of lumbar region: Secondary | ICD-10-CM | POA: Diagnosis not present

## 2020-02-03 DIAGNOSIS — M5137 Other intervertebral disc degeneration, lumbosacral region: Secondary | ICD-10-CM | POA: Diagnosis not present

## 2020-02-17 DIAGNOSIS — M5137 Other intervertebral disc degeneration, lumbosacral region: Secondary | ICD-10-CM | POA: Diagnosis not present

## 2020-02-17 DIAGNOSIS — M9903 Segmental and somatic dysfunction of lumbar region: Secondary | ICD-10-CM | POA: Diagnosis not present

## 2020-02-17 DIAGNOSIS — M9901 Segmental and somatic dysfunction of cervical region: Secondary | ICD-10-CM | POA: Diagnosis not present

## 2020-02-17 DIAGNOSIS — M9902 Segmental and somatic dysfunction of thoracic region: Secondary | ICD-10-CM | POA: Diagnosis not present

## 2020-03-02 DIAGNOSIS — M9901 Segmental and somatic dysfunction of cervical region: Secondary | ICD-10-CM | POA: Diagnosis not present

## 2020-03-02 DIAGNOSIS — M9902 Segmental and somatic dysfunction of thoracic region: Secondary | ICD-10-CM | POA: Diagnosis not present

## 2020-03-02 DIAGNOSIS — M9903 Segmental and somatic dysfunction of lumbar region: Secondary | ICD-10-CM | POA: Diagnosis not present

## 2020-03-02 DIAGNOSIS — M5137 Other intervertebral disc degeneration, lumbosacral region: Secondary | ICD-10-CM | POA: Diagnosis not present

## 2020-03-05 DIAGNOSIS — E039 Hypothyroidism, unspecified: Secondary | ICD-10-CM | POA: Diagnosis not present

## 2020-03-16 DIAGNOSIS — M9902 Segmental and somatic dysfunction of thoracic region: Secondary | ICD-10-CM | POA: Diagnosis not present

## 2020-03-16 DIAGNOSIS — M9903 Segmental and somatic dysfunction of lumbar region: Secondary | ICD-10-CM | POA: Diagnosis not present

## 2020-03-16 DIAGNOSIS — M9901 Segmental and somatic dysfunction of cervical region: Secondary | ICD-10-CM | POA: Diagnosis not present

## 2020-03-16 DIAGNOSIS — M5137 Other intervertebral disc degeneration, lumbosacral region: Secondary | ICD-10-CM | POA: Diagnosis not present

## 2020-03-30 DIAGNOSIS — M5137 Other intervertebral disc degeneration, lumbosacral region: Secondary | ICD-10-CM | POA: Diagnosis not present

## 2020-03-30 DIAGNOSIS — M9903 Segmental and somatic dysfunction of lumbar region: Secondary | ICD-10-CM | POA: Diagnosis not present

## 2020-03-30 DIAGNOSIS — M9901 Segmental and somatic dysfunction of cervical region: Secondary | ICD-10-CM | POA: Diagnosis not present

## 2020-03-30 DIAGNOSIS — M9902 Segmental and somatic dysfunction of thoracic region: Secondary | ICD-10-CM | POA: Diagnosis not present

## 2020-04-13 DIAGNOSIS — M5137 Other intervertebral disc degeneration, lumbosacral region: Secondary | ICD-10-CM | POA: Diagnosis not present

## 2020-04-13 DIAGNOSIS — M9903 Segmental and somatic dysfunction of lumbar region: Secondary | ICD-10-CM | POA: Diagnosis not present

## 2020-04-13 DIAGNOSIS — M9901 Segmental and somatic dysfunction of cervical region: Secondary | ICD-10-CM | POA: Diagnosis not present

## 2020-04-13 DIAGNOSIS — M9902 Segmental and somatic dysfunction of thoracic region: Secondary | ICD-10-CM | POA: Diagnosis not present

## 2020-04-27 DIAGNOSIS — M5137 Other intervertebral disc degeneration, lumbosacral region: Secondary | ICD-10-CM | POA: Diagnosis not present

## 2020-04-27 DIAGNOSIS — M9903 Segmental and somatic dysfunction of lumbar region: Secondary | ICD-10-CM | POA: Diagnosis not present

## 2020-04-27 DIAGNOSIS — M9902 Segmental and somatic dysfunction of thoracic region: Secondary | ICD-10-CM | POA: Diagnosis not present

## 2020-04-27 DIAGNOSIS — M9901 Segmental and somatic dysfunction of cervical region: Secondary | ICD-10-CM | POA: Diagnosis not present

## 2020-05-12 DIAGNOSIS — M9902 Segmental and somatic dysfunction of thoracic region: Secondary | ICD-10-CM | POA: Diagnosis not present

## 2020-05-12 DIAGNOSIS — M9903 Segmental and somatic dysfunction of lumbar region: Secondary | ICD-10-CM | POA: Diagnosis not present

## 2020-05-12 DIAGNOSIS — M9901 Segmental and somatic dysfunction of cervical region: Secondary | ICD-10-CM | POA: Diagnosis not present

## 2020-05-12 DIAGNOSIS — M5137 Other intervertebral disc degeneration, lumbosacral region: Secondary | ICD-10-CM | POA: Diagnosis not present

## 2020-06-01 DIAGNOSIS — M9901 Segmental and somatic dysfunction of cervical region: Secondary | ICD-10-CM | POA: Diagnosis not present

## 2020-06-01 DIAGNOSIS — M5137 Other intervertebral disc degeneration, lumbosacral region: Secondary | ICD-10-CM | POA: Diagnosis not present

## 2020-06-01 DIAGNOSIS — M9902 Segmental and somatic dysfunction of thoracic region: Secondary | ICD-10-CM | POA: Diagnosis not present

## 2020-06-01 DIAGNOSIS — M9903 Segmental and somatic dysfunction of lumbar region: Secondary | ICD-10-CM | POA: Diagnosis not present

## 2020-06-15 DIAGNOSIS — M9903 Segmental and somatic dysfunction of lumbar region: Secondary | ICD-10-CM | POA: Diagnosis not present

## 2020-06-15 DIAGNOSIS — M5137 Other intervertebral disc degeneration, lumbosacral region: Secondary | ICD-10-CM | POA: Diagnosis not present

## 2020-06-15 DIAGNOSIS — M9902 Segmental and somatic dysfunction of thoracic region: Secondary | ICD-10-CM | POA: Diagnosis not present

## 2020-06-15 DIAGNOSIS — M9901 Segmental and somatic dysfunction of cervical region: Secondary | ICD-10-CM | POA: Diagnosis not present

## 2020-06-30 DIAGNOSIS — M9903 Segmental and somatic dysfunction of lumbar region: Secondary | ICD-10-CM | POA: Diagnosis not present

## 2020-06-30 DIAGNOSIS — M9902 Segmental and somatic dysfunction of thoracic region: Secondary | ICD-10-CM | POA: Diagnosis not present

## 2020-06-30 DIAGNOSIS — M9901 Segmental and somatic dysfunction of cervical region: Secondary | ICD-10-CM | POA: Diagnosis not present

## 2020-06-30 DIAGNOSIS — M5137 Other intervertebral disc degeneration, lumbosacral region: Secondary | ICD-10-CM | POA: Diagnosis not present

## 2020-07-07 ENCOUNTER — Encounter: Payer: Self-pay | Admitting: Neurology

## 2020-07-07 ENCOUNTER — Ambulatory Visit: Payer: Medicare Other | Admitting: Neurology

## 2020-07-07 VITALS — BP 170/85 | HR 58 | Ht 66.0 in | Wt 178.0 lb

## 2020-07-07 DIAGNOSIS — R269 Unspecified abnormalities of gait and mobility: Secondary | ICD-10-CM

## 2020-07-07 DIAGNOSIS — G3184 Mild cognitive impairment, so stated: Secondary | ICD-10-CM | POA: Diagnosis not present

## 2020-07-07 MED ORDER — DONEPEZIL HCL 10 MG PO TABS: 10.0000 mg | ORAL_TABLET | Freq: Every day | ORAL | 4 refills | Status: DC

## 2020-07-07 MED ORDER — MEMANTINE HCL 10 MG PO TABS: ORAL_TABLET | ORAL | 4 refills | Status: DC

## 2020-07-07 NOTE — Progress Notes (Signed)
PATIENT: Lisa Parker DOB: April 05, 1946  Chief Complaint  Patient presents with  . Follow-up    MMSE 29/30 - 28 animals. She is here with her husband, Lisa Parker, for her yearly follow up for mild cognitive impairment. Feels her memory has remained stable. She has continued Aricept and Namenda, as prescribed.      HISTORICAL  Lisa Parker is a 75 years old right-handed female, seen in refer by her primary care doctor  Lisa Parker for evaluation of memory loss, initial evaluation was on October 09 2016.   I reviewed and summarized the referring note,she has history of hypothyroidism, on supplement, anxiety, she was recently changed from citalopram 40 mg to current escitalopram 20 mg since early May 2018, for worsening anxiety symptoms.  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  Mother had gradual onset memory loss in her eighties, now at age 36, she suffered significant memory loss, cognitive malfunction, to the point of needing help feeding, no longer ambulatory. Her maternal aunt died of Alzheimer's disease at age 27, her younger brother at age 69 also suffered some memory loss.  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 Her husband was recently diagnosed with Parkinson's disease.  I reviewed laboratory evaluation from [facility] in October 2017, elevated ferritin 388, B12 was 600, normal CBC, hemoglobin of 14.2,  UPDATE December 13 2016: She is accompanied by her husband at today's clinical visit, her anxiety is under better control with Lexapro 20 mg daily,  new mental status examination he is 30/30 today, animal naming is 13, normal clock drawing  We have personally reviewed MRI of the brain without contrast in June 2018, mild generalized atrophy  UPDATE May 28 2017: She is overall doing very well, is able to keep up with her busy daily routine, there is no significant memory loss, tolerating Namenda 10 mg twice a day, Aricept 10 mg daily  UPDATE May 29 2018: She is doing much better, 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, less stress, no significant memory loss  UPDATE July 06 2019: She is overall doing well, Mini-Mental Status Examination is 30 out of 30, she had knee placement, right side in July 2020, left side January 2021, recovering well,  Update July 07, 2020 She is overall doing well, taking good care of her husband as well, complains of worsening knee pain, gait abnormality due to multiple joints disease, memory is stable, today's Mini-Mental status is 29/30   REVIEW OF SYSTEMS: Full 14 system review of systems performed and notable only for as above  All rest review of the system were negative ALLERGIES: Allergies  Allergen Reactions  . Mold Extract  [Trichophyton]     Other reaction(s): Wheezing (ALLERGY/intolerance)  . Pollen Extract     Other reaction(s): Wheezing (ALLERGY/intolerance)  . Cucumber Extract Swelling  . Other     Melon and Walnuts - swelling    HOME MEDICATIONS: Current Outpatient Medications  Medication Sig Dispense Refill  . ALPRAZolam (XANAX) 0.5 MG tablet Take 0.5 mg by mouth at bedtime.    Marland Kitchen ascorbic  acid (VITAMIN C) 500 MG tablet Take 500 mg by mouth daily.    . budesonide-formoterol (SYMBICORT) 160-4.5 MCG/ACT inhaler Inhale 2 puffs into the lungs 2 (two) times daily as needed (shortness of breath).    Marland Kitchen buPROPion (WELLBUTRIN XL) 150 MG 24 hr tablet Take 150 mg by mouth daily.    . Calcium Carb-Cholecalciferol (CALCIUM 600 + D PO) Take 1 tablet by mouth daily.    . Cholecalciferol (VITAMIN D) 2000 UNITS  tablet Take 2,000 Units by mouth daily.    . Cholecalciferol (VITAMIN D) 50 MCG (2000 UT) tablet Take 2,000 Units by mouth daily.    . diclofenac Sodium (VOLTAREN) 1 % GEL Apply 1 application topically 4 (four) times daily as needed (pain).    Marland Kitchen donepezil (ARICEPT) 10 MG tablet Take 1 tablet (10 mg total) by mouth at bedtime. 90 tablet 4  . escitalopram (LEXAPRO) 20 MG tablet Take 20 mg by mouth daily.     . furosemide (LASIX) 20 MG tablet Take 20 mg by mouth daily.    Marland Kitchen levothyroxine (SYNTHROID) 150 MCG tablet Take 150 mcg by mouth daily before breakfast.    . memantine (NAMENDA) 10 MG tablet TAKE  (1) TABLET TWICE A DAY. 180 tablet 3  . Multiple Vitamin (MULTIVITAMIN WITH MINERALS) TABS tablet Take 1 tablet by mouth daily.    . Omega-3 Fatty Acids (OMEGA 3 500) 500 MG CAPS Take 500 mg by mouth daily.    Marland Kitchen oxybutynin (DITROPAN XL) 15 MG 24 hr tablet Take 15 mg by mouth daily.    . SUPER B COMPLEX/C PO Take 2 tablets by mouth daily.     No current facility-administered medications for this visit.    PAST MEDICAL HISTORY: Past Medical History:  Diagnosis Date  . Anxiety   . Arthritis   . Asthma   . HTN (hypertension)    "i had a gastric sleeve done and lost 100 pounds and i dont have htn anymore, my doctor, Lisa Parker took me off of it "   . Hypothyroid   . Memory loss   . Seasonal allergies   . Sinus bradycardia   . Urinary tract infection     PAST SURGICAL HISTORY: Past Surgical History:  Procedure Laterality Date  . BLEPHAROPLASTY Bilateral   . CATARACT EXTRACTION, BILATERAL     with lens   . CESAREAN SECTION    . FINGER SURGERY     left pinky  . HERNIA REPAIR  2004   inguinal , incisional, navel   . KNEE SURGERY Left    arthroscopic   . LAPAROSCOPIC GASTRIC SLEEVE RESECTION    . MASS EXCISION Left 10/20/2015   Procedure: EXCISION MASS LEFT SMALL FINGER;  Surgeon: Lisa Cover, Lisa Parker;  Location: [facility];  Service: Orthopedics;  Laterality: Left;  .  TONSILLECTOMY AND ADENOIDECTOMY    . TOTAL KNEE ARTHROPLASTY Right 11/24/2018   Procedure: Right Knee Arthroplasty;  Surgeon: Lisa Pear, Lisa Parker;  Location: [facility];  Service: Orthopedics;  Laterality: Right;  . TOTAL KNEE ARTHROPLASTY Left 05/27/2019   Procedure: LEFT TOTAL KNEE ARTHROPLASTY SAMD DAY DISCHARGE;  Surgeon: Lisa Pear, Lisa Parker;  Location: [facility];  Service: Orthopedics;  Laterality: Left;    FAMILY HISTORY: Family History  Problem Relation Age of Onset  . Alzheimer's disease Mother   . Lung cancer Father   . Heart disease Other   . Asthma Other   . Alzheimer's disease Maternal Aunt     SOCIAL HISTORY:  Social  History   Socioeconomic History  . Marital status: Married    Spouse name: Not on file  . Number of children: 1  . Years of education: some college  . Highest education level: Not on file  Occupational History  . Occupation: farm work - retired  Immunologist  . Smoking status: Former Smoker    Packs/day: 0.00    Years: 0.00    Pack years: 0.00    Types: Cigarettes    Start date: 35  . Smokeless tobacco: Never Used  Vaping Use  . Vaping Use: Never used  Substance and Sexual Activity  . Alcohol use: No  . Drug use: No  . Sexual activity: Not on file  Other Topics Concern  . Not on file  Social History Narrative   Lives at home with husband.   Right-handed.   No caffeine use.   Social Determinants of Health   Financial Resource Strain: Not on file  Food Insecurity: Not on file  Transportation Needs: Not on file  Physical Activity: Not on file  Stress: Not on file  Social Connections: Not on file  Intimate Partner Violence: Not on file     PHYSICAL EXAM   Vitals:   07/07/20 1032  BP: (!) 170/85  Pulse: (!) 58  Weight: 178 lb (80.7 kg)  Height: 5\' 6"  (1.676 m)   Not recorded     Body mass index is 28.73 kg/m.  PHYSICAL EXAMNIATION:  Gen: NAD, conversant, well nourised,well groomed            NEUROLOGICAL EXAM:  MENTAL  STATUS: MMSE - Mini Mental State Exam 07/07/2020 07/06/2019 05/29/2018  Orientation to time 5 5 5   Orientation to Place 5 5 5   Registration 3 3 3   Attention/ Calculation 5 5 5   Recall 3 3 3   Language- name 2 objects 2 2 2   Language- repeat 1 1 1   Language- follow 3 step command 3 3 3   Language- read & follow direction 1 1 1   Write a sentence 1 1 1   Copy design 0 1 1  Total score 29 30 30   animal naming 25    CRANIAL NERVES: CN II: Visual fields are full to confrontation. Pupils are round equal and briskly reactive to light. CN III, IV, VI: extraocular movement are normal. No ptosis. CN V: Facial sensation is intact to light touch CN VII: Face is symmetric with normal eye closure and smile. CN VIII: Hearing is normal to casual conversation CN IX, X: Phonation is normal. CN XI: Head turning and shoulder shrug are intact  MOTOR: There is no pronator drift of out-stretched arms. Muscle bulk and tone are normal. Muscle strength is normal.  REFLEXES: Reflexes are 1 and symmetric at the biceps, triceps, knees, and ankles. Plantar responses are flexor.  SENSORY: Intact to light touch,   COORDINATION: Rapid alternating movements and fine finger movements are intact. There is no dysmetria on finger-to-nose and heel-knee-shin.    GAIT/STANCE: She needs push-up to get at from seated position, valgus knee, mildly unsteady, scoliosis   DIAGNOSTIC DATA (LABS, IMAGING, TESTING) - I reviewed patient records, labs, notes, testing and imaging myself where available.   ASSESSMENT AND PLAN  Lisa Parker is a 76 y.o. female    Mild cognitive impairment   Strong family history of Alzheimer's disease  MRI of the brain without contrast showed mild generalized atrophy  Previous laboratory evaluation showed no treatable etiology,  Continue Namenda 10 mg twice a day,  Aricept 10 mg daily  Optimize thyroid supplement    Marcial Pacas, M.D. Ph.D.  [facility] [place], [place] [address] Phone: [phone] Fax:      [phone] Everything so you do not think with blood pressure

## 2020-07-13 DIAGNOSIS — M9902 Segmental and somatic dysfunction of thoracic region: Secondary | ICD-10-CM | POA: Diagnosis not present

## 2020-07-13 DIAGNOSIS — M9903 Segmental and somatic dysfunction of lumbar region: Secondary | ICD-10-CM | POA: Diagnosis not present

## 2020-07-13 DIAGNOSIS — M5137 Other intervertebral disc degeneration, lumbosacral region: Secondary | ICD-10-CM | POA: Diagnosis not present

## 2020-07-13 DIAGNOSIS — M9901 Segmental and somatic dysfunction of cervical region: Secondary | ICD-10-CM | POA: Diagnosis not present

## 2020-07-15 DIAGNOSIS — R001 Bradycardia, unspecified: Secondary | ICD-10-CM | POA: Diagnosis not present

## 2020-07-15 DIAGNOSIS — E559 Vitamin D deficiency, unspecified: Secondary | ICD-10-CM | POA: Diagnosis not present

## 2020-07-15 DIAGNOSIS — M1991 Primary osteoarthritis, unspecified site: Secondary | ICD-10-CM | POA: Diagnosis not present

## 2020-07-15 DIAGNOSIS — E039 Hypothyroidism, unspecified: Secondary | ICD-10-CM | POA: Diagnosis not present

## 2020-07-15 DIAGNOSIS — Z1389 Encounter for screening for other disorder: Secondary | ICD-10-CM | POA: Diagnosis not present

## 2020-07-15 DIAGNOSIS — Z0001 Encounter for general adult medical examination with abnormal findings: Secondary | ICD-10-CM | POA: Diagnosis not present

## 2020-07-15 DIAGNOSIS — E785 Hyperlipidemia, unspecified: Secondary | ICD-10-CM | POA: Diagnosis not present

## 2020-07-15 DIAGNOSIS — Z9884 Bariatric surgery status: Secondary | ICD-10-CM | POA: Diagnosis not present

## 2020-07-15 DIAGNOSIS — I10 Essential (primary) hypertension: Secondary | ICD-10-CM | POA: Diagnosis not present

## 2020-07-27 DIAGNOSIS — M5137 Other intervertebral disc degeneration, lumbosacral region: Secondary | ICD-10-CM | POA: Diagnosis not present

## 2020-07-27 DIAGNOSIS — M9903 Segmental and somatic dysfunction of lumbar region: Secondary | ICD-10-CM | POA: Diagnosis not present

## 2020-07-27 DIAGNOSIS — M9902 Segmental and somatic dysfunction of thoracic region: Secondary | ICD-10-CM | POA: Diagnosis not present

## 2020-07-27 DIAGNOSIS — M9901 Segmental and somatic dysfunction of cervical region: Secondary | ICD-10-CM | POA: Diagnosis not present

## 2020-08-03 DIAGNOSIS — E039 Hypothyroidism, unspecified: Secondary | ICD-10-CM | POA: Diagnosis not present

## 2020-08-10 DIAGNOSIS — M5137 Other intervertebral disc degeneration, lumbosacral region: Secondary | ICD-10-CM | POA: Diagnosis not present

## 2020-08-10 DIAGNOSIS — M9902 Segmental and somatic dysfunction of thoracic region: Secondary | ICD-10-CM | POA: Diagnosis not present

## 2020-08-10 DIAGNOSIS — M9903 Segmental and somatic dysfunction of lumbar region: Secondary | ICD-10-CM | POA: Diagnosis not present

## 2020-08-10 DIAGNOSIS — M9901 Segmental and somatic dysfunction of cervical region: Secondary | ICD-10-CM | POA: Diagnosis not present

## 2020-08-30 ENCOUNTER — Other Ambulatory Visit: Payer: Self-pay | Admitting: Neurology

## 2020-08-31 DIAGNOSIS — M5137 Other intervertebral disc degeneration, lumbosacral region: Secondary | ICD-10-CM | POA: Diagnosis not present

## 2020-08-31 DIAGNOSIS — M9902 Segmental and somatic dysfunction of thoracic region: Secondary | ICD-10-CM | POA: Diagnosis not present

## 2020-08-31 DIAGNOSIS — M9903 Segmental and somatic dysfunction of lumbar region: Secondary | ICD-10-CM | POA: Diagnosis not present

## 2020-08-31 DIAGNOSIS — M9901 Segmental and somatic dysfunction of cervical region: Secondary | ICD-10-CM | POA: Diagnosis not present

## 2020-09-03 DIAGNOSIS — E039 Hypothyroidism, unspecified: Secondary | ICD-10-CM | POA: Diagnosis not present

## 2020-09-14 DIAGNOSIS — M9902 Segmental and somatic dysfunction of thoracic region: Secondary | ICD-10-CM | POA: Diagnosis not present

## 2020-09-14 DIAGNOSIS — M5137 Other intervertebral disc degeneration, lumbosacral region: Secondary | ICD-10-CM | POA: Diagnosis not present

## 2020-09-14 DIAGNOSIS — M9903 Segmental and somatic dysfunction of lumbar region: Secondary | ICD-10-CM | POA: Diagnosis not present

## 2020-09-14 DIAGNOSIS — M9901 Segmental and somatic dysfunction of cervical region: Secondary | ICD-10-CM | POA: Diagnosis not present

## 2020-09-24 IMAGING — MG DIGITAL SCREENING BILAT W/ TOMO W/ CAD
6 of 12 series · 6 of 36 positions shown · non-contrast
Comparison: Previous exam(s).

CLINICAL DATA: Screening.

EXAM:
DIGITAL SCREENING BILATERAL MAMMOGRAM WITH TOMO AND CAD

[R CC synth-2D]
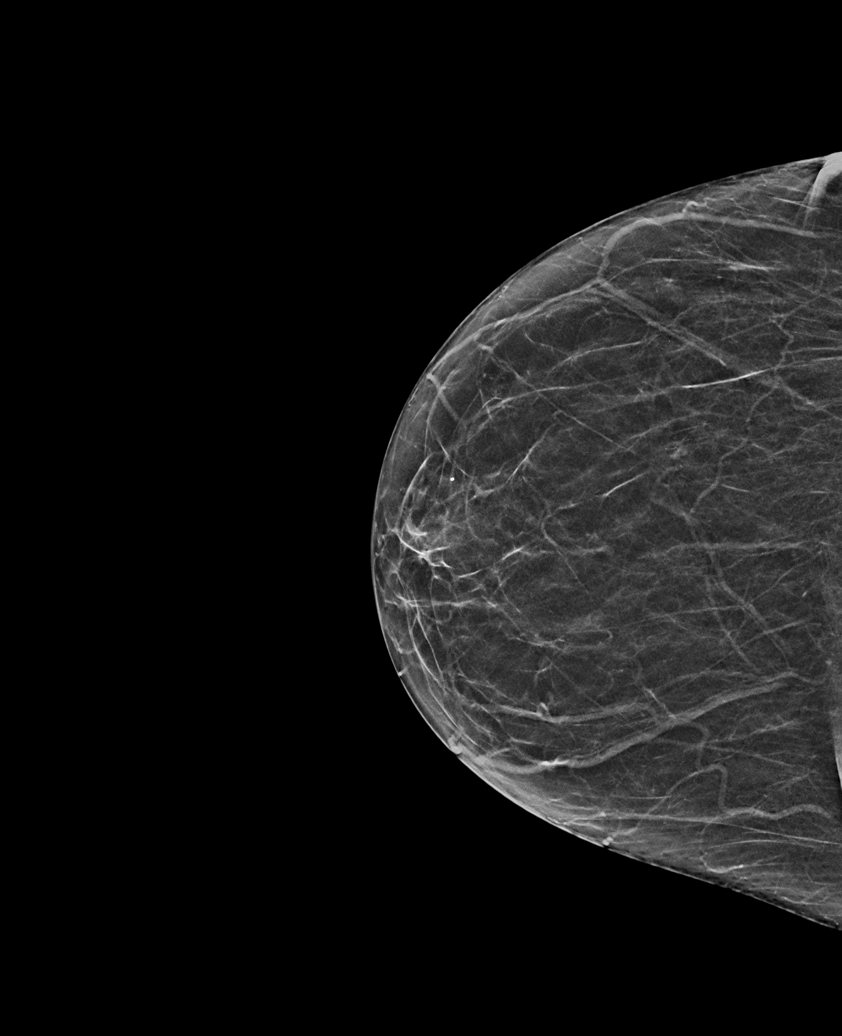

[R MLO synth-2D]
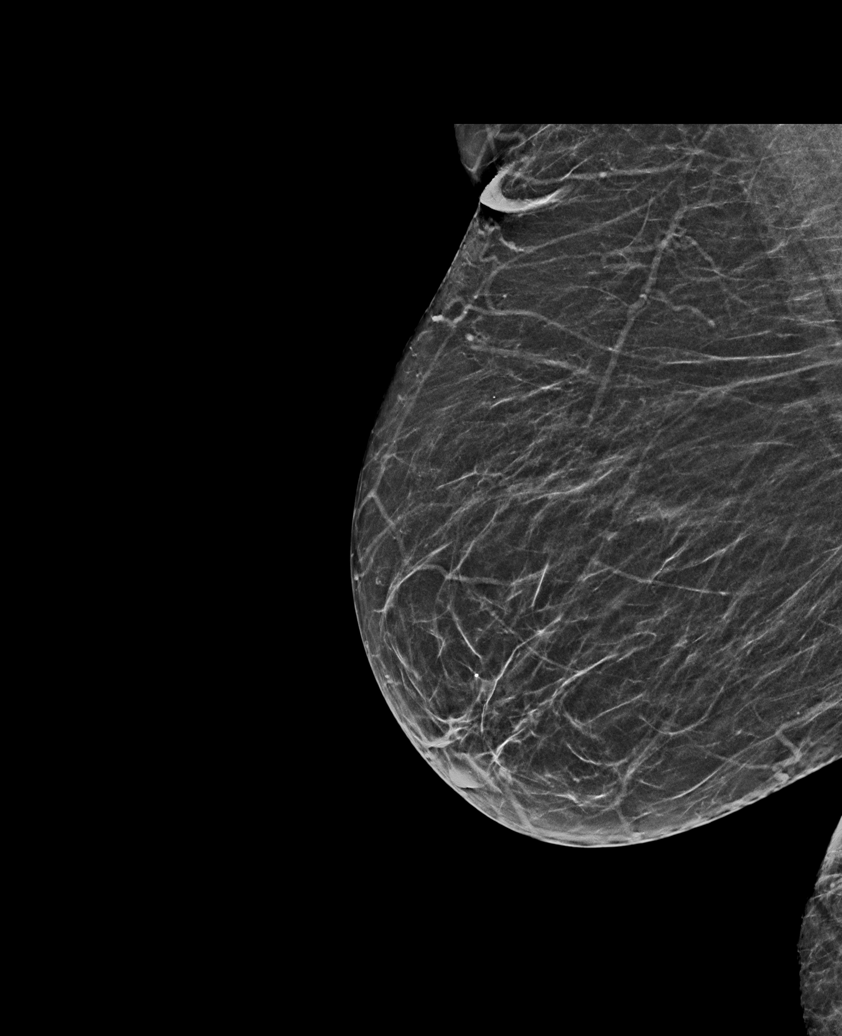

[L CC synth-2D (1 of 2)]
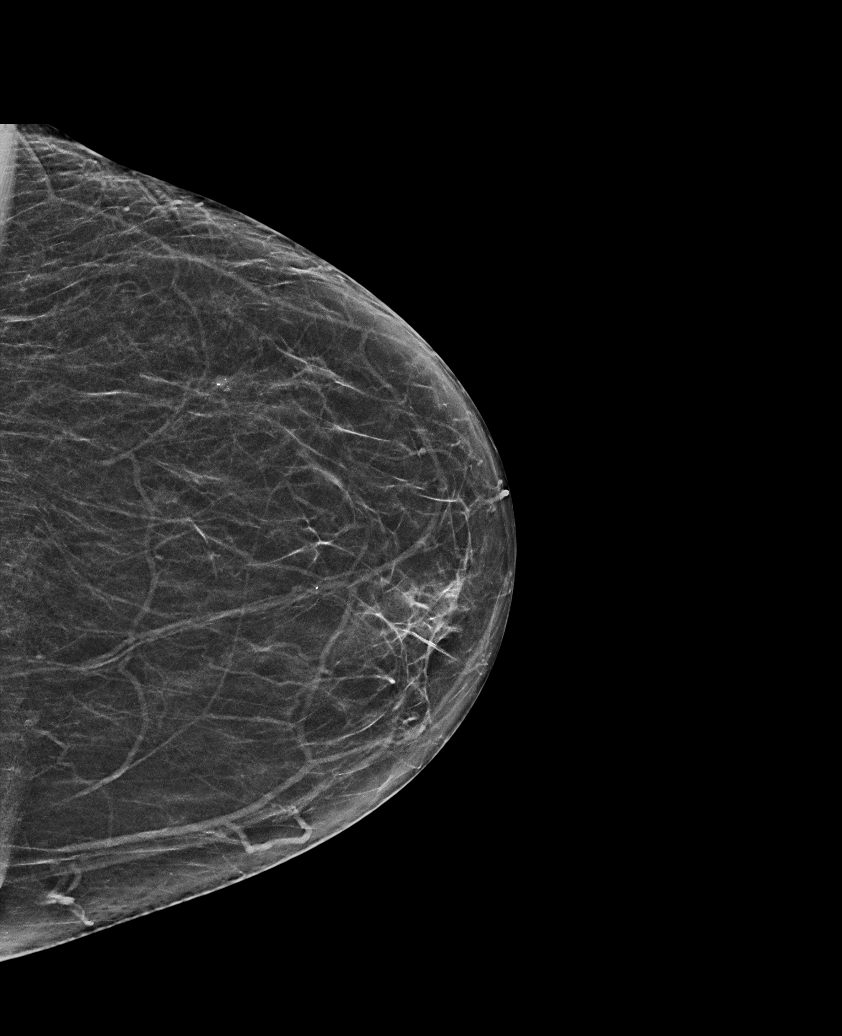

[L CC synth-2D (2 of 2)]
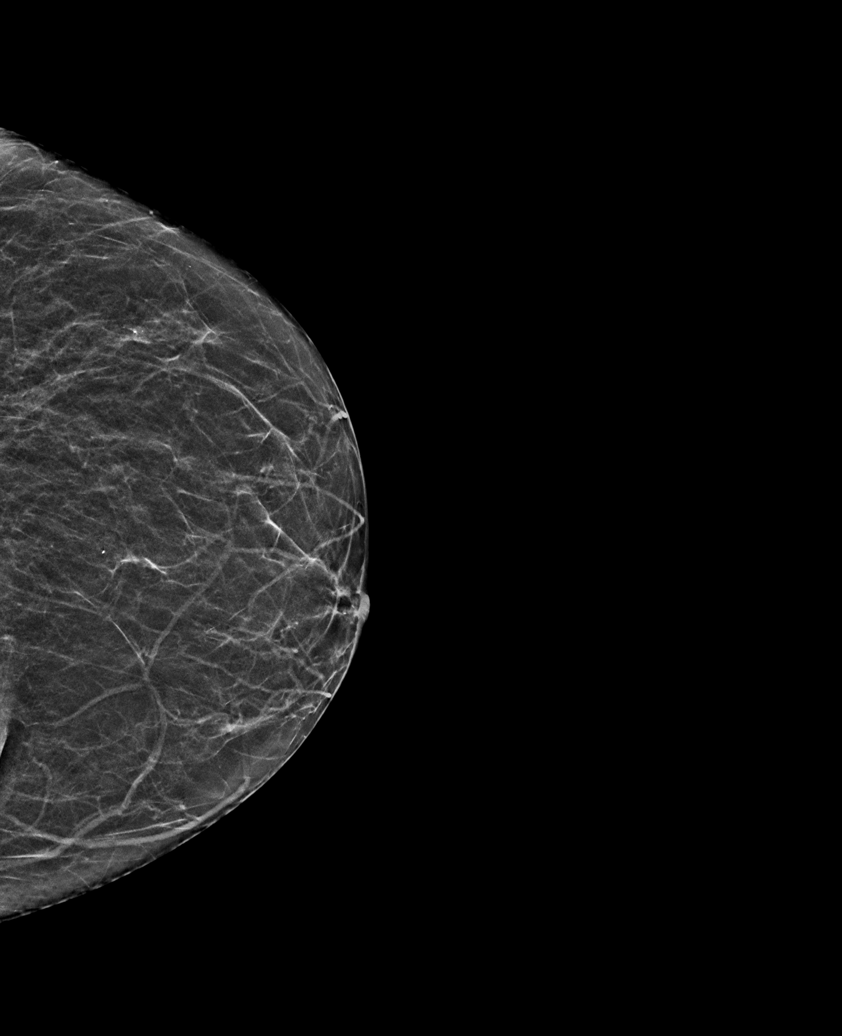

[L XCCL synth-2D]
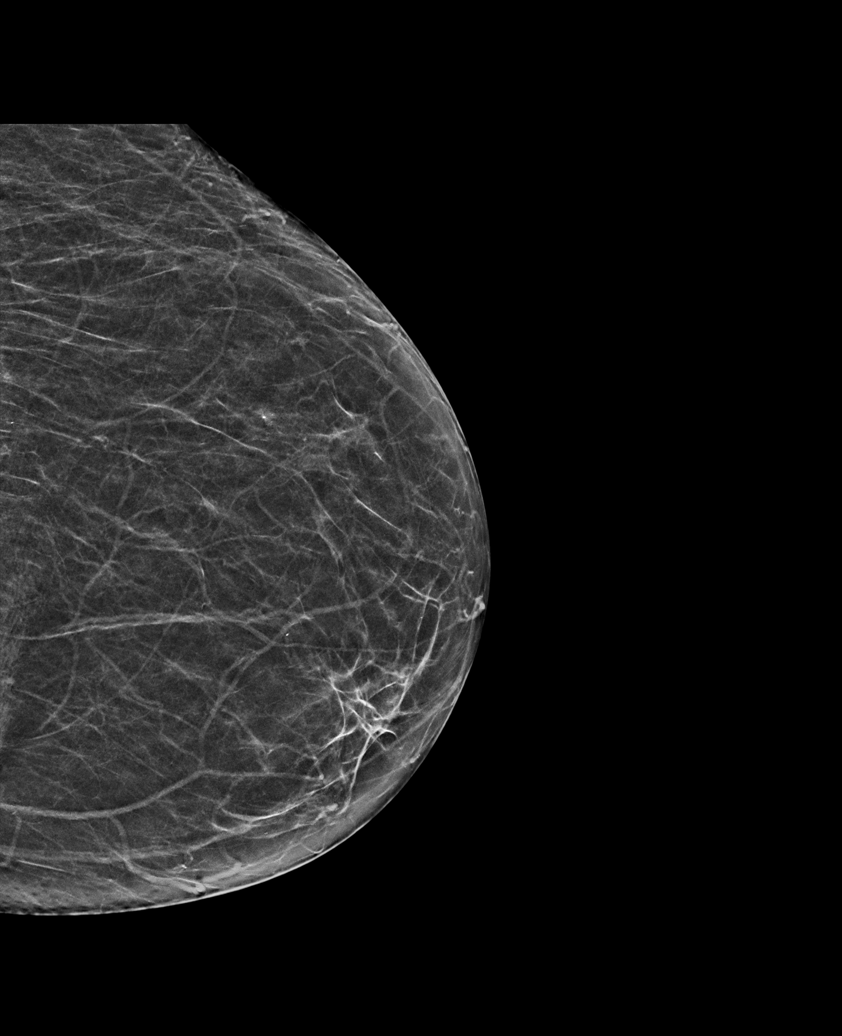

[L MLO synth-2D]
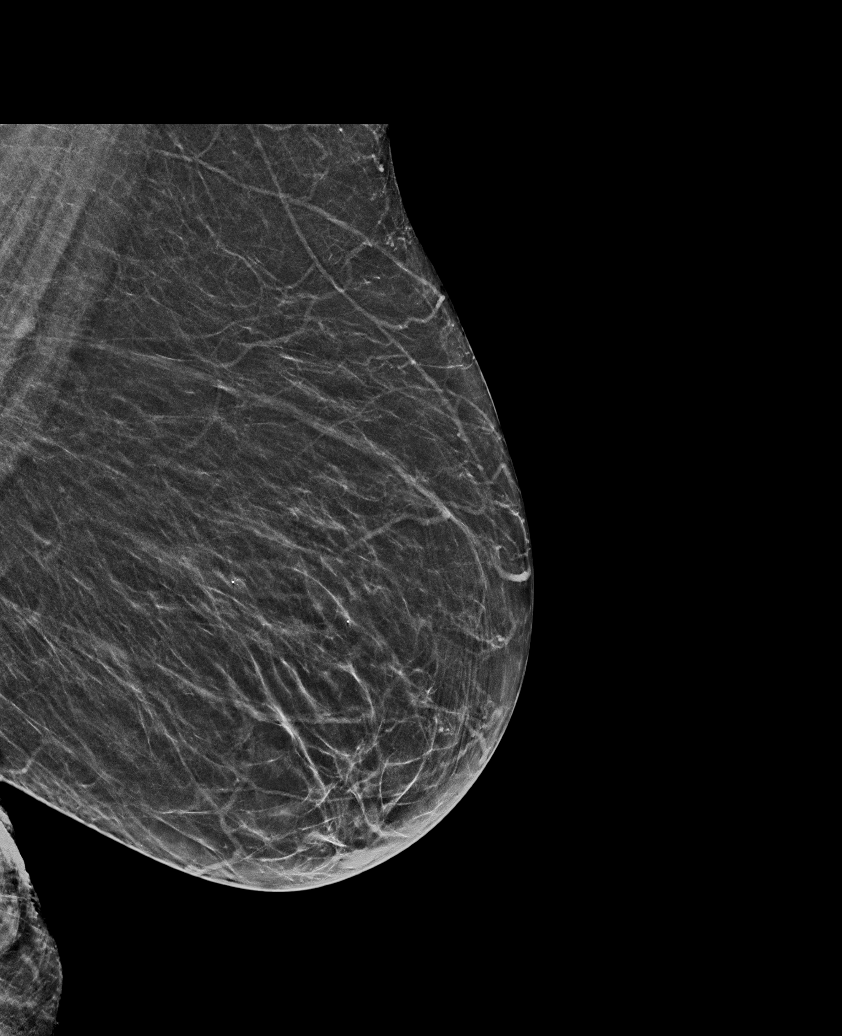

[6 of 36 positions shown; findings below may reference images not displayed]

ACR Breast Density Category b: There are scattered areas of
fibroglandular density.
FINDINGS: There are no findings suspicious for malignancy. Images were
processed with CAD.
IMPRESSION: No mammographic evidence of malignancy. A result letter of this
screening mammogram will be mailed directly to the patient.

RECOMMENDATION:
Screening mammogram in one year. (Code:[id number])

BI-RADS CATEGORY  1: Negative.

## 2020-09-28 DIAGNOSIS — M9901 Segmental and somatic dysfunction of cervical region: Secondary | ICD-10-CM | POA: Diagnosis not present

## 2020-09-28 DIAGNOSIS — M5137 Other intervertebral disc degeneration, lumbosacral region: Secondary | ICD-10-CM | POA: Diagnosis not present

## 2020-09-28 DIAGNOSIS — M9903 Segmental and somatic dysfunction of lumbar region: Secondary | ICD-10-CM | POA: Diagnosis not present

## 2020-09-28 DIAGNOSIS — M9902 Segmental and somatic dysfunction of thoracic region: Secondary | ICD-10-CM | POA: Diagnosis not present

## 2020-10-06 DIAGNOSIS — M542 Cervicalgia: Secondary | ICD-10-CM | POA: Diagnosis not present

## 2020-10-06 DIAGNOSIS — G5602 Carpal tunnel syndrome, left upper limb: Secondary | ICD-10-CM | POA: Diagnosis not present

## 2020-10-12 DIAGNOSIS — M9903 Segmental and somatic dysfunction of lumbar region: Secondary | ICD-10-CM | POA: Diagnosis not present

## 2020-10-12 DIAGNOSIS — M9901 Segmental and somatic dysfunction of cervical region: Secondary | ICD-10-CM | POA: Diagnosis not present

## 2020-10-12 DIAGNOSIS — M5137 Other intervertebral disc degeneration, lumbosacral region: Secondary | ICD-10-CM | POA: Diagnosis not present

## 2020-10-12 DIAGNOSIS — M9902 Segmental and somatic dysfunction of thoracic region: Secondary | ICD-10-CM | POA: Diagnosis not present

## 2020-10-18 DIAGNOSIS — H8110 Benign paroxysmal vertigo, unspecified ear: Secondary | ICD-10-CM | POA: Diagnosis not present

## 2020-10-18 DIAGNOSIS — E063 Autoimmune thyroiditis: Secondary | ICD-10-CM | POA: Diagnosis not present

## 2020-10-18 DIAGNOSIS — J329 Chronic sinusitis, unspecified: Secondary | ICD-10-CM | POA: Diagnosis not present

## 2020-10-18 DIAGNOSIS — R42 Dizziness and giddiness: Secondary | ICD-10-CM | POA: Diagnosis not present

## 2020-10-20 DIAGNOSIS — G5602 Carpal tunnel syndrome, left upper limb: Secondary | ICD-10-CM | POA: Diagnosis not present

## 2020-10-24 DIAGNOSIS — R200 Anesthesia of skin: Secondary | ICD-10-CM | POA: Diagnosis not present

## 2020-10-25 ENCOUNTER — Telehealth: Payer: Self-pay | Admitting: Neurology

## 2020-10-25 NOTE — Telephone Encounter (Signed)
Pt called and LVM stating that she is needing to speak to the RN or provider. Pt did not leave any other information on the VM. Please advise.

## 2020-10-25 NOTE — Telephone Encounter (Signed)
I called the patient back. She has been having neck pain. She is being seen by [facility]. She had x-ray that did not really explain her pain. She is having NCV/EMG next week. She feels like the discomfort may also be coming for her ear (not seen ENT). She is going to complete her work-up there. If they feel she needs further evaluation by neurology, we can schedule her a consult appt w/ Dr. Krista Blue. She will get her records from the ortho office, if the appt here is needed.

## 2020-10-28 DIAGNOSIS — H9201 Otalgia, right ear: Secondary | ICD-10-CM | POA: Diagnosis not present

## 2020-11-01 ENCOUNTER — Ambulatory Visit: Payer: Medicare Other | Attending: Orthopedic Surgery | Admitting: Physical Therapy

## 2020-11-01 ENCOUNTER — Other Ambulatory Visit: Payer: Self-pay

## 2020-11-01 DIAGNOSIS — M542 Cervicalgia: Secondary | ICD-10-CM | POA: Diagnosis not present

## 2020-11-01 DIAGNOSIS — R293 Abnormal posture: Secondary | ICD-10-CM | POA: Diagnosis not present

## 2020-11-01 NOTE — Therapy (Addendum)
[facility] [place], [place], [address] Phone: [phone]   Fax:  [phone]  Physical Therapy Evaluation  Patient Details  Name: Lisa Parker MRN: [id number] Date of Birth: 1946-03-02 Referring Provider (PT): Frederik Pear MD   Encounter Date: 11/01/2020   PT End of Session - 11/01/20 1157     Visit Number 1    Number of Visits 12    Date for PT Re-Evaluation 12/13/20    PT Start Time 0902    PT Stop Time 0950    PT Time Calculation (min) 48 min    Behavior During Therapy [facility] for tasks assessed/performed             Past Medical History:  Diagnosis Date   Anxiety    Arthritis    Asthma    HTN (hypertension)    "i had a gastric sleeve done and lost 100 pounds and i dont have htn anymore, my doctor, Dr Hilma Favors took me off of it "    Hypothyroid    Memory loss    Seasonal allergies    Sinus bradycardia    Urinary tract infection     Past Surgical History:  Procedure Laterality Date   BLEPHAROPLASTY Bilateral    CATARACT EXTRACTION, BILATERAL     with lens    CESAREAN SECTION     FINGER SURGERY     left pinky   HERNIA REPAIR  2004   inguinal , incisional, navel    KNEE SURGERY Left    arthroscopic    LAPAROSCOPIC GASTRIC SLEEVE RESECTION     MASS EXCISION Left 10/20/2015   Procedure: EXCISION MASS LEFT SMALL FINGER;  Surgeon: Leanora Cover, MD;  Location: [facility];  Service: Orthopedics;  Laterality: Left;   TONSILLECTOMY AND ADENOIDECTOMY     TOTAL KNEE ARTHROPLASTY Right 11/24/2018   Procedure: Right Knee Arthroplasty;  Surgeon: Frederik Pear, MD;  Location: [facility];  Service: Orthopedics;  Laterality: Right;   TOTAL KNEE ARTHROPLASTY Left 05/27/2019   Procedure: LEFT TOTAL KNEE ARTHROPLASTY SAMD DAY DISCHARGE;  Surgeon: Frederik Pear, MD;  Location: [facility];  Service: Orthopedics;  Laterality: Left;    There were no vitals filed for this visit.    Subjective Assessment - 11/01/20  1202     Subjective COVID-19 screen performed prior to patient entering clinic.  The patient presents to the clinic today with c/o right sided neck pain that has been ongoing for about a year.  Her pain at rest today is a 2/10 though she states she has a high pain tolerance.  When she moves her neck her pain rises to much higher levels.    Pertinent History OA, HTN, hypothyroidism, hernia repair and knee surgery.    Patient Stated Goals Reduce oain when moving neck and increase range of motion.    Currently in Pain? Yes    Pain Score 2     Pain Location Neck    Pain Orientation Left    Pain Descriptors / Indicators Aching;Sharp    Pain Type Chronic pain    Pain Onset More than a month ago    Pain Frequency Constant    Aggravating Factors  See above.    Pain Relieving Factors See above.                [facility] PT Assessment - 11/01/20 0001       Assessment   Medical Diagnosis Cervical spine pain.    Referring  Provider (PT) Frederik Pear MD    Onset Date/Surgical Date --   ~one year.     Precautions   Precautions None      Restrictions   Weight Bearing Restrictions No      Balance Screen   Has the patient fallen in the past 6 months No    Has the patient had a decrease in activity level because of a fear of falling?  No    Is the patient reluctant to leave their home because of a fear of falling?  No      Home Environment   Living Environment Private residence      Prior Function   Level of Independence Independent      Posture/Postural Control   Posture/Postural Control Postural limitations    Postural Limitations Rounded Shoulders;Forward head      Deep Tendon Reflexes   DTR Assessment Site --   UE DTR's normal with right Tricep decreased (1+/4+).     ROM / Strength   AROM / PROM / Strength AROM;Strength      AROM   Overall AROM Comments Right active cervical rotation is 40 degrees and left is 30 degrees.  WFL for bilateral UE's.      Strength   Overall  Strength Comments Right shoulder abduction and ER is 4/5.      Palpation   Palpation comment Tender to palption over her right cervical region/musculature with a great deal of tone over her right UT.                        Objective measurements completed on examination: See above findings.       [facility] Adult PT Treatment/Exercise - 11/01/20 0001       Modalities   Modalities Electrical Stimulation;Moist Heat      Moist Heat Therapy   Number Minutes Moist Heat 20 Minutes    Moist Heat Location --   RT cervical.     Electrical Stimulation   Electrical Stimulation Location RT cervical.    Electrical Stimulation Action IFC at 80-150 Hz.    Electrical Stimulation Parameters 40% scan x 20 minutes.    Electrical Stimulation Goals Pain;Tone                         PT Long Term Goals - 11/01/20 1216       PT LONG TERM GOAL #1   Time 6    Period Weeks    Status New      PT LONG TERM GOAL #2   Title Increase active cervical rotation to 55 degrees+ so patient can turn head more easily while driving.    Time 6    Period Weeks    Status New      PT LONG TERM GOAL #3   Title Perform ADL's with pain not > 2-3/10.    Time 6    Period Weeks    Status New                    Plan - 11/01/20 1212     Clinical Impression Statement The patient presents to O[facility] with c/o chronic neck pain.  She has a significant loss of active rotation.  She is weak into right shoulder abduction and ER.  She is tender to palpation over her right cervical musculature and has a great deal of tone over her right UT.  Her right  Tricep reflex is decreased per contralateral comparison.  Patient will benefit from skilled physical therapy intervention to address pain and deficits.    Personal Factors and Comorbidities Comorbidity 1;Other;Comorbidity 2    Comorbidities OA, HTN, hypothyroidism, hernia repair and knee surgery.    Examination-Activity Limitations Other     Examination-Participation Restrictions Other    Stability/Clinical Decision Making Stable/Uncomplicated    Rehab Potential Good    PT Frequency 2x / week    PT Duration 6 weeks    PT Treatment/Interventions ADLs/Self Care Home Management;Cryotherapy;Electrical Stimulation;Ultrasound;Moist Heat;Therapeutic activities;Therapeutic exercise;Traction;Manual techniques;Patient/family education;Passive range of motion;Dry needling    PT Next Visit Plan FOTO...Marland KitchenMarland KitchenChin tucks and cervical extension, combo e'stim/US and STW/M, postural exercises.    Consulted and Agree with Plan of Care Patient             Patient will benefit from skilled therapeutic intervention in order to improve the following deficits and impairments:  Pain, Decreased activity tolerance, Decreased range of motion, Decreased strength, Increased muscle spasms, Postural dysfunction  Visit Diagnosis: Cervicalgia - Plan: PT plan of care cert/re-cert  Abnormal posture - Plan: PT plan of care cert/re-cert     Problem List Patient Active Problem List   Diagnosis Date Noted   Gait abnormality 07/06/2019   Degenerative arthritis of left knee 05/26/2019   Total knee replacement status, right 11/24/2018   Osteoarthritis of right knee 11/20/2018   Mild cognitive impairment 10/09/2016   High blood pressure 09/15/2013   Hypothyroidism 09/15/2013   Obesity (BMI 30-39.9) 09/15/2013   S/P arthroscopy of left knee 10/02/2011   Lateral meniscal tear 01/18/2011    Vadis Slabach, [place] MPT 11/01/2020, 3:07 PM  [facility] [address] [place], [place], [address] Phone: [phone]   Fax:  [phone]  Name: Lisa Parker MRN: [id number] Date of Birth: Dec 20, 1945

## 2020-11-02 DIAGNOSIS — G5602 Carpal tunnel syndrome, left upper limb: Secondary | ICD-10-CM | POA: Diagnosis not present

## 2020-11-03 ENCOUNTER — Other Ambulatory Visit: Payer: Self-pay

## 2020-11-03 ENCOUNTER — Ambulatory Visit: Payer: Medicare Other | Admitting: Physical Therapy

## 2020-11-03 ENCOUNTER — Encounter: Payer: Self-pay | Admitting: Physical Therapy

## 2020-11-03 DIAGNOSIS — R293 Abnormal posture: Secondary | ICD-10-CM | POA: Diagnosis not present

## 2020-11-03 DIAGNOSIS — M542 Cervicalgia: Secondary | ICD-10-CM | POA: Diagnosis not present

## 2020-11-03 DIAGNOSIS — E039 Hypothyroidism, unspecified: Secondary | ICD-10-CM | POA: Diagnosis not present

## 2020-11-03 NOTE — Therapy (Signed)
[facility] [place], [place], [address] Phone: [phone]   Fax:  [phone]  Physical Therapy Treatment  Patient Details  Name: Lisa Parker MRN: [id number] Date of Birth: 04-18-1946 Referring Provider (PT): Frederik Pear MD   Encounter Date: 11/03/2020   PT End of Session - 11/03/20 0955     Visit Number 2    Number of Visits 12    Date for PT Re-Evaluation 12/13/20    PT Start Time 0956    PT Stop Time 1032    PT Time Calculation (min) 36 min    Activity Tolerance Patient tolerated treatment well    Behavior During Therapy [facility] for tasks assessed/performed             Past Medical History:  Diagnosis Date   Anxiety    Arthritis    Asthma    HTN (hypertension)    "i had a gastric sleeve done and lost 100 pounds and i dont have htn anymore, my doctor, Dr Hilma Favors took me off of it "    Hypothyroid    Memory loss    Seasonal allergies    Sinus bradycardia    Urinary tract infection     Past Surgical History:  Procedure Laterality Date   BLEPHAROPLASTY Bilateral    CATARACT EXTRACTION, BILATERAL     with lens    CESAREAN SECTION     FINGER SURGERY     left pinky   HERNIA REPAIR  2004   inguinal , incisional, navel    KNEE SURGERY Left    arthroscopic    LAPAROSCOPIC GASTRIC SLEEVE RESECTION     MASS EXCISION Left 10/20/2015   Procedure: EXCISION MASS LEFT SMALL FINGER;  Surgeon: Leanora Cover, MD;  Location: [place];  Service: Orthopedics;  Laterality: Left;   TONSILLECTOMY AND ADENOIDECTOMY     TOTAL KNEE ARTHROPLASTY Right 11/24/2018   Procedure: Right Knee Arthroplasty;  Surgeon: Frederik Pear, MD;  Location: [facility];  Service: Orthopedics;  Laterality: Right;   TOTAL KNEE ARTHROPLASTY Left 05/27/2019   Procedure: LEFT TOTAL KNEE ARTHROPLASTY SAMD DAY DISCHARGE;  Surgeon: Frederik Pear, MD;  Location: [facility];  Service: Orthopedics;  Laterality: Left;    There were no vitals filed  for this visit.   Subjective Assessment - 11/03/20 0952     Subjective COVID-19 screen performed prior to patient entering clinic. Reports fluid in R ear for a while in which she has been treated for several weeks. Reports a pump behind R ear as well. Reports cracking in cervical spine and R UT pain.    Pertinent History OA, HTN, hypothyroidism, hernia repair and knee surgery.    Patient Stated Goals Reduce oain when moving neck and increase range of motion.    Currently in Pain? Yes    Pain Score 6     Pain Location Neck    Pain Orientation Right    Pain Descriptors / Indicators Sore    Pain Type Chronic pain    Pain Onset More than a month ago    Pain Frequency Intermittent                [facility] PT Assessment - 11/03/20 0001       Assessment   Medical Diagnosis Cervical spine pain.    Referring Provider (PT) Frederik Pear MD      Precautions   Precautions None      Restrictions   Weight Bearing Restrictions No  [facility] Adult PT Treatment/Exercise - 11/03/20 0001       Modalities   Modalities Electrical Stimulation;Ultrasound      Acupuncturist Location R UT, scapula    Electrical Stimulation Action IFC    Electrical Stimulation Parameters 80-150 hz x10 min    Electrical Stimulation Goals Pain;Tone      Ultrasound   Ultrasound Location R UT    Ultrasound Parameters Combo 1.5 w/cm2, 100%, 1 mhz x10 min    Ultrasound Goals Pain      Manual Therapy   Manual Therapy Soft tissue mobilization    Soft tissue mobilization STW to R UT, cervical paraspinals, rhomboids to reduce pain and tone                         PT Long Term Goals - 11/01/20 1216       PT LONG TERM GOAL #1   Time 6    Period Weeks    Status New      PT LONG TERM GOAL #2   Title Increase active cervical rotation to 55 degrees+ so patient can turn head more easily while driving.    Time 6    Period  Weeks    Status New      PT LONG TERM GOAL #3   Title Perform ADL's with pain not > 2-3/10.    Time 6    Period Weeks    Status New                   Plan - 11/03/20 1045     Clinical Impression Statement Patient presented in clinic with reports of R neck pain but also of the R ear condition that has plagued her as well. Patient did present with increased tone of R UT in superior fibers. No complaints of any increased pain. Normal modalities response noted following removal of the modalities. Patient reported less muscle tension upon end of session.    Personal Factors and Comorbidities Comorbidity 1;Other;Comorbidity 2    Comorbidities OA, HTN, hypothyroidism, hernia repair and knee surgery.    Examination-Activity Limitations Other    Examination-Participation Restrictions Other    Stability/Clinical Decision Making Stable/Uncomplicated    Rehab Potential Good    PT Frequency 2x / week    PT Duration 6 weeks    PT Treatment/Interventions ADLs/Self Care Home Management;Cryotherapy;Electrical Stimulation;Ultrasound;Moist Heat;Therapeutic activities;Therapeutic exercise;Traction;Manual techniques;Patient/family education;Passive range of motion;Dry needling    PT Next Visit Plan FOTO...Marland KitchenMarland KitchenChin tucks and cervical extension, combo e'stim/US and STW/M, postural exercises.    Consulted and Agree with Plan of Care Patient             Patient will benefit from skilled therapeutic intervention in order to improve the following deficits and impairments:  Pain, Decreased activity tolerance, Decreased range of motion, Decreased strength, Increased muscle spasms, Postural dysfunction  Visit Diagnosis: Cervicalgia  Abnormal posture     Problem List Patient Active Problem List   Diagnosis Date Noted   Gait abnormality 07/06/2019   Degenerative arthritis of left knee 05/26/2019   Total knee replacement status, right 11/24/2018   Osteoarthritis of right knee 11/20/2018   Mild  cognitive impairment 10/09/2016   High blood pressure 09/15/2013   Hypothyroidism 09/15/2013   Obesity (BMI 30-39.9) 09/15/2013   S/P arthroscopy of left knee 10/02/2011   Lateral meniscal tear 01/18/2011    Standley Brooking, PTA 11/03/2020, 11:20 AM  [facility] [place], [place], [address] Phone: [phone]   Fax:  [phone]  Name: Lisa Parker MRN: [id number] Date of Birth: [birth year]

## 2020-11-08 ENCOUNTER — Ambulatory Visit: Payer: Medicare Other | Attending: Orthopedic Surgery | Admitting: Physical Therapy

## 2020-11-08 ENCOUNTER — Other Ambulatory Visit: Payer: Self-pay

## 2020-11-08 DIAGNOSIS — M6281 Muscle weakness (generalized): Secondary | ICD-10-CM | POA: Insufficient documentation

## 2020-11-08 DIAGNOSIS — M542 Cervicalgia: Secondary | ICD-10-CM | POA: Insufficient documentation

## 2020-11-08 DIAGNOSIS — R293 Abnormal posture: Secondary | ICD-10-CM | POA: Diagnosis not present

## 2020-11-08 NOTE — Therapy (Signed)
[facility] [place], [place], [address] Phone: [phone]   Fax:  [phone]  Physical Therapy Treatment  Patient Details  Name: Lisa Parker MRN: [id number] Date of Birth: [birth year] Referring Provider (PT): Frederik Pear MD   Encounter Date: 11/08/2020   PT End of Session - 11/08/20 0944     Visit Number 3    Number of Visits 12    PT Start Time 0900    PT Stop Time 0953    PT Time Calculation (min) 53 min    Behavior During Therapy [facility] for tasks assessed/performed             Past Medical History:  Diagnosis Date   Anxiety    Arthritis    Asthma    HTN (hypertension)    "i had a gastric sleeve done and lost 100 pounds and i dont have htn anymore, my doctor, Dr Hilma Favors took me off of it "    Hypothyroid    Memory loss    Seasonal allergies    Sinus bradycardia    Urinary tract infection     Past Surgical History:  Procedure Laterality Date   BLEPHAROPLASTY Bilateral    CATARACT EXTRACTION, BILATERAL     with lens    CESAREAN SECTION     FINGER SURGERY     left pinky   HERNIA REPAIR  2004   inguinal , incisional, navel    KNEE SURGERY Left    arthroscopic    LAPAROSCOPIC GASTRIC SLEEVE RESECTION     MASS EXCISION Left 10/20/2015   Procedure: EXCISION MASS LEFT SMALL FINGER;  Surgeon: Leanora Cover, MD;  Location: [place];  Service: Orthopedics;  Laterality: Left;   TONSILLECTOMY AND ADENOIDECTOMY     TOTAL KNEE ARTHROPLASTY Right 11/24/2018   Procedure: Right Knee Arthroplasty;  Surgeon: Frederik Pear, MD;  Location: [facility];  Service: Orthopedics;  Laterality: Right;   TOTAL KNEE ARTHROPLASTY Left 05/27/2019   Procedure: LEFT TOTAL KNEE ARTHROPLASTY SAMD DAY DISCHARGE;  Surgeon: Frederik Pear, MD;  Location[facility];  Service: Orthopedics;  Laterality: Left;    There were no vitals filed for this visit.   Subjective Assessment - 11/08/20 0938     Subjective COVID-19 screen  performed prior to patient entering clinic.  Right sided headache yesterday.  Been doing the neck exercise.    Pertinent History OA, HTN, hypothyroidism, hernia repair and knee surgery.    Patient Stated Goals Reduce oain when moving neck and increase range of motion.    Currently in Pain? Yes    Pain Location Neck    Pain Orientation Right    Pain Descriptors / Indicators Sore    Pain Onset More than a month ago                               [facility] Adult PT Treatment/Exercise - 11/08/20 0001       Modalities   Modalities Electrical Stimulation;Moist Heat;Ultrasound      Moist Heat Therapy   Number Minutes Moist Heat 20 Minutes    Moist Heat Location --   RT cervical region.     Acupuncturist Location RT cervical region.    Electrical Stimulation Action IFC    Electrical Stimulation Parameters 80-150 Hz. at 40% scan x 20 minutes.      Ultrasound   Ultrasound Location RT Cervical/UT  Ultrasound Parameters Combo e'stim/US at 1.50 W/CM2 x 12 minutes.      Manual Therapy   Manual Therapy Soft tissue mobilization    Soft tissue mobilization STW/M to patient's right suboccipital region, SCM attachment on mastoid process and UT including ischemic release technique x 12 minutes.                         PT Long Term Goals - 11/01/20 1216       PT LONG TERM GOAL #1   Time 6    Period Weeks    Status New      PT LONG TERM GOAL #2   Title Increase active cervical rotation to 55 degrees+ so patient can turn head more easily while driving.    Time 6    Period Weeks    Status New      PT LONG TERM GOAL #3   Title Perform ADL's with pain not > 2-3/10.    Time 6    Period Weeks    Status New                   Plan - 11/08/20 8003     Clinical Impression Statement Patient tender over right subboccipital and SCM attachment on mastoid process as well as her right UT which continues to have a trigger  point.  She is considering dry needling and a consent form was provided to her today.    Comorbidities OA, HTN, hypothyroidism, hernia repair and knee surgery.    Examination-Activity Limitations Other    Examination-Participation Restrictions Other    Stability/Clinical Decision Making Stable/Uncomplicated    Rehab Potential Good    PT Frequency 2x / week    PT Treatment/Interventions ADLs/Self Care Home Management;Cryotherapy;Electrical Stimulation;Ultrasound;Moist Heat;Therapeutic activities;Therapeutic exercise;Traction;Manual techniques;Patient/family education;Passive range of motion;Dry needling    PT Next Visit Plan FOTO...Marland KitchenMarland KitchenChin tucks and cervical extension, combo e'stim/US and STW/M, postural exercises.    Consulted and Agree with Plan of Care Patient             Patient will benefit from skilled therapeutic intervention in order to improve the following deficits and impairments:  Pain, Decreased activity tolerance, Decreased range of motion, Decreased strength, Increased muscle spasms, Postural dysfunction  Visit Diagnosis: Cervicalgia  Abnormal posture     Problem List Patient Active Problem List   Diagnosis Date Noted   Gait abnormality 07/06/2019   Degenerative arthritis of left knee 05/26/2019   Total knee replacement status, right 11/24/2018   Osteoarthritis of right knee 11/20/2018   Mild cognitive impairment 10/09/2016   High blood pressure 09/15/2013   Hypothyroidism 09/15/2013   Obesity (BMI 30-39.9) 09/15/2013   S/P arthroscopy of left knee 10/02/2011   Lateral meniscal tear 01/18/2011    Lisa Parker, [place] MPT 11/08/2020, 10:21 AM  [facility] [address] [place], [place], [address] Phone: [phone]   Fax:  [phone]  Name: Lisa Parker MRN: [id number] Date of Birth: Feb 01, 1946

## 2020-11-08 NOTE — Patient Instructions (Signed)
[facility]   DRY NEEDLING CONSENT FORM   Trigger point dry needling is a physical therapy approach to treat Myofascial Pain and Dysfunction.  Dry Needling (DN) is a valuable and effective way to deactivate myofascial trigger points (muscle knots/pain). It is skilled intervention that uses a thin filiform needle to penetrate the skin and stimulate underlying myofascial trigger points, muscular, and connective tissues for the management of neuromusculoskeletal pain and movement impairments.  A local twitch response (LTR) will be elicited.  This can sometimes feel like a deep ache in the muscle during the procedure. Multiple trigger points in multiple muscles can be treated during each treatment.  No medication of any kind is injected.   As with any medical treatment and procedure, there are possible adverse events.  While significant adverse events are uncommon, they do sometimes occur and must be considered prior to giving consent.  Dry needling often causes a "post needling soreness".  There can be an increase in pain from a couple of hours to 2-3 days, followed by an improvement in the overall pain state. Any time a needle is used there is a risk of infection.  However, we are using new, sterile, and disposable needles; infections are extremely rare. There is a possibility that you may bleed or bruise.  You may feel tired and some nausea following treatment. There is a rare possibility of a pneumothorax (air in the chest cavity). Allergic reaction to nickel in the stainless steel needle. If a nerve is touched, it may cause paresthesia (a prickling/shock sensation) which is usually brief, but may continue for a couple of days.  Following treatment stay hydrated.  Continue regular activities but not too vigorous initially after treatment for 24-48 hours.  Dry Needling is best when combined with other physical therapy interventions such as strengthening, stretching  and other therapeutic modalities.   PLEASE ANSWER THE FOLLOWING QUESTIONS:  Do you have a lack of sensation?   Y/N  Do you have a phobia or fear of needles  Y/N  Are you pregnant?    Y/N If yes:  How many weeks? __________ Do you have any implanted devices?  Y/N If yes:  Pacemaker/Spinal Cord Stimulator/Deep Brain Stimulator/Insulin Pump/Other: ________________ Do you have any implants?  Y/N If yes: Breast/Facial/Pecs/Buttocks/Calves/Hip  Replacement/ Knee Replacement/Other: _________ Do you take any blood thinners?   Y/N If yes: Coumadin (Warfarin)/Other: ___________________ Do you have a bleeding disorder?   Y/N If yes: What kind: _________________________________ Do you take any immunosuppressants?  Y/N If yes:   What kind: _________________________________ Do you take anti-inflammatories?   Y/N If yes: What kind: Advil/Aspirin/Other: ________________ Have you ever been diagnosed with Scoliosis? Y/N Have you had back surgery?   Y/N If yes:  Laminectomy/Fusion/Other: ___________________   I have read, or had read to me, the above.  I have had the opportunity to ask any questions.  All of my questions have been answered to my satisfaction and I understand the risks involved with dry needling.  I consent to examination and treatment at [facility], including dry needling, of any and all of my involved and affected muscles.     Signature: __________________________________     Date:___________________________________________________     

## 2020-11-10 DIAGNOSIS — G5602 Carpal tunnel syndrome, left upper limb: Secondary | ICD-10-CM | POA: Diagnosis not present

## 2020-11-15 ENCOUNTER — Encounter: Payer: Self-pay | Admitting: Physical Therapy

## 2020-11-15 ENCOUNTER — Ambulatory Visit: Payer: Medicare Other | Admitting: Physical Therapy

## 2020-11-15 ENCOUNTER — Other Ambulatory Visit: Payer: Self-pay

## 2020-11-15 DIAGNOSIS — R293 Abnormal posture: Secondary | ICD-10-CM | POA: Diagnosis not present

## 2020-11-15 DIAGNOSIS — M6281 Muscle weakness (generalized): Secondary | ICD-10-CM | POA: Diagnosis not present

## 2020-11-15 DIAGNOSIS — M542 Cervicalgia: Secondary | ICD-10-CM

## 2020-11-15 NOTE — Therapy (Signed)
[facility] [place], [place], [address] Phone: [phone]   Fax:  [phone]  Physical Therapy Treatment  Patient Details  Name: Lisa Parker MRN: [id number] Date of Birth: 1946/03/11 Referring Provider (PT): Frederik Pear MD   Encounter Date: 11/15/2020   PT End of Session - 11/15/20 0943     Visit Number 4    Number of Visits 12    Date for PT Re-Evaluation 12/13/20    PT Start Time 0949    PT Stop Time 1034    PT Time Calculation (min) 45 min    Activity Tolerance Patient tolerated treatment well    Behavior During Therapy [facility] for tasks assessed/performed             Past Medical History:  Diagnosis Date   Anxiety    Arthritis    Asthma    HTN (hypertension)    "i had a gastric sleeve done and lost 100 pounds and i dont have htn anymore, my doctor, Dr Hilma Favors took me off of it "    Hypothyroid    Memory loss    Seasonal allergies    Sinus bradycardia    Urinary tract infection     Past Surgical History:  Procedure Laterality Date   BLEPHAROPLASTY Bilateral    CATARACT EXTRACTION, BILATERAL     with lens    CESAREAN SECTION     FINGER SURGERY     left pinky   HERNIA REPAIR  2004   inguinal , incisional, navel    KNEE SURGERY Left    arthroscopic    LAPAROSCOPIC GASTRIC SLEEVE RESECTION     MASS EXCISION Left 10/20/2015   Procedure: EXCISION MASS LEFT SMALL FINGER;  Surgeon: Leanora Cover, MD;  Location: [place];  Service: Orthopedics;  Laterality: Left;   TONSILLECTOMY AND ADENOIDECTOMY     TOTAL KNEE ARTHROPLASTY Right 11/24/2018   Procedure: Right Knee Arthroplasty;  Surgeon: Frederik Pear, MD;  Location[facility];  Service: Orthopedics;  Laterality: Right;   TOTAL KNEE ARTHROPLASTY Left 05/27/2019   Procedure: LEFT TOTAL KNEE ARTHROPLASTY SAMD DAY DISCHARGE;  Surgeon: Frederik Pear, MD;  Location[facility];  Service: Orthopedics;  Laterality: Left;    There were no vitals filed  for this visit.   Subjective Assessment - 11/15/20 0943     Subjective COVID-19 screen performed prior to patient entering clinic. Was doing good until she fell Sunday at a friend's house in the driveway. Reports soreness after fall but also more soreness at the occipital protuberance on R side. Sees ENT on 12/13/2020.    Pertinent History OA, HTN, hypothyroidism, hernia repair and knee surgery.    Patient Stated Goals Reduce oain when moving neck and increase range of motion.    Currently in Pain? Yes    Pain Score 6     Pain Location Neck    Pain Orientation Right    Pain Descriptors / Indicators Sore;Discomfort    Pain Type Chronic pain    Pain Onset More than a month ago    Pain Frequency Intermittent                [facility] PT Assessment - 11/15/20 0001       Assessment   Medical Diagnosis Cervical spine pain.    Referring Provider (PT) Frederik Pear MD      Precautions   Precautions None      Restrictions   Weight Bearing Restrictions No  [facility] Adult PT Treatment/Exercise - 11/15/20 0001       Modalities   Modalities Electrical Stimulation;Ultrasound      Acupuncturist Location R UT    Electrical Stimulation Action IFC    Electrical Stimulation Parameters 80-150 hz x15 min    Electrical Stimulation Goals Pain;Tone      Ultrasound   Ultrasound Location R UT, cervical paraspinals    Ultrasound Parameters Combo 1.5 w/cm2, 100%, 1 mhz x10 min    Ultrasound Goals Pain      Manual Therapy   Manual Therapy Soft tissue mobilization    Soft tissue mobilization STW to R cervical paraspinals, UT, levator scapula to reduce tone                         PT Long Term Goals - 11/01/20 1216       PT LONG TERM GOAL #1   Time 6    Period Weeks    Status New      PT LONG TERM GOAL #2   Title Increase active cervical rotation to 55 degrees+ so patient can turn head more easily  while driving.    Time 6    Period Weeks    Status New      PT LONG TERM GOAL #3   Title Perform ADL's with pain not > 2-3/10.    Time 6    Period Weeks    Status New                   Plan - 11/15/20 1023     Clinical Impression Statement Patient presented in clinic with reports of more R cervical tone/discomfort and soreness at R occipital protuberance. Patient presented with increased tone in mid R UT and patient stated that she could feel sensation behind R ear with pressure to mid UT. Patient to see ENT in August 2022 due to persistant fluid in R ear. Normal modalities response noted following removal of the modalities.    Personal Factors and Comorbidities Comorbidity 1;Other;Comorbidity 2    Comorbidities OA, HTN, hypothyroidism, hernia repair and knee surgery.    Examination-Activity Limitations Other    Examination-Participation Restrictions Other    Stability/Clinical Decision Making Stable/Uncomplicated    Rehab Potential Good    PT Frequency 2x / week    PT Duration 6 weeks    PT Treatment/Interventions ADLs/Self Care Home Management;Cryotherapy;Electrical Stimulation;Ultrasound;Moist Heat;Therapeutic activities;Therapeutic exercise;Traction;Manual techniques;Patient/family education;Passive range of motion;Dry needling    PT Next Visit Plan FOTO...Marland KitchenMarland KitchenChin tucks and cervical extension, combo e'stim/US and STW/M, postural exercises.    Consulted and Agree with Plan of Care Patient             Patient will benefit from skilled therapeutic intervention in order to improve the following deficits and impairments:  Pain, Decreased activity tolerance, Decreased range of motion, Decreased strength, Increased muscle spasms, Postural dysfunction  Visit Diagnosis: Cervicalgia  Abnormal posture  Muscle weakness (generalized)     Problem List Patient Active Problem List   Diagnosis Date Noted   Gait abnormality 07/06/2019   Degenerative arthritis of left knee  05/26/2019   Total knee replacement status, right 11/24/2018   Osteoarthritis of right knee 11/20/2018   Mild cognitive impairment 10/09/2016   High blood pressure 09/15/2013   Hypothyroidism 09/15/2013   Obesity (BMI 30-39.9) 09/15/2013   S/P arthroscopy of left knee 10/02/2011   Lateral meniscal tear 01/18/2011    Merleen Nicely  Deloris Ping, PTA 11/15/2020, 10:42 AM  [facility] [address] [place], [place], [address] Phone: [phone]   Fax:  [phone]  Name: Lisa Parker MRN: [id number] Date of Birth: Jan 25, 1946

## 2020-11-18 ENCOUNTER — Encounter: Payer: Medicare Other | Admitting: Physical Therapy

## 2020-11-22 ENCOUNTER — Other Ambulatory Visit: Payer: Self-pay

## 2020-11-22 ENCOUNTER — Encounter: Payer: Self-pay | Admitting: Physical Therapy

## 2020-11-22 ENCOUNTER — Ambulatory Visit: Payer: Medicare Other | Admitting: Physical Therapy

## 2020-11-22 DIAGNOSIS — M542 Cervicalgia: Secondary | ICD-10-CM | POA: Diagnosis not present

## 2020-11-22 DIAGNOSIS — R293 Abnormal posture: Secondary | ICD-10-CM

## 2020-11-22 DIAGNOSIS — M6281 Muscle weakness (generalized): Secondary | ICD-10-CM | POA: Diagnosis not present

## 2020-11-22 NOTE — Therapy (Signed)
[facility] [place], [place], [address] Phone: [phone]   Fax:  [phone]  Physical Therapy Treatment  Patient Details  Name: Lisa Parker MRN: [id number] Date of Birth: [birth year] Referring Provider (PT): Frederik Pear MD   Encounter Date: 11/22/2020   PT End of Session - 11/22/20 1130     Visit Number 5    Number of Visits 12    Date for PT Re-Evaluation 12/13/20    PT Start Time 0953    PT Stop Time 1033    PT Time Calculation (min) 40 min    Activity Tolerance Patient tolerated treatment well    Behavior During Therapy [facility] for tasks assessed/performed             Past Medical History:  Diagnosis Date   Anxiety    Arthritis    Asthma    HTN (hypertension)    "i had a gastric sleeve done and lost 100 pounds and i dont have htn anymore, my doctor, Dr Hilma Favors took me off of it "    Hypothyroid    Memory loss    Seasonal allergies    Sinus bradycardia    Urinary tract infection     Past Surgical History:  Procedure Laterality Date   BLEPHAROPLASTY Bilateral    CATARACT EXTRACTION, BILATERAL     with lens    CESAREAN SECTION     FINGER SURGERY     left pinky   HERNIA REPAIR  2004   inguinal , incisional, navel    KNEE SURGERY Left    arthroscopic    LAPAROSCOPIC GASTRIC SLEEVE RESECTION     MASS EXCISION Left 10/20/2015   Procedure: EXCISION MASS LEFT SMALL FINGER;  Surgeon: Leanora Cover, MD;  Location: [place];  Service: Orthopedics;  Laterality: Left;   TONSILLECTOMY AND ADENOIDECTOMY     TOTAL KNEE ARTHROPLASTY Right 11/24/2018   Procedure: Right Knee Arthroplasty;  Surgeon: Frederik Pear, MD;  [facility];  Service: Orthopedics;  Laterality: Right;   TOTAL KNEE ARTHROPLASTY Left 05/27/2019   Procedure: LEFT TOTAL KNEE ARTHROPLASTY SAMD DAY DISCHARGE;  Surgeon: Frederik Pear, MD;  [facility];  Service: Orthopedics;  Laterality: Left;    There were no vitals filed  for this visit.   Subjective Assessment - 11/22/20 0945     Subjective COVID-19 screen performed prior to patient entering clinic. More pain after rescheduling an appointment last week. Taken three tylenol due to pain.    Pertinent History OA, HTN, hypothyroidism, hernia repair and knee surgery.    Patient Stated Goals Reduce oain when moving neck and increase range of motion.    Currently in Pain? Yes    Pain Score 7     Pain Location Neck    Pain Orientation Right    Pain Descriptors / Indicators Discomfort    Pain Type Chronic pain    Pain Onset More than a month ago    Pain Frequency Constant                [facility] PT Assessment - 11/22/20 0001       Assessment   Medical Diagnosis Cervical spine pain.    Referring Provider (PT) Frederik Pear MD      Precautions   Precautions None                           [facility] Adult PT Treatment/Exercise - 11/22/20  0001       Modalities   Modalities Electrical Stimulation;Ultrasound      Theme park manager R UT    Electrical Stimulation Action Pre-Mod    Electrical Stimulation Parameters 80-150 hz x10 min    Electrical Stimulation Goals Pain;Tone      Ultrasound   Ultrasound Location R UT    Ultrasound Parameters Combo 1.5 w/cm2, 100%, 1 mhz x10 min    Ultrasound Goals Pain      Manual Therapy   Manual Therapy Soft tissue mobilization    Soft tissue mobilization STW to R cervical paraspinals, UT, levator scapula to reduce tone                         PT Long Term Goals - 11/01/20 1216       PT LONG TERM GOAL #1   Time 6    Period Weeks    Status New      PT LONG TERM GOAL #2   Title Increase active cervical rotation to 55 degrees+ so patient can turn head more easily while driving.    Time 6    Period Weeks    Status New      PT LONG TERM GOAL #3   Title Perform ADL's with pain not > 2-3/10.    Time 6    Period Weeks    Status New                    Plan - 11/22/20 1207     Clinical Impression Statement Patient arrived with increased R cervical pain and tone. Patient reported a limitation of cervical extension due to tone and pain. Patient still awaiting ENT appointment but no vertigo symptoms reported. Tone palpable especially into UT, levator scapula. Normal modalities response noted following removal of the modalities. Patient reported improved cervical extension and mobility after end of treatment.    Personal Factors and Comorbidities Comorbidity 1;Other;Comorbidity 2    Comorbidities OA, HTN, hypothyroidism, hernia repair and knee surgery.    Examination-Activity Limitations Other    Examination-Participation Restrictions Other    Stability/Clinical Decision Making Stable/Uncomplicated    Rehab Potential Good    PT Frequency 2x / week    PT Duration 6 weeks    PT Treatment/Interventions ADLs/Self Care Home Management;Cryotherapy;Electrical Stimulation;Ultrasound;Moist Heat;Therapeutic activities;Therapeutic exercise;Traction;Manual techniques;Patient/family education;Passive range of motion;Dry needling    PT Next Visit Plan Chin tucks and cervical extension, combo e'stim/US and STW/M, postural exercises.    Consulted and Agree with Plan of Care Patient             Patient will benefit from skilled therapeutic intervention in order to improve the following deficits and impairments:  Pain, Decreased activity tolerance, Decreased range of motion, Decreased strength, Increased muscle spasms, Postural dysfunction  Visit Diagnosis: Cervicalgia  Abnormal posture  Muscle weakness (generalized)     Problem List Patient Active Problem List   Diagnosis Date Noted   Gait abnormality 07/06/2019   Degenerative arthritis of left knee 05/26/2019   Total knee replacement status, right 11/24/2018   Osteoarthritis of right knee 11/20/2018   Mild cognitive impairment 10/09/2016   High blood pressure 09/15/2013    Hypothyroidism 09/15/2013   Obesity (BMI 30-39.9) 09/15/2013   S/P arthroscopy of left knee 10/02/2011   Lateral meniscal tear 01/18/2011    Standley Brooking, PTA 11/22/2020, 12:15 PM  [facility] [place]  [place], [place], [address] Phone: [phone]   Fax:  [phone]  Name: Lisa Parker MRN: [id number] Date of Birth: [birth year]

## 2020-11-24 ENCOUNTER — Other Ambulatory Visit: Payer: Self-pay

## 2020-11-24 ENCOUNTER — Ambulatory Visit: Payer: Medicare Other | Admitting: Physical Therapy

## 2020-11-24 ENCOUNTER — Encounter: Payer: Self-pay | Admitting: Physical Therapy

## 2020-11-24 DIAGNOSIS — R293 Abnormal posture: Secondary | ICD-10-CM | POA: Diagnosis not present

## 2020-11-24 DIAGNOSIS — M542 Cervicalgia: Secondary | ICD-10-CM | POA: Diagnosis not present

## 2020-11-24 DIAGNOSIS — M6281 Muscle weakness (generalized): Secondary | ICD-10-CM | POA: Diagnosis not present

## 2020-11-24 NOTE — Therapy (Signed)
[facility] [place], [place], [address] Phone: [phone]   Fax:  [phone]  Physical Therapy Treatment  Patient Details  Name: Lisa Parker MRN: [id number] Date of Birth: [birth year] Referring Provider (PT): Frederik Pear MD   Encounter Date: 11/24/2020   PT End of Session - 11/24/20 0937     Visit Number 6    Number of Visits 12    Date for PT Re-Evaluation 12/13/20    PT Start Time 0947    PT Stop Time 1029    PT Time Calculation (min) 42 min    Activity Tolerance Patient tolerated treatment well    Behavior During Therapy [facility] for tasks assessed/performed             Past Medical History:  Diagnosis Date   Anxiety    Arthritis    Asthma    HTN (hypertension)    "i had a gastric sleeve done and lost 100 pounds and i dont have htn anymore, my doctor, Dr Hilma Favors took me off of it "    Hypothyroid    Memory loss    Seasonal allergies    Sinus bradycardia    Urinary tract infection     Past Surgical History:  Procedure Laterality Date   BLEPHAROPLASTY Bilateral    CATARACT EXTRACTION, BILATERAL     with lens    CESAREAN SECTION     FINGER SURGERY     left pinky   HERNIA REPAIR  2004   inguinal , incisional, navel    KNEE SURGERY Left    arthroscopic    LAPAROSCOPIC GASTRIC SLEEVE RESECTION     MASS EXCISION Left 10/20/2015   Procedure: EXCISION MASS LEFT SMALL FINGER;  Surgeon: Leanora Cover, MD;  Location: [place];  Service: Orthopedics;  Laterality: Left;   TONSILLECTOMY AND ADENOIDECTOMY     TOTAL KNEE ARTHROPLASTY Right 11/24/2018   Procedure: Right Knee Arthroplasty;  Surgeon: Frederik Pear, MD;  [facility];  Service: Orthopedics;  Laterality: Right;   TOTAL KNEE ARTHROPLASTY Left 05/27/2019   Procedure: LEFT TOTAL KNEE ARTHROPLASTY SAMD DAY DISCHARGE;  Surgeon: Frederik Pear, MD;  [facility];  Service: Orthopedics;  Laterality: Left;    There were no vitals filed  for this visit.   Subjective Assessment - 11/24/20 0936     Subjective COVID-19 screen performed prior to patient entering clinic. Had more pain upon waking but took tylenol so its better. Reports pain with cervical extension.    Pertinent History OA, HTN, hypothyroidism, hernia repair and knee surgery.    Patient Stated Goals Reduce oain when moving neck and increase range of motion.    Currently in Pain? Yes    Pain Score 2     Pain Location Neck    Pain Orientation Right    Pain Descriptors / Indicators Discomfort    Pain Type Chronic pain    Pain Onset More than a month ago    Pain Frequency Constant                [facility] PT Assessment - 11/24/20 0001       Assessment   Medical Diagnosis Cervical spine pain.    Referring Provider (PT) Frederik Pear MD      Precautions   Precautions None                           [facility] Adult PT Treatment/Exercise -  11/24/20 0001       Modalities   Modalities Electrical Stimulation;Ultrasound      Acupuncturist Location R UT    Electrical Stimulation Action IFC    Electrical Stimulation Parameters 80-150 hz x15 min    Electrical Stimulation Goals Pain;Tone      Ultrasound   Ultrasound Location R UT, cervical paraspinals    Ultrasound Parameters Combo 1.5 w/cm2, 100%, 1 mhz x10 min    Ultrasound Goals Pain      Manual Therapy   Manual Therapy Soft tissue mobilization    Soft tissue mobilization STW to R cervical paraspinals, UT, levator scapula to reduce tone                         PT Long Term Goals - 11/01/20 1216       PT LONG TERM GOAL #1   Time 6    Period Weeks    Status New      PT LONG TERM GOAL #2   Title Increase active cervical rotation to 55 degrees+ so patient can turn head more easily while driving.    Time 6    Period Weeks    Status New      PT LONG TERM GOAL #3   Title Perform ADL's with pain not > 2-3/10.    Time 6    Period  Weeks    Status New                   Plan - 11/24/20 1016     Clinical Impression Statement Patient presented in clinic with reports of some cervical pain upon waking but also reported pain with cervical extension. More focus during manual therapy was to R cervical paraspinals and scalenes. Normal modalities response noted following removal of the modality.    Personal Factors and Comorbidities Comorbidity 1;Other;Comorbidity 2    Comorbidities OA, HTN, hypothyroidism, hernia repair and knee surgery.    Examination-Activity Limitations Other    Examination-Participation Restrictions Other    Stability/Clinical Decision Making Stable/Uncomplicated    Rehab Potential Good    PT Frequency 2x / week    PT Duration 6 weeks    PT Treatment/Interventions ADLs/Self Care Home Management;Cryotherapy;Electrical Stimulation;Ultrasound;Moist Heat;Therapeutic activities;Therapeutic exercise;Traction;Manual techniques;Patient/family education;Passive range of motion;Dry needling    PT Next Visit Plan Chin tucks and cervical extension, combo e'stim/US and STW/M, postural exercises.    Consulted and Agree with Plan of Care Patient             Patient will benefit from skilled therapeutic intervention in order to improve the following deficits and impairments:  Pain, Decreased activity tolerance, Decreased range of motion, Decreased strength, Increased muscle spasms, Postural dysfunction  Visit Diagnosis: Cervicalgia  Abnormal posture     Problem List Patient Active Problem List   Diagnosis Date Noted   Gait abnormality 07/06/2019   Degenerative arthritis of left knee 05/26/2019   Total knee replacement status, right 11/24/2018   Osteoarthritis of right knee 11/20/2018   Mild cognitive impairment 10/09/2016   High blood pressure 09/15/2013   Hypothyroidism 09/15/2013   Obesity (BMI 30-39.9) 09/15/2013   S/P arthroscopy of left knee 10/02/2011   Lateral meniscal tear 01/18/2011     Standley Brooking, PTA 11/24/2020, 11:14 AM  [facility] [place], [place], [address] Phone: [phone]   Fax:  [phone]  Name: Lisa Parker MRN: [id number] Date  of Birth: 1945/12/01

## 2020-11-29 ENCOUNTER — Other Ambulatory Visit: Payer: Self-pay

## 2020-11-29 ENCOUNTER — Ambulatory Visit: Payer: Medicare Other | Admitting: Physical Therapy

## 2020-11-29 ENCOUNTER — Encounter: Payer: Self-pay | Admitting: Physical Therapy

## 2020-11-29 DIAGNOSIS — M6281 Muscle weakness (generalized): Secondary | ICD-10-CM | POA: Diagnosis not present

## 2020-11-29 DIAGNOSIS — M542 Cervicalgia: Secondary | ICD-10-CM

## 2020-11-29 DIAGNOSIS — R293 Abnormal posture: Secondary | ICD-10-CM

## 2020-11-29 NOTE — Therapy (Signed)
[facility] [place], [place], [address] Phone: [phone]   Fax:  [phone]  Physical Therapy Treatment  Patient Details  Name: Lisa Parker MRN: [id number] Date of Birth: 11-Apr-1946 Referring Provider (PT): Frederik Pear MD   Encounter Date: 11/29/2020   PT End of Session - 11/29/20 0946     Visit Number 7    Number of Visits 12    Date for PT Re-Evaluation 12/13/20    PT Start Time 0946    PT Stop Time 1027    PT Time Calculation (min) 41 min    Activity Tolerance Patient tolerated treatment well    Behavior During Therapy [facility] for tasks assessed/performed             Past Medical History:  Diagnosis Date   Anxiety    Arthritis    Asthma    HTN (hypertension)    "i had a gastric sleeve done and lost 100 pounds and i dont have htn anymore, my doctor, Dr Hilma Favors took me off of it "    Hypothyroid    Memory loss    Seasonal allergies    Sinus bradycardia    Urinary tract infection     Past Surgical History:  Procedure Laterality Date   BLEPHAROPLASTY Bilateral    CATARACT EXTRACTION, BILATERAL     with lens    CESAREAN SECTION     FINGER SURGERY     left pinky   HERNIA REPAIR  2004   inguinal , incisional, navel    KNEE SURGERY Left    arthroscopic    LAPAROSCOPIC GASTRIC SLEEVE RESECTION     MASS EXCISION Left 10/20/2015   Procedure: EXCISION MASS LEFT SMALL FINGER;  Surgeon: Leanora Cover, MD;  Location: [place];  Service: Orthopedics;  Laterality: Left;   TONSILLECTOMY AND ADENOIDECTOMY     TOTAL KNEE ARTHROPLASTY Right 11/24/2018   Procedure: Right Knee Arthroplasty;  Surgeon: Frederik Pear, MD;  [facility];  Service: Orthopedics;  Laterality: Right;   TOTAL KNEE ARTHROPLASTY Left 05/27/2019   Procedure: LEFT TOTAL KNEE ARTHROPLASTY SAMD DAY DISCHARGE;  Surgeon: Frederik Pear, MD;  [facility];  Service: Orthopedics;  Laterality: Left;    There were no vitals filed  for this visit.   Subjective Assessment - 11/29/20 0946     Subjective COVID-19 screen performed prior to patient entering clinic. Less neck pain today and no headache when waking this morning.    Pertinent History OA, HTN, hypothyroidism, hernia repair and knee surgery.    Patient Stated Goals Reduce oain when moving neck and increase range of motion.    Currently in Pain? Yes    Pain Score 3     Pain Location Neck    Pain Orientation Right    Pain Descriptors / Indicators Discomfort    Pain Type Chronic pain    Pain Onset More than a month ago    Pain Frequency Constant                [facility] PT Assessment - 11/29/20 0001       Assessment   Medical Diagnosis Cervical spine pain.    Referring Provider (PT) Frederik Pear MD      Precautions   Precautions None                           [facility] Adult PT Treatment/Exercise - 11/29/20 0001  Modalities   Modalities Electrical Stimulation;Ultrasound      Engineer, mining Action Pre-Mod    Electrical Stimulation Parameters 80-150 hz x15 min    Electrical Stimulation Goals Pain;Tone      Ultrasound   Ultrasound Location R UT, cervical paraspinals    Ultrasound Parameters Combo 1.5 w/cm2, 100%, 1 mhz x10 min    Ultrasound Goals Pain      Manual Therapy   Manual Therapy Soft tissue mobilization    Soft tissue mobilization STW to R cervical paraspinals, UT, levator scapula to reduce tone                         PT Long Term Goals - 11/01/20 1216       PT LONG TERM GOAL #1   Time 6    Period Weeks    Status New      PT LONG TERM GOAL #2   Title Increase active cervical rotation to 55 degrees+ so patient can turn head more easily while driving.    Time 6    Period Weeks    Status New      PT LONG TERM GOAL #3   Title Perform ADL's with pain not > 2-3/10.    Time 6    Period Weeks    Status New                    Plan - 11/29/20 1031     Clinical Impression Statement Patient presented in clinic with minimal cervical pain and no headache reported upon waking. Patient was busy pulling baseboards yesterday but did not cause any increased pain today. No reports of tenderneess reported during manual therapy. Normal modalities response noted following removal of the modalities.    Personal Factors and Comorbidities Comorbidity 1;Other;Comorbidity 2    Comorbidities OA, HTN, hypothyroidism, hernia repair and knee surgery.    Examination-Activity Limitations Other    Examination-Participation Restrictions Other    Stability/Clinical Decision Making Stable/Uncomplicated    Rehab Potential Good    PT Frequency 2x / week    PT Duration 6 weeks    PT Treatment/Interventions ADLs/Self Care Home Management;Cryotherapy;Electrical Stimulation;Ultrasound;Moist Heat;Therapeutic activities;Therapeutic exercise;Traction;Manual techniques;Patient/family education;Passive range of motion;Dry needling    PT Next Visit Plan Chin tucks and cervical extension, combo e'stim/US and STW/M, postural exercises.    Consulted and Agree with Plan of Care Patient             Patient will benefit from skilled therapeutic intervention in order to improve the following deficits and impairments:  Pain, Decreased activity tolerance, Decreased range of motion, Decreased strength, Increased muscle spasms, Postural dysfunction  Visit Diagnosis: Cervicalgia  Abnormal posture     Problem List Patient Active Problem List   Diagnosis Date Noted   Gait abnormality 07/06/2019   Degenerative arthritis of left knee 05/26/2019   Total knee replacement status, right 11/24/2018   Osteoarthritis of right knee 11/20/2018   Mild cognitive impairment 10/09/2016   High blood pressure 09/15/2013   Hypothyroidism 09/15/2013   Obesity (BMI 30-39.9) 09/15/2013   S/P arthroscopy of left knee 10/02/2011   Lateral meniscal  tear 01/18/2011    Standley Brooking, PTA 11/29/2020, 10:43 AM  [facility] [place], [place], [address] Phone: [phone]   Fax:  [phone]  Name: Lisa Parker MRN: [id number] Date of Birth: 1946-05-07

## 2020-12-01 ENCOUNTER — Ambulatory Visit: Payer: Medicare Other | Admitting: Physical Therapy

## 2020-12-01 ENCOUNTER — Encounter: Payer: Self-pay | Admitting: Physical Therapy

## 2020-12-01 ENCOUNTER — Other Ambulatory Visit: Payer: Self-pay

## 2020-12-01 DIAGNOSIS — R293 Abnormal posture: Secondary | ICD-10-CM | POA: Diagnosis not present

## 2020-12-01 DIAGNOSIS — M542 Cervicalgia: Secondary | ICD-10-CM

## 2020-12-01 DIAGNOSIS — M6281 Muscle weakness (generalized): Secondary | ICD-10-CM | POA: Diagnosis not present

## 2020-12-01 NOTE — Therapy (Signed)
[facility] [place], [place], [address] Phone: [phone]   Fax:  [phone]  Physical Therapy Treatment  Patient Details  Name: Lisa Parker MRN: [id number] Date of Birth: 1945-10-04 Referring Provider (PT): Frederik Pear MD   Encounter Date: 12/01/2020   PT End of Session - 12/01/20 0945     Visit Number 8    Number of Visits 12    Date for PT Re-Evaluation 12/13/20    PT Start Time 0947    PT Stop Time 1028    PT Time Calculation (min) 41 min    Activity Tolerance Patient tolerated treatment well    Behavior During Therapy [facility] for tasks assessed/performed             Past Medical History:  Diagnosis Date   Anxiety    Arthritis    Asthma    HTN (hypertension)    "i had a gastric sleeve done and lost 100 pounds and i dont have htn anymore, my doctor, Dr Hilma Favors took me off of it "    Hypothyroid    Memory loss    Seasonal allergies    Sinus bradycardia    Urinary tract infection     Past Surgical History:  Procedure Laterality Date   BLEPHAROPLASTY Bilateral    CATARACT EXTRACTION, BILATERAL     with lens    CESAREAN SECTION     FINGER SURGERY     left pinky   HERNIA REPAIR  2004   inguinal , incisional, navel    KNEE SURGERY Left    arthroscopic    LAPAROSCOPIC GASTRIC SLEEVE RESECTION     MASS EXCISION Left 10/20/2015   Procedure: EXCISION MASS LEFT SMALL FINGER;  Surgeon: Leanora Cover, MD;  Location: [place];  Service: Orthopedics;  Laterality: Left;   TONSILLECTOMY AND ADENOIDECTOMY     TOTAL KNEE ARTHROPLASTY Right 11/24/2018   Procedure: Right Knee Arthroplasty;  Surgeon: Frederik Pear, MD;  [facility];  Service: Orthopedics;  Laterality: Right;   TOTAL KNEE ARTHROPLASTY Left 05/27/2019   Procedure: LEFT TOTAL KNEE ARTHROPLASTY SAMD DAY DISCHARGE;  Surgeon: Frederik Pear, MD;  [facility];  Service: Orthopedics;  Laterality: Left;    There were no vitals filed  for this visit.   Subjective Assessment - 12/01/20 0945     Subjective COVID-19 screen performed prior to patient entering clinic. neck feels better today but still has some soreness posterior to R ear.    Pertinent History OA, HTN, hypothyroidism, hernia repair and knee surgery.    Patient Stated Goals Reduce oain when moving neck and increase range of motion.    Currently in Pain? Other (Comment)   No pain assessment provided               [facility] PT Assessment - 12/01/20 0001       Assessment   Medical Diagnosis Cervical spine pain.    Referring Provider (PT) Frederik Pear MD      Precautions   Precautions None                           [facility] Adult PT Treatment/Exercise - 12/01/20 0001       Modalities   Modalities Electrical Stimulation;Ultrasound      Electrical Stimulation   Electrical Stimulation Location R UT, cervical paraspinals    Electrical Stimulation Action Pre-Mod    Electrical Stimulation Parameters 80-150 hz x15  min    Electrical Stimulation Goals Pain      Ultrasound   Ultrasound Location R UT, cervical paraspinals    Ultrasound Parameters Combo 1.5 w/cm2, 100%, 1 mhz x10 min    Ultrasound Goals Pain      Manual Therapy   Manual Therapy Soft tissue mobilization    Soft tissue mobilization STW to R cervical paraspinals, UT, levator scapula to reduce tone                         PT Long Term Goals - 11/01/20 1216       PT LONG TERM GOAL #1   Time 6    Period Weeks    Status New      PT LONG TERM GOAL #2   Title Increase active cervical rotation to 55 degrees+ so patient can turn head more easily while driving.    Time 6    Period Weeks    Status New      PT LONG TERM GOAL #3   Title Perform ADL's with pain not > 2-3/10.    Time 6    Period Weeks    Status New                   Plan - 12/01/20 1044     Clinical Impression Statement Patient presented in clinic with reports of overall  improvement but continued soreness just behind the R ear. Patient presented with minimal tone of R UT, cervical paraspinals region. Patient denies any dizziness symptoms. Normal modalities response noted following removal of the modalities.    Personal Factors and Comorbidities Comorbidity 1;Other;Comorbidity 2    Comorbidities OA, HTN, hypothyroidism, hernia repair and knee surgery.    Examination-Activity Limitations Other    Examination-Participation Restrictions Other    Stability/Clinical Decision Making Stable/Uncomplicated    Rehab Potential Good    PT Frequency 2x / week    PT Duration 6 weeks    PT Treatment/Interventions ADLs/Self Care Home Management;Cryotherapy;Electrical Stimulation;Ultrasound;Moist Heat;Therapeutic activities;Therapeutic exercise;Traction;Manual techniques;Patient/family education;Passive range of motion;Dry needling    PT Next Visit Plan Chin tucks and cervical extension, combo e'stim/US and STW/M, postural exercises.    Consulted and Agree with Plan of Care Patient             Patient will benefit from skilled therapeutic intervention in order to improve the following deficits and impairments:  Pain, Decreased activity tolerance, Decreased range of motion, Decreased strength, Increased muscle spasms, Postural dysfunction  Visit Diagnosis: Cervicalgia  Abnormal posture     Problem List Patient Active Problem List   Diagnosis Date Noted   Gait abnormality 07/06/2019   Degenerative arthritis of left knee 05/26/2019   Total knee replacement status, right 11/24/2018   Osteoarthritis of right knee 11/20/2018   Mild cognitive impairment 10/09/2016   High blood pressure 09/15/2013   Hypothyroidism 09/15/2013   Obesity (BMI 30-39.9) 09/15/2013   S/P arthroscopy of left knee 10/02/2011   Lateral meniscal tear 01/18/2011    Standley Brooking, PTA 12/01/2020, 10:48 AM  [facility] [place], [place], [address] Phone: [phone]   Fax:  [phone]  Name: Lisa Parker MRN: [id number] Date of Birth: 1946/03/04

## 2020-12-06 ENCOUNTER — Ambulatory Visit: Payer: Medicare Other | Attending: Orthopedic Surgery | Admitting: Physical Therapy

## 2020-12-06 ENCOUNTER — Other Ambulatory Visit: Payer: Self-pay

## 2020-12-06 ENCOUNTER — Encounter: Payer: Self-pay | Admitting: Physical Therapy

## 2020-12-06 DIAGNOSIS — R293 Abnormal posture: Secondary | ICD-10-CM | POA: Insufficient documentation

## 2020-12-06 DIAGNOSIS — M542 Cervicalgia: Secondary | ICD-10-CM | POA: Diagnosis not present

## 2020-12-06 NOTE — Therapy (Signed)
[facility] [place], [place], [address] Phone: [phone]   Fax:  [phone]  Physical Therapy Treatment  Patient Details  Name: Lisa Parker MRN: [id number] Date of Birth: [birth year] Referring Provider (PT): Frederik Pear MD   Encounter Date: 12/06/2020   PT End of Session - 12/06/20 0950     Visit Number 9    Number of Visits 12    Date for PT Re-Evaluation 12/13/20    PT Start Time 0950    PT Stop Time 1030    PT Time Calculation (min) 40 min    Activity Tolerance Patient tolerated treatment well    Behavior During Therapy [facility] for tasks assessed/performed             Past Medical History:  Diagnosis Date   Anxiety    Arthritis    Asthma    HTN (hypertension)    "i had a gastric sleeve done and lost 100 pounds and i dont have htn anymore, my doctor, Dr Hilma Favors took me off of it "    Hypothyroid    Memory loss    Seasonal allergies    Sinus bradycardia    Urinary tract infection     Past Surgical History:  Procedure Laterality Date   BLEPHAROPLASTY Bilateral    CATARACT EXTRACTION, BILATERAL     with lens    CESAREAN SECTION     FINGER SURGERY     left pinky   HERNIA REPAIR  2004   inguinal , incisional, navel    KNEE SURGERY Left    arthroscopic    LAPAROSCOPIC GASTRIC SLEEVE RESECTION     MASS EXCISION Left 10/20/2015   Procedure: EXCISION MASS LEFT SMALL FINGER;  Surgeon: Leanora Cover, MD;  Location: [place];  Service: Orthopedics;  Laterality: Left;   TONSILLECTOMY AND ADENOIDECTOMY     TOTAL KNEE ARTHROPLASTY Right 11/24/2018   Procedure: Right Knee Arthroplasty;  Surgeon: Frederik Pear, MD;  [facility]: [facility];  Service: Orthopedics;  Laterality: Right;   TOTAL KNEE ARTHROPLASTY Left 05/27/2019   Procedure: LEFT TOTAL KNEE ARTHROPLASTY SAMD DAY DISCHARGE;  Surgeon: Frederik Pear, MD;  Location: [facility];  Service: Orthopedics;  Laterality: Left;    There were no vitals filed for  this visit.   Subjective Assessment - 12/06/20 0947     Subjective COVID-19 screen performed prior to patient entering clinic. Signed DN concent form. Had a busy day yesterday with many tasks.    Pertinent History OA, HTN, hypothyroidism, hernia repair and knee surgery.    Patient Stated Goals Reduce oain when moving neck and increase range of motion.    Currently in Pain? Other (Comment)   No pain assessment provided               [facility] PT Assessment - 12/06/20 0001       Assessment   Medical Diagnosis Cervical spine pain.    Referring Provider (PT) Frederik Pear MD      Precautions   Precautions None                           [facility] Adult PT Treatment/Exercise - 12/06/20 0001       Modalities   Modalities Electrical Stimulation;Ultrasound      Electrical Stimulation   Electrical Stimulation Location R UT, cervical paraspinals    Electrical Stimulation Action IFC    Electrical Stimulation Parameters 80-150 hz x15 min  Electrical Stimulation Goals Pain      Ultrasound   Ultrasound Location R UT, cervical paraspinals    Ultrasound Parameters Combo 1.5 w/cm2, 100%, 1 mhz x10 min    Ultrasound Goals Pain      Manual Therapy   Manual Therapy Soft tissue mobilization    Soft tissue mobilization STW to R cervical paraspinals, UT, levator scapula to reduce tone                         PT Long Term Goals - 11/01/20 1216       PT LONG TERM GOAL #1   Time 6    Period Weeks    Status New      PT LONG TERM GOAL #2   Title Increase active cervical rotation to 55 degrees+ so patient can turn head more easily while driving.    Time 6    Period Weeks    Status New      PT LONG TERM GOAL #3   Title Perform ADL's with pain not > 2-3/10.    Time 6    Period Weeks    Status New                   Plan - 12/06/20 1055     Clinical Impression Statement Patient presented in clinic with no complaints of pain although very busy  with activities yesteday. Continued focus of treatment to cervical paraspinals and superior UT fibers. No reports of tenderness or pain during manual therapy. Normal modalities response noted following removal of the modalities.    Personal Factors and Comorbidities Comorbidity 1;Other;Comorbidity 2    Comorbidities OA, HTN, hypothyroidism, hernia repair and knee surgery.    Examination-Activity Limitations Other    Examination-Participation Restrictions Other    Stability/Clinical Decision Making Stable/Uncomplicated    Rehab Potential Good    PT Frequency 2x / week    PT Duration 6 weeks    PT Treatment/Interventions ADLs/Self Care Home Management;Cryotherapy;Electrical Stimulation;Ultrasound;Moist Heat;Therapeutic activities;Therapeutic exercise;Traction;Manual techniques;Patient/family education;Passive range of motion;Dry needling    PT Next Visit Plan Chin tucks and cervical extension, combo e'stim/US and STW/M, postural exercises.    Consulted and Agree with Plan of Care Patient             Patient will benefit from skilled therapeutic intervention in order to improve the following deficits and impairments:  Pain, Decreased activity tolerance, Decreased range of motion, Decreased strength, Increased muscle spasms, Postural dysfunction  Visit Diagnosis: Cervicalgia  Abnormal posture     Problem List Patient Active Problem List   Diagnosis Date Noted   Gait abnormality 07/06/2019   Degenerative arthritis of left knee 05/26/2019   Total knee replacement status, right 11/24/2018   Osteoarthritis of right knee 11/20/2018   Mild cognitive impairment 10/09/2016   High blood pressure 09/15/2013   Hypothyroidism 09/15/2013   Obesity (BMI 30-39.9) 09/15/2013   S/P arthroscopy of left knee 10/02/2011   Lateral meniscal tear 01/18/2011    Standley Brooking, PTA 12/06/2020, 11:12 AM  [facility] [place], [place],  [address] Phone: [phone]   Fax:  [phone]  Name: Lisa Parker MRN: [id number] Date of Birth: 1945/07/28

## 2020-12-08 ENCOUNTER — Other Ambulatory Visit: Payer: Self-pay

## 2020-12-08 ENCOUNTER — Ambulatory Visit: Payer: Medicare Other | Admitting: Physical Therapy

## 2020-12-08 DIAGNOSIS — M542 Cervicalgia: Secondary | ICD-10-CM | POA: Diagnosis not present

## 2020-12-08 DIAGNOSIS — R293 Abnormal posture: Secondary | ICD-10-CM

## 2020-12-08 NOTE — Patient Instructions (Addendum)
Strengthening: Lateral Bend - Isometric (in Neutral)    Using light pressure from fingertips, press into right then left temple. Resist bending head sideways. Hold __10__ seconds. Repeat _5___ times per set. Do _2___ sets per session. Do _2___ sessions per day.   Strengthening: Flexion - Isometric (in Neutral)    Using light pressure from fingertips at forehead, resist bending head forward. Hold __10__ seconds. Repeat __5__ times per set. Do __2__ sets per session. Do _2___ sessions per day.   Strengthening: Extension - Isometric (in Neutral)    Using light pressure from fingertips at back of head, resist bending head backward. Hold _10___ seconds. Repeat __5__ times per set. Do _2___ sets per session. Do __2__ sessions per day.   AROM: Neck Rotation   Turn head slowly to look over one shoulder, then the other. Hold each position _10___ seconds. Repeat _5___ times per set. Do __2__ sets per session. Do _2-3___ sessions per day.  http://orth.exer.us/294   Copyright  VHI. All rights reserved.  AROM: Lateral Neck Flexion   Slowly tilt head toward one shoulder, then the other. Hold each position _10___ seconds. Repeat __5__ times per set. Do __2__ sets per session. Do __2-3__ sessions per day.  http://orth.exer.us/296   Copyright  VHI. All rights reserved.  Stretch Break - Chin Tuck   Looking straight forward, tuck chin and hold __10__ seconds. Relax and return to starting position. Repeat __5-10__ times every _3-4___ hours.  Copyright  VHI. All rights reserved.  Stretch Break - Chest and Shoulder Stretch   Maintaining erect posture, draw shoulders back while bringing elbows back and inward. Return to starting position. Repeat __10-20__ times every _3-4___ hours.  Copyright  VHI. All rights reserved.       

## 2020-12-08 NOTE — Therapy (Signed)
[facility] [place], [place], [address] Phone: [phone]   Fax:  [phone]  Physical Therapy Treatment  Patient Details  Name: Lisa Parker MRN: [id number] Date of Birth: [birth year] Referring Provider (PT): Frederik Pear MD   Encounter Date: 12/08/2020   PT End of Session - 12/08/20 1002     Visit Number 10    Number of Visits 12    Date for PT Re-Evaluation 12/13/20    PT Start Time 0945    PT Stop Time 1027    PT Time Calculation (min) 42 min    Activity Tolerance Patient tolerated treatment well    Behavior During Therapy [facility] for tasks assessed/performed             Past Medical History:  Diagnosis Date   Anxiety    Arthritis    Asthma    HTN (hypertension)    "i had a gastric sleeve done and lost 100 pounds and i dont have htn anymore, my doctor, Dr Hilma Favors took me off of it "    Hypothyroid    Memory loss    Seasonal allergies    Sinus bradycardia    Urinary tract infection     Past Surgical History:  Procedure Laterality Date   BLEPHAROPLASTY Bilateral    CATARACT EXTRACTION, BILATERAL     with lens    CESAREAN SECTION     FINGER SURGERY     left pinky   HERNIA REPAIR  2004   inguinal , incisional, navel    KNEE SURGERY Left    arthroscopic    LAPAROSCOPIC GASTRIC SLEEVE RESECTION     MASS EXCISION Left 10/20/2015   Procedure: EXCISION MASS LEFT SMALL FINGER;  Surgeon: Leanora Cover, MD;  Location: [place];  Service: Orthopedics;  Laterality: Left;   TONSILLECTOMY AND ADENOIDECTOMY     TOTAL KNEE ARTHROPLASTY Right 11/24/2018   Procedure: Right Knee Arthroplasty;  Surgeon: Frederik Pear, MD;  [facility];  Service: Orthopedics;  Laterality: Right;   TOTAL KNEE ARTHROPLASTY Left 05/27/2019   Procedure: LEFT TOTAL KNEE ARTHROPLASTY SAMD DAY DISCHARGE;  Surgeon: Frederik Pear, MD;  [facility];  Service: Orthopedics;  Laterality: Left;    There were no vitals filed  for this visit.   Subjective Assessment - 12/08/20 0951     Subjective COVID-19 screen performed prior to patient entering clinic. Patient reported good progress thus far some ongoing discomfort.    Pertinent History OA, HTN, hypothyroidism, hernia repair and knee surgery.    Patient Stated Goals Reduce oain when moving neck and increase range of motion.    Currently in Pain? Yes    Pain Score 3     Pain Location Neck    Pain Orientation Right    Pain Descriptors / Indicators Discomfort    Pain Type Chronic pain    Pain Onset More than a month ago    Pain Frequency Constant    Aggravating Factors  vaccum and some ADL's/laying on right side    Pain Relieving Factors rest and slow upright movements                [facility] PT Assessment - 12/08/20 0001       AROM   AROM Assessment Site Cervical    Cervical - Right Rotation 30    Cervical - Left Rotation 35  [facility] Adult PT Treatment/Exercise - 12/08/20 0001       Electrical Stimulation   Electrical Stimulation Location R UT    Electrical Stimulation Action IFC    Electrical Stimulation Parameters 80-150hz x31mn    Electrical Stimulation Goals Pain      Ultrasound   Ultrasound Location R UT cervical paraspinals    Ultrasound Parameters combo 1.5 W/cm2/100%/147m x1025m   Ultrasound Goals Pain      Manual Therapy   Manual Therapy Soft tissue mobilization    Soft tissue mobilization STW to R cervical paraspinals, UT, levator scapula to reduce tone                    PT Education - 12/08/20 1004     Education Details HEP    Person(s) Educated Patient    Methods Explanation;Demonstration;Handout    Comprehension Verbalized understanding;Returned demonstration                 PT Long Term Goals - 12/08/20 0952       PT LONG TERM GOAL #1   Title Patient will be independent with HEP    Baseline issued 12/08/20    Time 6    Period Weeks    Status Achieved       PT LONG TERM GOAL #2   Title Increase active cervical rotation to 55 degrees+ so patient can turn head more easily while driving.    Baseline 30Rt/35LT 12/08/20    Time 6    Period Weeks    Status On-going      PT LONG TERM GOAL #3   Title Perform ADL's with pain not > 2-3/10.    Baseline Some ADL's up to 7/10 12/08/20    Time 6    Period Weeks    Status On-going                   Plan - 12/08/20 1021     Clinical Impression Statement Patient tolerated treatment well today. Patient has reported overall improvement with pain at 3/10. Patient is limited with certain movements and ADL's esp vaccuming due to increased pain 7/10. Patient cervical rotation is continued to be limited. HEP provided today for home progression. Patient met LTG #1 with others progressing.    Personal Factors and Comorbidities Comorbidity 1;Other;Comorbidity 2    Comorbidities OA, HTN, hypothyroidism, hernia repair and knee surgery.    Examination-Activity Limitations Other    Examination-Participation Restrictions Other    Stability/Clinical Decision Making Stable/Uncomplicated    Rehab Potential Good    PT Frequency 2x / week    PT Duration 6 weeks    PT Treatment/Interventions ADLs/Self Care Home Management;Cryotherapy;Electrical Stimulation;Ultrasound;Moist Heat;Therapeutic activities;Therapeutic exercise;Traction;Manual techniques;Patient/family education;Passive range of motion;Dry needling    PT Next Visit Plan cont with POC 2 visits and possible DC/patient going to ENT for further medical advice    Consulted and Agree with Plan of Care Patient             Patient will benefit from skilled therapeutic intervention in order to improve the following deficits and impairments:  Pain, Decreased activity tolerance, Decreased range of motion, Decreased strength, Increased muscle spasms, Postural dysfunction  Visit Diagnosis: Cervicalgia  Abnormal posture     Problem List Patient Active  Problem List   Diagnosis Date Noted   Gait abnormality 07/06/2019   Degenerative arthritis of left knee 05/26/2019   Total knee replacement status, right 11/24/2018   Osteoarthritis of right knee  11/20/2018   Mild cognitive impairment 10/09/2016   High blood pressure 09/15/2013   Hypothyroidism 09/15/2013   Obesity (BMI 30-39.9) 09/15/2013   S/P arthroscopy of left knee 10/02/2011   Lateral meniscal tear 01/18/2011    Ladean Raya, PTA 12/08/20 10:32 AM   [facility] [place], [place], [address] Phone: [phone]   Fax:  [phone]  Name: Lisa Parker MRN: [id number] Date of Birth: 1945-08-29   Progress Note Reporting Period 11/01/20 to 12/08/20  See note below for Objective Data and Assessment of Progress/Goals. LTG #1 met.  Other goals ongoing at this time.    Mali Applegate MPT

## 2020-12-13 ENCOUNTER — Encounter: Payer: Medicare Other | Admitting: Physical Therapy

## 2020-12-13 DIAGNOSIS — J310 Chronic rhinitis: Secondary | ICD-10-CM | POA: Diagnosis not present

## 2020-12-13 DIAGNOSIS — H903 Sensorineural hearing loss, bilateral: Secondary | ICD-10-CM | POA: Diagnosis not present

## 2020-12-13 DIAGNOSIS — H6981 Other specified disorders of Eustachian tube, right ear: Secondary | ICD-10-CM | POA: Diagnosis not present

## 2020-12-15 ENCOUNTER — Encounter: Payer: Medicare Other | Admitting: Physical Therapy

## 2021-01-03 DIAGNOSIS — E063 Autoimmune thyroiditis: Secondary | ICD-10-CM | POA: Diagnosis not present

## 2021-01-19 ENCOUNTER — Ambulatory Visit: Payer: Medicare Other | Admitting: Neurology

## 2021-03-14 DIAGNOSIS — I10 Essential (primary) hypertension: Secondary | ICD-10-CM | POA: Diagnosis not present

## 2021-03-14 DIAGNOSIS — E039 Hypothyroidism, unspecified: Secondary | ICD-10-CM | POA: Diagnosis not present

## 2021-03-14 DIAGNOSIS — E063 Autoimmune thyroiditis: Secondary | ICD-10-CM | POA: Diagnosis not present

## 2021-03-14 DIAGNOSIS — M1991 Primary osteoarthritis, unspecified site: Secondary | ICD-10-CM | POA: Diagnosis not present

## 2021-04-10 DIAGNOSIS — I10 Essential (primary) hypertension: Secondary | ICD-10-CM | POA: Diagnosis not present

## 2021-04-10 DIAGNOSIS — E039 Hypothyroidism, unspecified: Secondary | ICD-10-CM | POA: Diagnosis not present

## 2021-04-21 DIAGNOSIS — G5602 Carpal tunnel syndrome, left upper limb: Secondary | ICD-10-CM | POA: Diagnosis not present

## 2021-05-26 DIAGNOSIS — H35422 Microcystoid degeneration of retina, left eye: Secondary | ICD-10-CM | POA: Diagnosis not present

## 2021-05-26 DIAGNOSIS — H26491 Other secondary cataract, right eye: Secondary | ICD-10-CM | POA: Diagnosis not present

## 2021-05-26 DIAGNOSIS — H43813 Vitreous degeneration, bilateral: Secondary | ICD-10-CM | POA: Diagnosis not present

## 2021-05-26 DIAGNOSIS — H35371 Puckering of macula, right eye: Secondary | ICD-10-CM | POA: Diagnosis not present

## 2021-05-30 ENCOUNTER — Ambulatory Visit: Payer: Medicare HMO | Admitting: Neurology

## 2021-05-30 ENCOUNTER — Encounter: Payer: Self-pay | Admitting: Neurology

## 2021-05-30 VITALS — BP 148/74 | HR 75 | Ht 66.0 in | Wt 178.0 lb

## 2021-05-30 DIAGNOSIS — G3184 Mild cognitive impairment, so stated: Secondary | ICD-10-CM | POA: Diagnosis not present

## 2021-05-30 DIAGNOSIS — R269 Unspecified abnormalities of gait and mobility: Secondary | ICD-10-CM

## 2021-05-30 MED ORDER — MEMANTINE HCL 10 MG PO TABS: ORAL_TABLET | ORAL | 4 refills | Status: AC

## 2021-05-30 MED ORDER — DONEPEZIL HCL 10 MG PO TABS: 10.0000 mg | ORAL_TABLET | Freq: Every day | ORAL | 4 refills | Status: DC

## 2021-05-30 NOTE — Progress Notes (Signed)
PATIENT: Lisa Parker DOB: 02-04-1946  Chief Complaint  Patient presents with   Follow-up    Rm 14, alone States she is doing well on memory medications, no new concerns      ASSESSMENT AND PLAN  Lisa Parker is a  female    Mild cognitive impairment   Strong family history of Alzheimer's disease  MRI of the brain without contrast showed mild generalized atrophy  Previous laboratory evaluation showed no treatable etiology,  MoCA examination 24/30 on May 30, 2021  Continue Namenda 10 mg twice a day, Aricept 10 mg daily  Optimize thyroid supplement   DIAGNOSTIC DATA (LABS, IMAGING, TESTING) - I reviewed patient records, labs, notes, testing and imaging myself where available.   HISTORICAL  Lisa Parker is a 76 years old right-handed female, seen in refer by her primary care doctor  Sharilyn Sites for evaluation of memory loss, initial evaluation was on October 09 2016.   I reviewed and summarized the referring note,she has history of hypothyroidism, on supplement, anxiety, she was recently changed from citalopram 40 mg to current escitalopram 20 mg since early May 2018, for worsening anxiety symptoms.  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  Mother had gradual onset memory loss in her eighties, now at age 21, she suffered significant memory loss, cognitive malfunction, to the point of needing help feeding, no longer ambulatory. Her maternal aunt died of Alzheimer's disease at age 61, her younger brother at age 87 also suffered some memory loss.  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 Her husband was recently diagnosed with Parkinson's disease.  I reviewed laboratory evaluation from [facility] in October 2017, elevated ferritin 388, B12 was 600, normal CBC, hemoglobin of 14.2,  UPDATE December 13 2016: She is accompanied by her husband at today's clinical visit, her anxiety is under better control with Lexapro 20 mg daily, new mental status examination he is 30/30 today, animal naming is 13, normal clock drawing  We have personally reviewed MRI of the brain without contrast in June 2018, mild generalized atrophy  UPDATE May 28 2017: She is overall doing very well, is able to keep up with her busy daily routine, there is no significant memory loss, tolerating Namenda 10 mg twice a day, Aricept 10 mg daily  UPDATE May 29 2018: She is doing much better, 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, less stress, no significant memory loss  UPDATE July 06 2019: She is overall doing well, Mini-Mental Status Examination is 30 out of 30, she had knee placement, right side in July 2020, left side January 2021, recovering well,  Update July 07, 2020 She is overall doing well, taking good care of her husband as well, complains of worsening knee pain, gait abnormality due to multiple joints disease, memory is stable, today's Mini-Mental status is 29/30  Update May 30, 2021, Since last visit, she has lost her husband, she lives alone, remain active, enjoys travel, interact with her friends, and her daughter, continue have gait abnormality due to joints problem.  PHYSICAL EXAM  Vitals:   05/30/21 1331  Weight: 178 lb (80.7 kg)  Height: 5\' 6"  (1.676 m)   Not recorded     Body mass index is 28.73 kg/m.  PHYSICAL EXAMNIATION:  Gen: NAD, conversant, well nourised,well groomed            NEUROLOGICAL EXAM:  MENTAL STATUS: Montreal Cognitive Assessment  05/30/2021  Visuospatial/ Executive (0/5) 2  Naming (0/3) 3  Attention: Read  list of digits (0/2) 2  Attention: Read list of letters (0/1) 1  Attention: Serial 7 subtraction starting at 100 (0/3) 3  Language: Repeat phrase (0/2) 2  Language : Fluency (0/1) 1  Abstraction (0/2) 2  Delayed Recall (0/5) 2  Orientation (0/6) 6  Total 24     CRANIAL NERVES: CN II: Visual fields are full to confrontation. Pupils are round equal and briskly reactive to light. CN III, IV, VI: extraocular movement are normal. No ptosis. CN V: Facial sensation is intact to light touch CN VII: Face is symmetric with normal eye closure and smile. CN VIII: Hearing is normal to casual conversation CN IX, X: Phonation is normal. CN XI: Head turning and shoulder shrug are intact  MOTOR: Normal strength,  REFLEXES: Reflexes are 1 and symmetric at the biceps, triceps, knees, and ankles. Plantar responses are flexor.  SENSORY: Intact to light touch,   COORDINATION: Rapid alternating movements and fine finger movements are intact. There is no dysmetria on finger-to-nose and heel-knee-shin.    GAIT/STANCE: She needs push-up to get at from seated position, valgus knee, mildly unsteady, mild bilateral ankle deformity    REVIEW OF SYSTEMS: Full 14 system review of systems performed and notable only for as above  All rest review of the system were negative ALLERGIES: Allergies  Allergen Reactions   Mold Extract  [Trichophyton]     Other reaction(s): Wheezing (ALLERGY/intolerance)   Pollen Extract     Other reaction(s): Wheezing (ALLERGY/intolerance)   Cucumber Extract Swelling   Other     Melon and Walnuts - swelling    HOME MEDICATIONS: Current Outpatient Medications  Medication Sig Dispense Refill   ALPRAZolam (XANAX) 0.5 MG tablet Take 0.5 mg by mouth at bedtime.     ascorbic acid (VITAMIN C) 500 MG tablet Take 500 mg by mouth daily.     bisoprolol-hydrochlorothiazide (ZIAC) 5-6.25 MG tablet Take 1 tablet by mouth daily.     budesonide-formoterol (SYMBICORT) 160-4.5 MCG/ACT  inhaler Inhale 2 puffs into the lungs 2 (two) times daily as needed (shortness of breath).     Calcium Carb-Cholecalciferol (CALCIUM 600 + D PO) Take 1 tablet by mouth daily.     Cholecalciferol (VITAMIN D) 2000 UNITS tablet Take 2,000 Units by mouth daily.     Cholecalciferol (VITAMIN D) 50 MCG (2000 UT) tablet Take 2,000 Units by mouth daily.     diclofenac Sodium (VOLTAREN) 1 % GEL Apply 1 application topically 4 (four) times daily as needed (pain).     donepezil (ARICEPT) 10 MG tablet Take 1 tablet (10 mg total) by mouth at bedtime. 90 tablet 4   DULoxetine (CYMBALTA) 20 MG capsule Take 20 mg by mouth daily.     furosemide (LASIX) 20 MG tablet Take 20 mg by mouth daily.     levothyroxine (SYNTHROID) 150 MCG tablet Take 150 mcg by mouth daily before breakfast.     memantine (NAMENDA) 10 MG tablet TAKE  (1) TABLET TWICE A DAY. 180 tablet 4   Multiple Vitamin (MULTIVITAMIN WITH MINERALS) TABS tablet Take 1  tablet by mouth daily.     Omega-3 Fatty Acids (OMEGA 3 500) 500 MG CAPS Take 500 mg by mouth daily.     oxybutynin (DITROPAN XL) 15 MG 24 hr tablet Take 15 mg by mouth daily.     SUPER B COMPLEX/C PO Take 2 tablets by mouth daily.     No current facility-administered medications for this visit.    PAST MEDICAL HISTORY: Past Medical History:  Diagnosis Date   Anxiety    Arthritis    Asthma    HTN (hypertension)    "i had a gastric sleeve done and lost 100 pounds and i dont have htn anymore, my doctor, Dr Hilma Favors took me off of it "    Hypothyroid    Memory loss    Seasonal allergies    Sinus bradycardia    Urinary tract infection     PAST SURGICAL HISTORY: Past Surgical History:  Procedure Laterality Date   BLEPHAROPLASTY Bilateral    CATARACT EXTRACTION, BILATERAL     with lens    CESAREAN SECTION     FINGER SURGERY     left pinky   HERNIA REPAIR  2004   inguinal , incisional, navel    KNEE SURGERY Left    arthroscopic    LAPAROSCOPIC GASTRIC SLEEVE RESECTION      MASS EXCISION Left 10/20/2015   Procedure: EXCISION MASS LEFT SMALL FINGER;  Surgeon: Leanora Cover, MD;  Location: [facility];  Service: Orthopedics;  Laterality: Left;   TONSILLECTOMY AND ADENOIDECTOMY     TOTAL KNEE ARTHROPLASTY Right 11/24/2018   Procedure: Right Knee Arthroplasty;  Surgeon: Frederik Pear, MD;  [facility];  Service: Orthopedics;  Laterality: Right;   TOTAL KNEE ARTHROPLASTY Left 05/27/2019   Procedure: LEFT TOTAL KNEE ARTHROPLASTY SAMD DAY DISCHARGE;  Surgeon: Frederik Pear, MD;  [facility];  Service: Orthopedics;  Laterality: Left;    FAMILY HISTORY: Family History  Problem Relation Age of Onset   Alzheimer's disease Mother    Lung cancer Father    Heart disease Other    Asthma Other    Alzheimer's disease Maternal Aunt     SOCIAL HISTORY:  Social History   Socioeconomic History   Marital status: Married    Spouse name: Not on file   Number of children: 1   Years of education: some college   Highest education level: Not on file  Occupational History   Occupation: farm work - retired  Tobacco Use   Smoking status: Former    Packs/day: 0.00    Years: 0.00    Pack years: 0.00    Types: Cigarettes    Start date: 1990   Smokeless tobacco: Never  Scientific laboratory technician Use: Never used  Substance and Sexual Activity   Alcohol use: No   Drug use: No   Sexual activity: Not on file  Other Topics Concern   Not on file  Social History Narrative   Lives at home with husband.   Right-handed.   No caffeine use.   Social Determinants of Health   Financial Resource Strain: Not on file  Food Insecurity: Not on file  Transportation Needs: Not on file  Physical Activity: Not on file  Stress: Not on file  Social Connections: Not on file  Intimate Partner Violence: Not on file    Marcial Pacas, M.D. Ph.D.  [facility] [place], [place] [address] Phone: [phone] Fax:      [phone] Everything  so you do  not think with blood pressure

## 2021-08-23 DIAGNOSIS — R69 Illness, unspecified: Secondary | ICD-10-CM | POA: Diagnosis not present

## 2021-08-24 DIAGNOSIS — Z6829 Body mass index (BMI) 29.0-29.9, adult: Secondary | ICD-10-CM | POA: Diagnosis not present

## 2021-08-24 DIAGNOSIS — E785 Hyperlipidemia, unspecified: Secondary | ICD-10-CM | POA: Diagnosis not present

## 2021-08-24 DIAGNOSIS — E663 Overweight: Secondary | ICD-10-CM | POA: Diagnosis not present

## 2021-08-24 DIAGNOSIS — F419 Anxiety disorder, unspecified: Secondary | ICD-10-CM | POA: Diagnosis not present

## 2021-08-24 DIAGNOSIS — R7309 Other abnormal glucose: Secondary | ICD-10-CM | POA: Diagnosis not present

## 2021-08-24 DIAGNOSIS — Z9884 Bariatric surgery status: Secondary | ICD-10-CM | POA: Diagnosis not present

## 2021-08-24 DIAGNOSIS — F329 Major depressive disorder, single episode, unspecified: Secondary | ICD-10-CM | POA: Diagnosis not present

## 2021-08-24 DIAGNOSIS — E039 Hypothyroidism, unspecified: Secondary | ICD-10-CM | POA: Diagnosis not present

## 2021-09-26 DIAGNOSIS — M5031 Other cervical disc degeneration,  high cervical region: Secondary | ICD-10-CM | POA: Diagnosis not present

## 2021-09-26 DIAGNOSIS — M9901 Segmental and somatic dysfunction of cervical region: Secondary | ICD-10-CM | POA: Diagnosis not present

## 2021-10-12 DIAGNOSIS — M5031 Other cervical disc degeneration,  high cervical region: Secondary | ICD-10-CM | POA: Diagnosis not present

## 2021-10-12 DIAGNOSIS — M9901 Segmental and somatic dysfunction of cervical region: Secondary | ICD-10-CM | POA: Diagnosis not present

## 2021-10-26 DIAGNOSIS — M5031 Other cervical disc degeneration,  high cervical region: Secondary | ICD-10-CM | POA: Diagnosis not present

## 2021-10-26 DIAGNOSIS — M9901 Segmental and somatic dysfunction of cervical region: Secondary | ICD-10-CM | POA: Diagnosis not present

## 2021-11-09 DIAGNOSIS — M5031 Other cervical disc degeneration,  high cervical region: Secondary | ICD-10-CM | POA: Diagnosis not present

## 2021-11-09 DIAGNOSIS — M9901 Segmental and somatic dysfunction of cervical region: Secondary | ICD-10-CM | POA: Diagnosis not present

## 2021-11-21 DIAGNOSIS — M5031 Other cervical disc degeneration,  high cervical region: Secondary | ICD-10-CM | POA: Diagnosis not present

## 2021-11-21 DIAGNOSIS — M9901 Segmental and somatic dysfunction of cervical region: Secondary | ICD-10-CM | POA: Diagnosis not present

## 2021-12-06 DIAGNOSIS — M5031 Other cervical disc degeneration,  high cervical region: Secondary | ICD-10-CM | POA: Diagnosis not present

## 2021-12-06 DIAGNOSIS — M9901 Segmental and somatic dysfunction of cervical region: Secondary | ICD-10-CM | POA: Diagnosis not present

## 2021-12-18 DIAGNOSIS — M9901 Segmental and somatic dysfunction of cervical region: Secondary | ICD-10-CM | POA: Diagnosis not present

## 2021-12-18 DIAGNOSIS — M5031 Other cervical disc degeneration,  high cervical region: Secondary | ICD-10-CM | POA: Diagnosis not present

## 2021-12-27 DIAGNOSIS — M5031 Other cervical disc degeneration,  high cervical region: Secondary | ICD-10-CM | POA: Diagnosis not present

## 2021-12-27 DIAGNOSIS — M9901 Segmental and somatic dysfunction of cervical region: Secondary | ICD-10-CM | POA: Diagnosis not present

## 2022-01-15 DIAGNOSIS — M9901 Segmental and somatic dysfunction of cervical region: Secondary | ICD-10-CM | POA: Diagnosis not present

## 2022-01-15 DIAGNOSIS — M5031 Other cervical disc degeneration,  high cervical region: Secondary | ICD-10-CM | POA: Diagnosis not present

## 2022-02-07 DIAGNOSIS — M5031 Other cervical disc degeneration,  high cervical region: Secondary | ICD-10-CM | POA: Diagnosis not present

## 2022-02-07 DIAGNOSIS — M9901 Segmental and somatic dysfunction of cervical region: Secondary | ICD-10-CM | POA: Diagnosis not present

## 2022-02-26 ENCOUNTER — Other Ambulatory Visit (HOSPITAL_COMMUNITY): Payer: Self-pay | Admitting: Family Medicine

## 2022-02-26 DIAGNOSIS — Z1231 Encounter for screening mammogram for malignant neoplasm of breast: Secondary | ICD-10-CM

## 2022-02-27 DIAGNOSIS — I10 Essential (primary) hypertension: Secondary | ICD-10-CM | POA: Diagnosis not present

## 2022-02-27 DIAGNOSIS — Z0001 Encounter for general adult medical examination with abnormal findings: Secondary | ICD-10-CM | POA: Diagnosis not present

## 2022-02-27 DIAGNOSIS — F321 Major depressive disorder, single episode, moderate: Secondary | ICD-10-CM | POA: Diagnosis not present

## 2022-02-27 DIAGNOSIS — Z1331 Encounter for screening for depression: Secondary | ICD-10-CM | POA: Diagnosis not present

## 2022-02-27 DIAGNOSIS — E785 Hyperlipidemia, unspecified: Secondary | ICD-10-CM | POA: Diagnosis not present

## 2022-02-27 DIAGNOSIS — Z6829 Body mass index (BMI) 29.0-29.9, adult: Secondary | ICD-10-CM | POA: Diagnosis not present

## 2022-02-27 DIAGNOSIS — Z9884 Bariatric surgery status: Secondary | ICD-10-CM | POA: Diagnosis not present

## 2022-02-27 DIAGNOSIS — E039 Hypothyroidism, unspecified: Secondary | ICD-10-CM | POA: Diagnosis not present

## 2022-02-27 DIAGNOSIS — E663 Overweight: Secondary | ICD-10-CM | POA: Diagnosis not present

## 2022-03-08 ENCOUNTER — Ambulatory Visit (HOSPITAL_COMMUNITY)
Admission: RE | Admit: 2022-03-08 | Discharge: 2022-03-08 | Disposition: A | Discharge status: Home / Self Care | Payer: Medicare HMO | Source: Ambulatory Visit | Attending: Family Medicine | Admitting: Family Medicine

## 2022-03-08 ENCOUNTER — Encounter (HOSPITAL_COMMUNITY): Payer: Self-pay | Admitting: Radiology

## 2022-03-08 DIAGNOSIS — Z1231 Encounter for screening mammogram for malignant neoplasm of breast: Secondary | ICD-10-CM | POA: Insufficient documentation

## 2022-03-14 DIAGNOSIS — M5031 Other cervical disc degeneration,  high cervical region: Secondary | ICD-10-CM | POA: Diagnosis not present

## 2022-03-14 DIAGNOSIS — M9901 Segmental and somatic dysfunction of cervical region: Secondary | ICD-10-CM | POA: Diagnosis not present

## 2022-03-28 DIAGNOSIS — M9901 Segmental and somatic dysfunction of cervical region: Secondary | ICD-10-CM | POA: Diagnosis not present

## 2022-03-28 DIAGNOSIS — M5031 Other cervical disc degeneration,  high cervical region: Secondary | ICD-10-CM | POA: Diagnosis not present

## 2022-04-18 DIAGNOSIS — M9901 Segmental and somatic dysfunction of cervical region: Secondary | ICD-10-CM | POA: Diagnosis not present

## 2022-04-18 DIAGNOSIS — M5031 Other cervical disc degeneration,  high cervical region: Secondary | ICD-10-CM | POA: Diagnosis not present

## 2022-05-31 ENCOUNTER — Encounter: Payer: Self-pay | Admitting: Neurology

## 2022-05-31 ENCOUNTER — Ambulatory Visit: Payer: Medicare HMO | Admitting: Neurology

## 2022-05-31 VITALS — BP 122/62 | HR 54 | Ht 64.0 in | Wt 175.0 lb

## 2022-05-31 DIAGNOSIS — R269 Unspecified abnormalities of gait and mobility: Secondary | ICD-10-CM | POA: Diagnosis not present

## 2022-05-31 DIAGNOSIS — G3184 Mild cognitive impairment, so stated: Secondary | ICD-10-CM | POA: Diagnosis not present

## 2022-05-31 NOTE — Progress Notes (Signed)
PATIENT: Lisa Parker DOB: [birth year]  Chief Complaint  Patient presents with   Follow-up    Rm 14. Patient alone, and only concern is talking in Lisa sleep to the point of waking herself up.      ASSESSMENT AND PLAN  Lisa Parker is a  female    Mild cognitive impairment   Strong family history of Alzheimer's disease  MRI of the brain without contrast showed age-related changes,  Previous laboratory evaluation showed no treatable etiology,  MoCA examination 27/30 in January 2024  Continue Namenda 10 mg twice a day, Aricept 10 mg daily  Optimize thyroid supplement  Return To Clinic as needed. DIAGNOSTIC DATA (LABS, IMAGING, TESTING) - I reviewed patient records, labs, notes, testing and imaging myself where available.   HISTORICAL  Lisa Parker is a 77 years old right-handed female, seen in refer by Lisa primary care doctor  Sharilyn Sites for evaluation of memory loss, initial evaluation was on October 09 2016.   I reviewed and summarized the referring note,she has history of hypothyroidism, on supplement, anxiety, she was recently changed from citalopram 40 mg to current escitalopram 20 mg since early May 2018, for worsening anxiety symptoms.  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  Mother had gradual onset memory loss in Lisa eighties, now at age 76, she suffered significant memory loss, cognitive malfunction, to the point of needing help feeding, no longer ambulatory. Lisa maternal aunt died of Alzheimer's disease at age 51, Lisa younger brother at age 81 also suffered some memory loss.  She began to notice memory loss since 2017, she does bookkeeping for Lisa business, she noticed she tends to [employer], she also has occasionally word finding difficulties, she tends to be very frustrated about about Lisa symptoms, with Lisa mother  suffered significant Alzheimer's disease, she is very worried she might get similar disease. Lisa Parker was recently diagnosed with Parkinson's disease.  I reviewed laboratory evaluation from [facility] in October 2017, elevated ferritin 388, B12 was 600, normal CBC, hemoglobin of 14.2,  UPDATE December 13 2016: She is accompanied by Lisa Parker at today's clinical visit, Lisa anxiety is under better control with Lexapro 20 mg daily, new mental status examination he is 30/30 today, animal naming is 13, normal clock drawing  We have personally reviewed MRI of the brain without contrast in June 2018, mild generalized atrophy  UPDATE May 28 2017: She is overall doing very well, is able to keep up with Lisa busy daily routine, there is no significant memory loss, tolerating Namenda 10 mg twice a day, Aricept 10 mg daily  UPDATE May 29 2018: She is doing much better, has sold Lisa apple orchards, less stress, no significant memory loss  UPDATE July 06 2019: She is overall doing well, Mini-Mental Status Examination is 30 out of 30, she had knee placement, right side in July 2020, left side January 2021, recovering well,  Update July 07, 2020 She is overall doing well, taking good care of Lisa Parker as well, complains of worsening knee pain, gait abnormality due to multiple joints disease, memory is stable, today's Mini-Mental status is 29/30  Update May 30, 2021, Since last visit, she has lost Lisa Parker, she lives alone, remain active, enjoys travel, interact with Lisa friends, and Lisa Parker, continue have gait abnormality  due to joints problem.  UPDATE May 31 2021: She is alone, Parker lives in the same time, overall doing well, traveled to [place] in August 2023, [place] in Oct 2023, enjoying Lisa freedom, doing well MoCA examination stable 27/30, major limitation is Lisa knee pain, bilateral ankle pain, still ambulate without assistant  PHYSICAL EXAM   Vitals:   05/30/21 1331   Weight: 178 lb (80.7 kg)  Height: '5\' 6"'$  (1.676 m)   Body mass index is 28.73 kg/m.  PHYSICAL EXAMNIATION:  Gen: NAD, conversant, well nourised,well groomed            NEUROLOGICAL EXAM:  MENTAL STATUS:    05/31/2022    1:25 PM 05/30/2021    1:39 PM  Montreal Cognitive Assessment   Visuospatial/ Executive (0/5) 4 2  Naming (0/3) 3 3  Attention: Read list of digits (0/2) 2 2  Attention: Read list of letters (0/1) 1 1  Attention: Serial 7 subtraction starting at 100 (0/3) 2 3  Language: Repeat phrase (0/2) 1 2  Language : Fluency (0/1) 1 1  Abstraction (0/2) 2 2  Delayed Recall (0/5) 5 2  Orientation (0/6) 6 6  Total 27 24     CRANIAL NERVES: CN II: Visual fields are full to confrontation. Pupils are round equal and briskly reactive to light. CN III, IV, VI: extraocular movement are normal. No ptosis. CN V: Facial sensation is intact to light touch CN VII: Face is symmetric with normal eye closure and smile. CN VIII: Hearing is normal to casual conversation CN IX, X: Phonation is normal. CN XI: Head turning and shoulder shrug are intact  MOTOR: Normal strength,  REFLEXES: Reflexes are 1 and symmetric at the biceps, triceps, knees, and ankles. Plantar responses are flexor.  SENSORY: Intact to light touch,   COORDINATION: Rapid alternating movements and fine finger movements are intact. There is no dysmetria on finger-to-nose and heel-knee-shin.    GAIT/STANCE: She needs push-up to get at from seated position, valgus knee, mildly unsteady,  bilateral ankle deformity    REVIEW OF SYSTEMS: Full 14 system review of systems performed and notable only for as above  All rest review of the system were negative ALLERGIES: Allergies  Allergen Reactions   Mold Extract  [Trichophyton]     Other reaction(s): Wheezing (ALLERGY/intolerance)   Pollen Extract     Other reaction(s): Wheezing (ALLERGY/intolerance)   Cucumber Extract Swelling   Other     Melon and Walnuts -  swelling    HOME MEDICATIONS: Current Outpatient Medications  Medication Sig Dispense Refill   ALPRAZolam (XANAX) 0.5 MG tablet Take 0.5 mg by mouth at bedtime.     ascorbic acid (VITAMIN C) 500 MG tablet Take 500 mg by mouth daily.     bisoprolol-hydrochlorothiazide (ZIAC) 5-6.25 MG tablet Take 1 tablet by mouth daily.     budesonide-formoterol (SYMBICORT) 160-4.5 MCG/ACT inhaler Inhale 2 puffs into the lungs 2 (two) times daily as needed (shortness of breath).     Calcium Carb-Cholecalciferol (CALCIUM 600 + D PO) Take 1 tablet by mouth daily.     Cholecalciferol (VITAMIN D) 2000 UNITS tablet Take 2,000 Units by mouth daily.     Cholecalciferol (VITAMIN D) 50 MCG (2000 UT) tablet Take 2,000 Units by mouth daily.     diclofenac Sodium (VOLTAREN) 1 % GEL Apply 1 application topically 4 (four) times daily as needed (pain).     donepezil (ARICEPT) 10 MG tablet Take 1 tablet (10 mg total) by mouth at bedtime.  90 tablet 4   DULoxetine (CYMBALTA) 20 MG capsule Take 20 mg by mouth daily.     furosemide (LASIX) 20 MG tablet Take 20 mg by mouth daily.     levothyroxine (SYNTHROID) 150 MCG tablet Take 150 mcg by mouth daily before breakfast.     memantine (NAMENDA) 10 MG tablet TAKE  (1) TABLET TWICE A DAY. 180 tablet 4   Multiple Vitamin (MULTIVITAMIN WITH MINERALS) TABS tablet Take 1 tablet by mouth daily.     Omega-3 Fatty Acids (OMEGA 3 500) 500 MG CAPS Take 500 mg by mouth daily.     oxybutynin (DITROPAN XL) 15 MG 24 hr tablet Take 15 mg by mouth daily.     SUPER B COMPLEX/C PO Take 2 tablets by mouth daily.     No current facility-administered medications for this visit.    PAST MEDICAL HISTORY: Past Medical History:  Diagnosis Date   Anxiety    Arthritis    Asthma    HTN (hypertension)    "i had a gastric sleeve done and lost 100 pounds and i dont have htn anymore, my doctor, Dr Hilma Favors took me off of it "    Hypothyroid    Memory loss    Seasonal allergies    Sinus bradycardia     Urinary tract infection     PAST SURGICAL HISTORY: Past Surgical History:  Procedure Laterality Date   BLEPHAROPLASTY Bilateral    CATARACT EXTRACTION, BILATERAL     with lens    CESAREAN SECTION     FINGER SURGERY     left pinky   HERNIA REPAIR  2004   inguinal , incisional, navel    KNEE SURGERY Left    arthroscopic    LAPAROSCOPIC GASTRIC SLEEVE RESECTION     MASS EXCISION Left 10/20/2015   Procedure: EXCISION MASS LEFT SMALL FINGER;  Surgeon: Leanora Cover, MD;  Location: [facility];  Service: Orthopedics;  Laterality: Left;   TONSILLECTOMY AND ADENOIDECTOMY     TOTAL KNEE ARTHROPLASTY Right 11/24/2018   Procedure: Right Knee Arthroplasty;  Surgeon: Frederik Pear, MD;  Location: [facility];  Service: Orthopedics;  Laterality: Right;   TOTAL KNEE ARTHROPLASTY Left 05/27/2019   Procedure: LEFT TOTAL KNEE ARTHROPLASTY SAMD DAY DISCHARGE;  Surgeon: Frederik Pear, MD;  Location: [facility];  Service: Orthopedics;  Laterality: Left;    FAMILY HISTORY: Family History  Problem Relation Age of Onset   Alzheimer's disease Mother    Lung cancer Father    Heart disease Other    Asthma Other    Alzheimer's disease Maternal Aunt     SOCIAL HISTORY:  Social History   Socioeconomic History   Marital status: Married    Spouse name: Not on file   Number of children: 1   Years of education: some college   Highest education level: Not on file  Occupational History   Occupation: farm work - retired  Tobacco Use   Smoking status: Former    Packs/day: 0.00    Years: 0.00    Total pack years: 0.00    Types: Cigarettes    Start date: 1990   Smokeless tobacco: Never  Scientific laboratory technician Use: Never used  Substance and Sexual Activity   Alcohol use: No   Drug use: No   Sexual activity: Not on file  Other Topics Concern   Not on file  Social History Narrative   Lives at home with Parker.   Right-handed.   No  caffeine use.   Social Determinants of Health   Financial  Resource Strain: Not on file  Food Insecurity: Not on file  Transportation Needs: Not on file  Physical Activity: Not on file  Stress: Not on file  Social Connections: Not on file  Intimate Partner Violence: Not on file    Marcial Pacas, M.D. Ph.D.  [facility] [place], [place] [address] Phone: [phone] Fax:      [phone] Everything so you do not think with blood pressure

## 2022-06-21 ENCOUNTER — Other Ambulatory Visit: Payer: Self-pay | Admitting: Neurology

## 2022-07-02 DIAGNOSIS — F419 Anxiety disorder, unspecified: Secondary | ICD-10-CM | POA: Diagnosis not present

## 2022-07-06 DIAGNOSIS — H524 Presbyopia: Secondary | ICD-10-CM | POA: Diagnosis not present

## 2022-07-06 DIAGNOSIS — H52223 Regular astigmatism, bilateral: Secondary | ICD-10-CM | POA: Diagnosis not present

## 2022-07-06 DIAGNOSIS — H43813 Vitreous degeneration, bilateral: Secondary | ICD-10-CM | POA: Diagnosis not present

## 2022-09-04 DIAGNOSIS — I10 Essential (primary) hypertension: Secondary | ICD-10-CM | POA: Diagnosis not present

## 2022-09-04 DIAGNOSIS — E6609 Other obesity due to excess calories: Secondary | ICD-10-CM | POA: Diagnosis not present

## 2022-09-04 DIAGNOSIS — G3184 Mild cognitive impairment, so stated: Secondary | ICD-10-CM | POA: Diagnosis not present

## 2022-09-04 DIAGNOSIS — M1991 Primary osteoarthritis, unspecified site: Secondary | ICD-10-CM | POA: Diagnosis not present

## 2022-09-04 DIAGNOSIS — F321 Major depressive disorder, single episode, moderate: Secondary | ICD-10-CM | POA: Diagnosis not present

## 2022-09-04 DIAGNOSIS — E039 Hypothyroidism, unspecified: Secondary | ICD-10-CM | POA: Diagnosis not present

## 2022-09-04 DIAGNOSIS — Z0001 Encounter for general adult medical examination with abnormal findings: Secondary | ICD-10-CM | POA: Diagnosis not present

## 2022-09-04 DIAGNOSIS — Z6830 Body mass index (BMI) 30.0-30.9, adult: Secondary | ICD-10-CM | POA: Diagnosis not present

## 2022-09-04 DIAGNOSIS — E785 Hyperlipidemia, unspecified: Secondary | ICD-10-CM | POA: Diagnosis not present

## 2022-10-10 DIAGNOSIS — H524 Presbyopia: Secondary | ICD-10-CM | POA: Diagnosis not present

## 2022-11-05 DIAGNOSIS — M9901 Segmental and somatic dysfunction of cervical region: Secondary | ICD-10-CM | POA: Diagnosis not present

## 2022-11-05 DIAGNOSIS — M5031 Other cervical disc degeneration,  high cervical region: Secondary | ICD-10-CM | POA: Diagnosis not present

## 2022-12-20 DIAGNOSIS — M5031 Other cervical disc degeneration,  high cervical region: Secondary | ICD-10-CM | POA: Diagnosis not present

## 2022-12-20 DIAGNOSIS — M9901 Segmental and somatic dysfunction of cervical region: Secondary | ICD-10-CM | POA: Diagnosis not present

## 2023-01-02 DIAGNOSIS — F419 Anxiety disorder, unspecified: Secondary | ICD-10-CM | POA: Diagnosis not present

## 2023-01-03 DIAGNOSIS — M9901 Segmental and somatic dysfunction of cervical region: Secondary | ICD-10-CM | POA: Diagnosis not present

## 2023-01-03 DIAGNOSIS — M5031 Other cervical disc degeneration,  high cervical region: Secondary | ICD-10-CM | POA: Diagnosis not present

## 2023-02-04 DIAGNOSIS — M5031 Other cervical disc degeneration,  high cervical region: Secondary | ICD-10-CM | POA: Diagnosis not present

## 2023-02-04 DIAGNOSIS — M9901 Segmental and somatic dysfunction of cervical region: Secondary | ICD-10-CM | POA: Diagnosis not present

## 2023-05-14 ENCOUNTER — Other Ambulatory Visit (HOSPITAL_COMMUNITY): Payer: Self-pay | Admitting: Family Medicine

## 2023-05-14 DIAGNOSIS — Z1231 Encounter for screening mammogram for malignant neoplasm of breast: Secondary | ICD-10-CM

## 2023-05-15 DIAGNOSIS — M9901 Segmental and somatic dysfunction of cervical region: Secondary | ICD-10-CM | POA: Diagnosis not present

## 2023-05-15 DIAGNOSIS — M5031 Other cervical disc degeneration,  high cervical region: Secondary | ICD-10-CM | POA: Diagnosis not present

## 2023-05-23 ENCOUNTER — Encounter (HOSPITAL_COMMUNITY): Payer: Self-pay

## 2023-05-23 ENCOUNTER — Ambulatory Visit (HOSPITAL_COMMUNITY)
Admission: RE | Admit: 2023-05-23 | Discharge: 2023-05-23 | Disposition: A | Discharge status: Home / Self Care | Payer: Medicare HMO | Source: Ambulatory Visit | Attending: Family Medicine | Admitting: Family Medicine

## 2023-05-23 DIAGNOSIS — Z1231 Encounter for screening mammogram for malignant neoplasm of breast: Secondary | ICD-10-CM | POA: Diagnosis not present

## 2023-05-29 DIAGNOSIS — M9901 Segmental and somatic dysfunction of cervical region: Secondary | ICD-10-CM | POA: Diagnosis not present

## 2023-05-29 DIAGNOSIS — M5031 Other cervical disc degeneration,  high cervical region: Secondary | ICD-10-CM | POA: Diagnosis not present

## 2023-06-12 DIAGNOSIS — M9901 Segmental and somatic dysfunction of cervical region: Secondary | ICD-10-CM | POA: Diagnosis not present

## 2023-06-12 DIAGNOSIS — M5031 Other cervical disc degeneration,  high cervical region: Secondary | ICD-10-CM | POA: Diagnosis not present

## 2023-08-27 DIAGNOSIS — Z6830 Body mass index (BMI) 30.0-30.9, adult: Secondary | ICD-10-CM | POA: Diagnosis not present

## 2023-08-27 DIAGNOSIS — R7309 Other abnormal glucose: Secondary | ICD-10-CM | POA: Diagnosis not present

## 2023-08-27 DIAGNOSIS — G3184 Mild cognitive impairment, so stated: Secondary | ICD-10-CM | POA: Diagnosis not present

## 2023-08-27 DIAGNOSIS — E039 Hypothyroidism, unspecified: Secondary | ICD-10-CM | POA: Diagnosis not present

## 2023-08-27 DIAGNOSIS — E785 Hyperlipidemia, unspecified: Secondary | ICD-10-CM | POA: Diagnosis not present

## 2023-08-27 DIAGNOSIS — F419 Anxiety disorder, unspecified: Secondary | ICD-10-CM | POA: Diagnosis not present

## 2023-08-27 DIAGNOSIS — E6609 Other obesity due to excess calories: Secondary | ICD-10-CM | POA: Diagnosis not present

## 2023-08-27 DIAGNOSIS — I10 Essential (primary) hypertension: Secondary | ICD-10-CM | POA: Diagnosis not present

## 2023-09-27 DIAGNOSIS — E039 Hypothyroidism, unspecified: Secondary | ICD-10-CM | POA: Diagnosis not present

## 2023-09-27 DIAGNOSIS — R001 Bradycardia, unspecified: Secondary | ICD-10-CM | POA: Diagnosis not present

## 2023-09-27 DIAGNOSIS — I484 Atypical atrial flutter: Secondary | ICD-10-CM | POA: Diagnosis not present

## 2023-09-27 DIAGNOSIS — Z6830 Body mass index (BMI) 30.0-30.9, adult: Secondary | ICD-10-CM | POA: Diagnosis not present

## 2023-09-27 DIAGNOSIS — E6609 Other obesity due to excess calories: Secondary | ICD-10-CM | POA: Diagnosis not present

## 2023-09-27 DIAGNOSIS — Z9884 Bariatric surgery status: Secondary | ICD-10-CM | POA: Diagnosis not present

## 2023-11-01 ENCOUNTER — Encounter: Payer: Self-pay | Admitting: Internal Medicine

## 2023-11-01 ENCOUNTER — Ambulatory Visit: Attending: Internal Medicine | Admitting: Internal Medicine

## 2023-11-01 ENCOUNTER — Ambulatory Visit

## 2023-11-01 VITALS — BP 120/64 | HR 43 | Ht 65.5 in | Wt 180.0 lb

## 2023-11-01 DIAGNOSIS — I4891 Unspecified atrial fibrillation: Secondary | ICD-10-CM

## 2023-11-01 DIAGNOSIS — I4819 Other persistent atrial fibrillation: Secondary | ICD-10-CM | POA: Diagnosis not present

## 2023-11-01 DIAGNOSIS — I4892 Unspecified atrial flutter: Secondary | ICD-10-CM | POA: Diagnosis not present

## 2023-11-01 MED ORDER — HYDROCHLOROTHIAZIDE 25 MG PO TABS: 12.5000 mg | ORAL_TABLET | Freq: Every day | ORAL | 3 refills | Status: DC

## 2023-11-01 MED ORDER — APIXABAN 5 MG PO TABS: 5.0000 mg | ORAL_TABLET | Freq: Two times a day (BID) | ORAL | 0 refills | Status: DC

## 2023-11-01 NOTE — Patient Instructions (Signed)
 Medication Instructions:  Your physician has recommended you make the following change in your medication:   -Stop Bisoprolol -Start Eliquis 5 mg twice daily   *If you need a refill on your cardiac medications before your next appointment, please call your pharmacy*  Lab Work: None  If you have labs (blood work) drawn today and your tests are completely normal, you will receive your results only by: MyChart Message (if you have MyChart) OR A paper copy in the mail If you have any lab test that is abnormal or we need to change your treatment, we will call you to review the results.  Testing/Procedures: Your physician has requested that you have an echocardiogram. Echocardiography is a painless test that uses sound waves to create images of your heart. It provides your doctor with information about the size and shape of your heart and how well your heart's chambers and valves are working. This procedure takes approximately one hour. There are no restrictions for this procedure. Please do NOT wear cologne, perfume, aftershave, or lotions (deodorant is allowed). Please arrive 15 minutes prior to your appointment time.  Please note: We ask at that you not bring children with you during ultrasound (echo/ vascular) testing. Due to room size and safety concerns, children are not allowed in the ultrasound rooms during exams. Our front office staff cannot provide observation of children in our lobby area while testing is being conducted. An adult accompanying a patient to their appointment will only be allowed in the ultrasound room at the discretion of the ultrasound technician under special circumstances. We apologize for any inconvenience.   Follow-Up: At [facility], you and your health needs are our priority.  As part of our continuing mission to provide you with exceptional heart care, our providers are all part of one team.  This team includes your primary Cardiologist (physician) and  Advanced Practice Providers or APPs (Physician Assistants and Nurse Practitioners) who all work together to provide you with the care you need, when you need it.  Your next appointment:   6 week(s)  Provider:   You may see Vishnu P Mallipeddi, MD or one of the following Advanced Practice Providers on your designated Care Team:   [place], PA-C  [facility], [place] Olivia Pavy, [place]     We recommend signing up for the patient portal called MyChart.  Sign up information is provided on this After Visit Summary.  MyChart is used to connect with patients for Virtual Visits (Telemedicine).  Patients are able to view lab/test results, encounter notes, upcoming appointments, etc.  Non-urgent messages can be sent to your provider as well.   To learn more about what you can do with MyChart, go to [email or web address].   Other Instructions ZIO XT- Long Term Monitor Instructions   Your physician has requested you wear your ZIO patch monitor___14____days.   This is a single patch monitor.  [employer] supplies one patch monitor per enrollment.  Additional stickers are not available.   Please do not apply patch if you will be having a Nuclear Stress Test, Echocardiogram, Cardiac CT, MRI, or Chest Xray during the time frame you would be wearing the monitor. The patch cannot be worn during these tests.  You cannot remove and re-apply the ZIO XT patch monitor.   Your ZIO patch monitor will be sent USPS Priority mail from [facility] directly to your home address. The monitor may also be mailed to a PO BOX if home delivery is not available.  It may take 3-5 days to receive your monitor after you have been enrolled.   Once you have received you monitor, please review enclosed instructions.  Your monitor has already been registered assigning a specific monitor serial # to you.   Applying the monitor   Shave hair from upper left chest.   Hold abrader disc by orange tab.  Rub abrader  in 40 strokes over left upper chest as indicated in your monitor instructions.   Clean area with 4 enclosed alcohol pads .  Use all pads to assure are is cleaned thoroughly.  Let dry.   Apply patch as indicated in monitor instructions.  Patch will be place under collarbone on left side of chest with arrow pointing upward.   Rub patch adhesive wings for 2 minutes.Remove white label marked 1.  Remove white label marked 2.  Rub patch adhesive wings for 2 additional minutes.   While looking in a mirror, press and release button in center of patch.  A small green light will flash 3-4 times .  This will be your only indicator the monitor has been turned on.     Do not shower for the first 24 hours.  You may shower after the first 24 hours.   Press button if you feel a symptom. You will hear a small click.  Record Date, Time and Symptom in the Patient Log Book.   When you are ready to remove patch, follow instructions on last 2 pages of Patient Log Book.  Stick patch monitor onto last page of Patient Log Book.   Place Patient Log Book in [place] box.  Use locking tab on box and tape box closed securely.  The [employer] and [employer] has [employer] on it.  Please place in mailbox as soon as possible.  Your physician should have your test results approximately 7 days after the monitor has been mailed back to [facility].   Call [facility] Customer Care at [phone] if you have questions regarding your ZIO XT patch monitor.  Call them immediately if you see an orange light blinking on your monitor.   If your monitor falls off in less than 4 days contact our Monitor department at [phone].  If your monitor becomes loose or falls off after 4 days call [employer] at [phone] for suggestions on securing your monitor.

## 2023-11-01 NOTE — Progress Notes (Signed)
 Cardiology Office Note  Date: 11/01/2023   ID: Lisa Parker, DOB [birth year], MRN [id number]  PCP:  Marvine Rush, MD  Cardiologist:  None Electrophysiologist:  None   History of Present Illness: Lisa Parker is a 78 y.o. female  Referred to cardiology clinic for evaluation of coarse atrial fibrillation with slow ventricular response. EKG today showed atrial fibrillation with slow ventricle response, HR 43 bpm.  She does not have any symptoms of dizziness, lightheadedness, syncope but she did report severe fatigue for the last 2 months which was when she was newly diagnosed with atrial fibrillation.  She does not have any angina or DOE.  She underwent gastric bypass surgery a decade ago and lost around 110 pounds of weight.  Past Medical History:  Diagnosis Date   Anxiety    Arthritis    Asthma    HTN (hypertension)    i had a gastric sleeve done and lost 100 pounds and i dont have htn anymore, my doctor, Dr Marvine took me off of it     Hypothyroid    Memory loss    Seasonal allergies    Sinus bradycardia    Urinary tract infection     Past Surgical History:  Procedure Laterality Date   BLEPHAROPLASTY Bilateral    CATARACT EXTRACTION, BILATERAL     with lens    CESAREAN SECTION     FINGER SURGERY     left pinky   HERNIA REPAIR  2004   inguinal , incisional, navel    KNEE SURGERY Left    arthroscopic    LAPAROSCOPIC GASTRIC SLEEVE RESECTION     MASS EXCISION Left 10/20/2015   Procedure: EXCISION MASS LEFT SMALL FINGER;  Surgeon: Franky Curia, MD;  Location: [facility];  Service: Orthopedics;  Laterality: Left;   TONSILLECTOMY AND ADENOIDECTOMY     TOTAL KNEE ARTHROPLASTY Right 11/24/2018   Procedure: Right Knee Arthroplasty;  Surgeon: Liam Lerner, MD;  [facility];  Service: Orthopedics;  Laterality: Right;   TOTAL KNEE ARTHROPLASTY Left 05/27/2019   Procedure: LEFT TOTAL KNEE ARTHROPLASTY SAMD DAY DISCHARGE;  Surgeon: Liam Lerner, MD;   [facility];  Service: Orthopedics;  Laterality: Left;    Current Outpatient Medications  Medication Sig Dispense Refill   ALPRAZolam  (XANAX ) 0.5 MG tablet Take 0.5 mg by mouth at bedtime.     ascorbic acid (VITAMIN C) 500 MG tablet Take 500 mg by mouth daily.     bisoprolol-hydrochlorothiazide (ZIAC) 5-6.25 MG tablet Take 1 tablet by mouth daily.     budesonide-formoterol  (SYMBICORT) 160-4.5 MCG/ACT inhaler Inhale 2 puffs into the lungs 2 (two) times daily as needed (shortness of breath).     Calcium Carb-Cholecalciferol  (CALCIUM 600 + D PO) Take 1 tablet by mouth daily.     Cholecalciferol  (VITAMIN D ) 50 MCG (2000 UT) tablet Take 2,000 Units by mouth daily.     donepezil  (ARICEPT ) 10 MG tablet TAKE ONE TABLET BY MOUTH AT BEDTIME 90 tablet 4   DULoxetine (CYMBALTA) 20 MG capsule Take 20 mg by mouth daily.     furosemide  (LASIX ) 20 MG tablet Take 20 mg by mouth as needed.     levothyroxine  (SYNTHROID ) 125 MCG tablet Take 150 mcg by mouth daily before breakfast.     memantine  (NAMENDA ) 10 MG tablet TAKE  (1) TABLET TWICE A DAY. 180 tablet 4   Multiple Vitamin (MULTIVITAMIN WITH MINERALS) TABS tablet Take 1 tablet by mouth daily.     oxybutynin  (DITROPAN   XL) 15 MG 24 hr tablet Take 15 mg by mouth daily.     SUPER B COMPLEX/C PO Take 2 tablets by mouth daily.     Omega-3 Fatty Acids (OMEGA 3 500) 500 MG CAPS Take 500 mg by mouth daily. (Patient not taking: Reported on 11/01/2023)     No current facility-administered medications for this visit.   Allergies:  Mold extract  [trichophyton], Pollen extract, Cucumber extract, and Other   Social History: The patient  reports that she has quit smoking. Her smoking use included cigarettes. She started smoking about 35 years ago. She has never used smokeless tobacco. She reports that she does not drink alcohol and does not use drugs.   Family History: The patient's family history includes Alzheimer's disease in her maternal aunt and mother; Asthma  in an other family member; Heart disease in an other family member; Lung cancer in her father.   ROS:  Please see the history of present illness. Otherwise, complete review of systems is positive for none.  All other systems are reviewed and negative.   Physical Exam: VS:  BP 120/64   Pulse (!) 43   Ht 5' 5.5 (1.664 m)   Wt 180 lb (81.6 kg)   SpO2 98%   BMI 29.50 kg/m , BMI Body mass index is 29.5 kg/m.  Wt Readings from Last 3 Encounters:  11/01/23 180 lb (81.6 kg)  05/31/22 175 lb (79.4 kg)  05/30/21 178 lb (80.7 kg)    General: Patient appears comfortable at rest. HEENT: Conjunctiva and lids normal, oropharynx clear with moist mucosa. Neck: Supple, no elevated JVP or carotid bruits, no thyromegaly. Lungs: Clear to auscultation, nonlabored breathing at rest. Cardiac: Regular rate and rhythm, no S3 or significant systolic murmur, no pericardial rub. Abdomen: Soft, nontender, no hepatomegaly, bowel sounds present, no guarding or rebound. Extremities: No pitting edema, distal pulses 2+. Skin: Warm and dry. Musculoskeletal: No kyphosis. Neuropsychiatric: Alert and oriented x3, affect grossly appropriate.  Recent Labwork: No results found for requested labs within last 365 days.  No results found for: CHOL, TRIG, HDL, CHOLHDL, VLDL, LDLCALC, LDLDIRECT   Assessment and Plan:  Persistent atrial fibrillation with slow ventricular response: Asymptomatic except for severe fatigue.  Newly diagnosed in the last 2 months.  EKG today showed A-fib with slow ventricular response, HR 43 bpm.  Currently on bisoprolol 5 mg once daily, will discontinue.  Avoid AV nodal blockers.  Will mail 2-week event monitor to evaluate the HR trends.  Start Eliquis 5 mg twice daily.  Obtain echocardiogram.  Discussed home sleep study testing, patient said she will think about it.  HTN, controlled: Initially on bisoprolol-HCTZ 5-6.25 mg once daily.  Discontinue bisoprolol and continue HCTZ 6.5  mg once daily.    Medication Adjustments/Labs and Tests Ordered: Current medicines are reviewed at length with the patient today.  Concerns regarding medicines are outlined above.    Disposition:  Follow up 6 weeks  Signed, Pyper Olexa Arleta Maywood, MD, 11/01/2023 2:19 PM    [facility] at [facility] [address] [place], [place] [address]

## 2023-11-28 DIAGNOSIS — I4891 Unspecified atrial fibrillation: Secondary | ICD-10-CM | POA: Diagnosis not present

## 2023-11-29 ENCOUNTER — Ambulatory Visit (HOSPITAL_COMMUNITY)
Admission: RE | Admit: 2023-11-29 | Discharge: 2023-11-29 | Disposition: A | Discharge status: Home / Self Care | Source: Ambulatory Visit | Attending: Internal Medicine | Admitting: Internal Medicine

## 2023-11-29 DIAGNOSIS — I4891 Unspecified atrial fibrillation: Secondary | ICD-10-CM | POA: Insufficient documentation

## 2023-11-29 LAB — ECHOCARDIOGRAM COMPLETE
AR max vel: 2.32 cm2
AV Area VTI: 2.21 cm2
AV Area mean vel: 2.21 cm2
AV Mean grad: 4 mmHg
AV Peak grad: 6.7 mmHg
Ao pk vel: 1.29 m/s
Calc EF: 56.7 %
MV VTI: 2.48 cm2
S' Lateral: 2.5 cm
Single Plane A2C EF: 53.6 %
Single Plane A4C EF: 64.7 %

## 2023-12-05 ENCOUNTER — Ambulatory Visit: Payer: Self-pay | Admitting: Internal Medicine

## 2023-12-06 NOTE — Telephone Encounter (Signed)
 The patient has been notified of the result and verbalized understanding.  All questions (if any) were answered. Bernett Dorothyann LABOR, RN 12/06/2023 11:39 AM    PCP copied

## 2023-12-06 NOTE — Telephone Encounter (Signed)
 Pt is returning call.

## 2023-12-11 DIAGNOSIS — I4891 Unspecified atrial fibrillation: Secondary | ICD-10-CM

## 2023-12-25 ENCOUNTER — Telehealth: Payer: Self-pay | Admitting: Internal Medicine

## 2023-12-25 NOTE — Telephone Encounter (Signed)
-----   Message from Vishnu P Mallipeddi sent at 12/24/2023  1:28 PM EDT ----- 100% A-fib burden, average HR 68 bpm.  Symptomatic with A-fib.  Needs DCCV and hence cardiology f/u.  Please schedule in 4-6 weeks.   Looks like she is initially scheduled for October 2025, unclear why it is canceled. ----- Message ----- From: Mallipeddi, Vishnu P, MD Sent: 12/11/2023  11:53 AM EDT To: Vishnu P Mallipeddi, MD

## 2023-12-25 NOTE — Telephone Encounter (Signed)
 Patient states she is returning a phone call from today.

## 2023-12-27 NOTE — Telephone Encounter (Signed)
 Has apt 01/01/24 with Dr.Mallipeddi

## 2023-12-30 DIAGNOSIS — I484 Atypical atrial flutter: Secondary | ICD-10-CM | POA: Diagnosis not present

## 2023-12-30 DIAGNOSIS — E039 Hypothyroidism, unspecified: Secondary | ICD-10-CM | POA: Diagnosis not present

## 2024-01-01 ENCOUNTER — Ambulatory Visit: Admitting: Internal Medicine

## 2024-01-01 DIAGNOSIS — Z961 Presence of intraocular lens: Secondary | ICD-10-CM | POA: Diagnosis not present

## 2024-01-01 DIAGNOSIS — H26493 Other secondary cataract, bilateral: Secondary | ICD-10-CM | POA: Diagnosis not present

## 2024-01-01 DIAGNOSIS — H43813 Vitreous degeneration, bilateral: Secondary | ICD-10-CM | POA: Diagnosis not present

## 2024-01-01 DIAGNOSIS — H52223 Regular astigmatism, bilateral: Secondary | ICD-10-CM | POA: Diagnosis not present

## 2024-01-01 DIAGNOSIS — H524 Presbyopia: Secondary | ICD-10-CM | POA: Diagnosis not present

## 2024-01-20 ENCOUNTER — Telehealth: Payer: Self-pay | Admitting: Internal Medicine

## 2024-01-20 NOTE — Telephone Encounter (Signed)
 Patient would like to switch to the [place] office. Please advise

## 2024-02-10 ENCOUNTER — Ambulatory Visit: Admitting: Internal Medicine

## 2024-03-10 ENCOUNTER — Ambulatory Visit: Admitting: Internal Medicine

## 2024-03-12 ENCOUNTER — Other Ambulatory Visit (HOSPITAL_COMMUNITY): Payer: Self-pay

## 2024-03-12 ENCOUNTER — Encounter: Payer: Self-pay | Admitting: Internal Medicine

## 2024-03-12 ENCOUNTER — Ambulatory Visit: Attending: Internal Medicine | Admitting: Internal Medicine

## 2024-03-12 VITALS — BP 120/72 | HR 70 | Ht 65.5 in | Wt 174.0 lb

## 2024-03-12 DIAGNOSIS — I4819 Other persistent atrial fibrillation: Secondary | ICD-10-CM

## 2024-03-12 DIAGNOSIS — Z01812 Encounter for preprocedural laboratory examination: Secondary | ICD-10-CM | POA: Diagnosis not present

## 2024-03-12 DIAGNOSIS — I4891 Unspecified atrial fibrillation: Secondary | ICD-10-CM | POA: Diagnosis not present

## 2024-03-12 DIAGNOSIS — I10 Essential (primary) hypertension: Secondary | ICD-10-CM | POA: Diagnosis not present

## 2024-03-12 LAB — CBC

## 2024-03-12 MED ORDER — APIXABAN 5 MG PO TABS
5.0000 mg | ORAL_TABLET | Freq: Two times a day (BID) | ORAL | 0 refills | Status: DC
  Filled 2024-03-12: qty 60, 30d supply, fill #0

## 2024-03-12 MED ORDER — APIXABAN 5 MG PO TABS
5.0000 mg | ORAL_TABLET | Freq: Two times a day (BID) | ORAL | 6 refills | Status: AC
  Filled 2024-03-12: qty 60, 30d supply, fill #0

## 2024-03-12 NOTE — Progress Notes (Signed)
 Cardiology Office Note   Date:  03/12/2024  ID:  Lisa Parker, DOB [birth year], MRN [id number] PCP: Marvine Rush, MD  [facility] Providers Cardiologist:  Emeline FORBES Calender, MD     History of Present Illness Lisa Parker is a 78 y.o. female who presents with her daughter with a past medical history of hypertension, hypothyroidism, gastric sleeve, persistent atrial fibrillation/flutter with slow ventricular response who presents today for follow-up.  Patient was last seen on 11/01/2023 with Dr. Mallipeddi and at that time was noted to have persistent atrial fibrillation with slow ventricular response that was diagnosed within 2 months of that visit.  Heart rate was 43 bpm.  Patient was noted to be on bisoprolol 5 mg daily which was discontinued and started on Eliquis  5 mg twice daily.  An echocardiogram and 2-week event monitor were ordered.  Patient was found to have a 100% atrial fibrillation burden with an average heart rate of 68 bpm and concern for severe fatigue related to symptomatic atrial fibrillation.  She presents today to switch care to the [facility] office and to discuss undergoing a cardioversion.  Patient and daughter were very concerned as they believe there was a lot of miscommunication regarding her care.  She was set up for cardioversion however did not have any discussion previously regarding her cardiac workup and therefore canceled the appointment until she was able to discuss with the cardiologist, prompting the discussion today.  We had an extensive discussion today regarding her echocardiogram results, event monitor results, diagnosis of atrial fibrillation and management options.  We discussed anticoagulation and stroke prophylaxis.  She is supposed to be on Eliquis  5 mg twice daily but is not sure if she has been taking it or not.  She also was supposed to stop taking bisoprolol-hydrochlorothiazide  and continue on hydrochlorothiazide  monotherapy due to slow rates in  the past.  She has instead continued on bisoprolol and not hydrochlorothiazide  but rates are well-controlled today.  Any and all questions and concerns were addressed today and she felt much better by the end of the visit.  She has had some exertional fatigue and rarely some brief palpitations but she relates this to her age.  No chest pain or any other significant symptoms.  ROS:  Review of Systems  All other systems reviewed and are negative.   Physical Exam  Physical Exam Vitals and nursing note reviewed.  Constitutional:      Appearance: Normal appearance.  HENT:     Head: Normocephalic and atraumatic.  Eyes:     Conjunctiva/sclera: Conjunctivae normal.  Neck:     Vascular: No carotid bruit.  Cardiovascular:     Rate and Rhythm: Normal rate. Rhythm irregular.  Pulmonary:     Effort: Pulmonary effort is normal.     Breath sounds: Normal breath sounds.  Musculoskeletal:        General: No swelling or tenderness.  Skin:    Coloration: Skin is not jaundiced or pale.  Neurological:     Mental Status: She is alert.     VS:  BP 120/72   Pulse 70   Ht 5' 5.5 (1.664 m)   Wt 174 lb (78.9 kg)   SpO2 98%   BMI 28.51 kg/m         Wt Readings from Last 3 Encounters:  03/12/24 174 lb (78.9 kg)  11/01/23 180 lb (81.6 kg)  05/31/22 175 lb (79.4 kg)     EKG Interpretation Date/Time:    Ventricular Rate:  PR Interval:    QRS Duration:    QT Interval:    QTC Calculation:   R Axis:      Text Interpretation:      Studies Reviewed    Echocardiogram 11/29/2023:  1. Left ventricular ejection fraction, by estimation, is 55 to 60%. The  left ventricle has normal function. The left ventricle has no regional  wall motion abnormalities. There is mild concentric left ventricular  hypertrophy. Left ventricular diastolic  function could not be evaluated.   2. Right ventricular systolic function is normal. The right ventricular  size is normal. Tricuspid regurgitation  signal is inadequate for assessing  PA pressure.   3. The mitral valve is normal in structure. Trivial mitral valve  regurgitation. No evidence of mitral stenosis.   4. The aortic valve is tricuspid. Aortic valve regurgitation is not  visualized. Aortic valve sclerosis/calcification is present, without any  evidence of aortic stenosis. Aortic valve area, by VTI measures 2.21 cm.  Aortic valve mean gradient measures  4.0 mmHg. Aortic valve Vmax measures 1.29 m/s.   5. The inferior vena cava is normal in size with greater than 50%  respiratory variability, suggesting right atrial pressure of 3 mmHg.    2-week event monitor 11/29/2023:   Patch wear time was for 14 days.   100% A-fib burden ranging from 37 to 162 bpm with an average HR 68 bpm.   No evidence of ventricular arrhythmias, high-grade AV block or pauses.   No PAC burden and 1.2% PVC burden.   Patient triggered events correlated with atrial fibrillation (57 to 100 bpm) and PVC.     Risk Assessment/Calculations   CHA2DS2-VASc Score = 4   This indicates a 4.8% annual risk of stroke. The patient's score is based upon: CHF History: 0 HTN History: 1 Diabetes History: 0 Stroke History: 0 Vascular Disease History: 0 Age Score: 2 Gender Score: 1             ASCVD risk score: The ASCVD Risk score (Arnett DK, et al., 2019) failed to calculate for the following reasons:   Cannot find a previous HDL lab   Cannot find a previous total cholesterol lab   ASSESSMENT  Persistent atrial fibrillation CHA2DS2-VASc: 4 and unclear if she is taking Eliquis  5 mg twice daily or not. It seems there is some confusion as to if she is on rate controlling meds or not (bisoprolol was stopped at a prior visit but she states she has been taking it).  Rate is controlled today.  Previously with slow ventricular response.  Event monitor shows 100% A-fib burden and echocardiogram is overall unremarkable.   Exertional fatigue, possibly related to  A-fib versus age and deconditioning Hypertension Hypothyroidism S/p gastric sleeve   Plan  Extensive discussion regarding risks/benefits of cardioversion and importance of taking twice daily Eliquis .  Any and all questions and concerns were addressed She is willing to proceed with a plain cardioversion and getting shot at normal sinus rhythm.  Will set up a cardioversion for at least 3 weeks from now since her stroke prophylaxis management is unclear and she was strongly advised to take Eliquis  twice daily as prescribed.  She understands this and is in agreement.  She is going to go home and check her medications and give us  a call with the exact medications she has been taking. Eliquis  is on her med list and she likely has some at home but if she does not then we can prescribe it. She  is going to work on getting her MyChart set up Cardiac risk counseling and prevention recommendations: Heart healthy/Mediterranean diet with whole grains, fruits, vegetable, fish, lean meats, nuts, and olive oil. Limit salt. Moderate walking, 3-5 times/week for 30-50 minutes each session. Aim for at least 150 minutes.week. Goal should be pace of 3 miles/hour, or walking 1.5 miles in 30 minutes Avoidance of tobacco products. Avoid excess alcohol.  Follow up: 2 to 3 months     Informed Consent   Shared Decision Making/Informed Consent The risks (stroke, cardiac arrhythmias rarely resulting in the need for a temporary or permanent pacemaker, skin irritation or burns and complications associated with conscious sedation including aspiration, arrhythmia, respiratory failure and death), benefits (restoration of normal sinus rhythm) and alternatives of a direct current cardioversion were explained in detail to Ms. Leon and she agrees to proceed.         Signed, Emeline FORBES Calender, MD

## 2024-03-12 NOTE — H&P (View-Only) (Signed)
 Cardiology Office Note   Date:  03/12/2024  ID:  Lisa Parker, DOB [birth year], MRN [id number] PCP: Marvine Rush, MD  [facility] Providers Cardiologist:  Emeline FORBES Calender, MD     History of Present Illness Lisa Parker is a 78 y.o. female who presents with her daughter with a past medical history of hypertension, hypothyroidism, gastric sleeve, persistent atrial fibrillation/flutter with slow ventricular response who presents today for follow-up.  Patient was last seen on 11/01/2023 with Dr. Mallipeddi and at that time was noted to have persistent atrial fibrillation with slow ventricular response that was diagnosed within 2 months of that visit.  Heart rate was 43 bpm.  Patient was noted to be on bisoprolol 5 mg daily which was discontinued and started on Eliquis  5 mg twice daily.  An echocardiogram and 2-week event monitor were ordered.  Patient was found to have a 100% atrial fibrillation burden with an average heart rate of 68 bpm and concern for severe fatigue related to symptomatic atrial fibrillation.  She presents today to switch care to the [facility] office and to discuss undergoing a cardioversion.  Patient and daughter were very concerned as they believe there was a lot of miscommunication regarding her care.  She was set up for cardioversion however did not have any discussion previously regarding her cardiac workup and therefore canceled the appointment until she was able to discuss with the cardiologist, prompting the discussion today.  We had an extensive discussion today regarding her echocardiogram results, event monitor results, diagnosis of atrial fibrillation and management options.  We discussed anticoagulation and stroke prophylaxis.  She is supposed to be on Eliquis  5 mg twice daily but is not sure if she has been taking it or not.  She also was supposed to stop taking bisoprolol-hydrochlorothiazide  and continue on hydrochlorothiazide  monotherapy due to slow rates in  the past.  She has instead continued on bisoprolol and not hydrochlorothiazide  but rates are well-controlled today.  Any and all questions and concerns were addressed today and she felt much better by the end of the visit.  She has had some exertional fatigue and rarely some brief palpitations but she relates this to her age.  No chest pain or any other significant symptoms.  ROS:  Review of Systems  All other systems reviewed and are negative.   Physical Exam  Physical Exam Vitals and nursing note reviewed.  Constitutional:      Appearance: Normal appearance.  HENT:     Head: Normocephalic and atraumatic.  Eyes:     Conjunctiva/sclera: Conjunctivae normal.  Neck:     Vascular: No carotid bruit.  Cardiovascular:     Rate and Rhythm: Normal rate. Rhythm irregular.  Pulmonary:     Effort: Pulmonary effort is normal.     Breath sounds: Normal breath sounds.  Musculoskeletal:        General: No swelling or tenderness.  Skin:    Coloration: Skin is not jaundiced or pale.  Neurological:     Mental Status: She is alert.     VS:  BP 120/72   Pulse 70   Ht 5' 5.5 (1.664 m)   Wt 174 lb (78.9 kg)   SpO2 98%   BMI 28.51 kg/m         Wt Readings from Last 3 Encounters:  03/12/24 174 lb (78.9 kg)  11/01/23 180 lb (81.6 kg)  05/31/22 175 lb (79.4 kg)     EKG Interpretation Date/Time:    Ventricular Rate:  PR Interval:    QRS Duration:    QT Interval:    QTC Calculation:   R Axis:      Text Interpretation:      Studies Reviewed    Echocardiogram 11/29/2023:  1. Left ventricular ejection fraction, by estimation, is 55 to 60%. The  left ventricle has normal function. The left ventricle has no regional  wall motion abnormalities. There is mild concentric left ventricular  hypertrophy. Left ventricular diastolic  function could not be evaluated.   2. Right ventricular systolic function is normal. The right ventricular  size is normal. Tricuspid regurgitation  signal is inadequate for assessing  PA pressure.   3. The mitral valve is normal in structure. Trivial mitral valve  regurgitation. No evidence of mitral stenosis.   4. The aortic valve is tricuspid. Aortic valve regurgitation is not  visualized. Aortic valve sclerosis/calcification is present, without any  evidence of aortic stenosis. Aortic valve area, by VTI measures 2.21 cm.  Aortic valve mean gradient measures  4.0 mmHg. Aortic valve Vmax measures 1.29 m/s.   5. The inferior vena cava is normal in size with greater than 50%  respiratory variability, suggesting right atrial pressure of 3 mmHg.    2-week event monitor 11/29/2023:   Patch wear time was for 14 days.   100% A-fib burden ranging from 37 to 162 bpm with an average HR 68 bpm.   No evidence of ventricular arrhythmias, high-grade AV block or pauses.   No PAC burden and 1.2% PVC burden.   Patient triggered events correlated with atrial fibrillation (57 to 100 bpm) and PVC.     Risk Assessment/Calculations   CHA2DS2-VASc Score = 4   This indicates a 4.8% annual risk of stroke. The patient's score is based upon: CHF History: 0 HTN History: 1 Diabetes History: 0 Stroke History: 0 Vascular Disease History: 0 Age Score: 2 Gender Score: 1             ASCVD risk score: The ASCVD Risk score (Arnett DK, et al., 2019) failed to calculate for the following reasons:   Cannot find a previous HDL lab   Cannot find a previous total cholesterol lab   ASSESSMENT  Persistent atrial fibrillation CHA2DS2-VASc: 4 and unclear if she is taking Eliquis  5 mg twice daily or not. It seems there is some confusion as to if she is on rate controlling meds or not (bisoprolol was stopped at a prior visit but she states she has been taking it).  Rate is controlled today.  Previously with slow ventricular response.  Event monitor shows 100% A-fib burden and echocardiogram is overall unremarkable.   Exertional fatigue, possibly related to  A-fib versus age and deconditioning Hypertension Hypothyroidism S/p gastric sleeve   Plan  Extensive discussion regarding risks/benefits of cardioversion and importance of taking twice daily Eliquis .  Any and all questions and concerns were addressed She is willing to proceed with a plain cardioversion and getting shot at normal sinus rhythm.  Will set up a cardioversion for at least 3 weeks from now since her stroke prophylaxis management is unclear and she was strongly advised to take Eliquis  twice daily as prescribed.  She understands this and is in agreement.  She is going to go home and check her medications and give us  a call with the exact medications she has been taking. Eliquis  is on her med list and she likely has some at home but if she does not then we can prescribe it. She  is going to work on getting her MyChart set up Cardiac risk counseling and prevention recommendations: Heart healthy/Mediterranean diet with whole grains, fruits, vegetable, fish, lean meats, nuts, and olive oil. Limit salt. Moderate walking, 3-5 times/week for 30-50 minutes each session. Aim for at least 150 minutes.week. Goal should be pace of 3 miles/hour, or walking 1.5 miles in 30 minutes Avoidance of tobacco products. Avoid excess alcohol.  Follow up: 2 to 3 months     Informed Consent   Shared Decision Making/Informed Consent The risks (stroke, cardiac arrhythmias rarely resulting in the need for a temporary or permanent pacemaker, skin irritation or burns and complications associated with conscious sedation including aspiration, arrhythmia, respiratory failure and death), benefits (restoration of normal sinus rhythm) and alternatives of a direct current cardioversion were explained in detail to Ms. Leon and she agrees to proceed.         Signed, Emeline FORBES Calender, MD

## 2024-03-12 NOTE — Patient Instructions (Addendum)
 Medication Instructions:  Your physician recommends that you continue on your current medications as directed. Please refer to the Current Medication list given to you today.  *If you need a refill on your cardiac medications before your next appointment, please call your pharmacy*  Lab Work: Pre procedure labs today: BMET & CBC  If you have any lab test that is abnormal or we need to change your treatment, we will call you to review the results.  Testing/Procedures: Your physician has recommended that you have a Cardioversion (DCCV). Electrical Cardioversion uses a jolt of electricity to your heart either through paddles or wired patches attached to your chest. This is a controlled, usually prescheduled, procedure. Defibrillation is done under light anesthesia in the hospital, and you usually go home the day of the procedure. This is done to get your heart back into a normal rhythm. You are not awake for the procedure. Please see the instruction sheet below    Follow-Up: At [facility], you and your health needs are our priority.  As part of our continuing mission to provide you with exceptional heart care, our providers are all part of one team.  This team includes your primary Cardiologist (physician) and Advanced Practice Providers or APPs (Physician Assistants and Nurse Practitioners) who all work together to provide you with the care you need, when you need it.  Your next appointment:   6 month(s)  Provider:   Emeline FORBES Calender, MD    We recommend signing up for the patient portal called MyChart.  Sign up information is provided on this After Visit Summary.  MyChart is used to connect with patients for Virtual Visits (Telemedicine).  Patients are able to view lab/test results, encounter notes, upcoming appointments, etc.  Non-urgent messages can be sent to your provider as well.   To learn more about what you can do with MyChart, go to [email or web address].        [email or web address]   Thank you for choosing [facility]!!   [phone]   Other Instructions:      Dear Lisa Parker  You are scheduled for a Cardioversion on Wednesday, December 3 with Dr. Sheena.  Please arrive at the [facility] (Main Entrance A) at [facility]: [address] [place], [place] [address] at 7:30 AM (This time is 1 hour(s) before your procedure to ensure your preparation).   Free valet parking service is available. You will check in at ADMITTING.   *Please Note: You will receive a call the day before your procedure to confirm the appointment time. That time may have changed from the original time based on the schedule for that day.*    DIET:  Nothing to eat or drink after midnight except a sip of water  with medications (see medication instructions below)  MEDICATION INSTRUCTIONS: !!IF ANY NEW MEDICATIONS ARE STARTED AFTER TODAY, PLEASE NOTIFY YOUR PROVIDER AS SOON AS POSSIBLE!!  FYI: Medications such as Semaglutide (Ozempic, Wegovy), Tirzepatide (Mounjaro, Zepbound), Dulaglutide (Trulicity), etc (GLP1 agonists) AND Canagliflozin (Invokana), Dapagliflozin (Farxiga), Empagliflozin (Jardiance), Ertugliflozin (Steglatro), Bexagliflozin [employer]) or any combination with one of these drugs such as Invokamet (Canagliflozin/Metformin), Synjardy (Empagliflozin/Metformin), etc (SGLT2 inhibitors) must be held around the time of a procedure. This is not a comprehensive list of all of these drugs. Please review all of your medications and talk to your provider if you take any one of these. If you are not sure, ask your provider.   Hold your morning medications -- ONLY TAKE  YOUR ELIQUIS    Continue taking your anticoagulant (blood thinner): Apixaban  (Eliquis ).  You will need to continue this after your procedure until you are told by your provider that it is safe to stop.    LABS:  today, 11/6 Come to  the lab at the [facility] ([address], [place], 1st Floor) between the hours of 8:00 am and 4:30 pm. You do NOT have to be fasting.  FYI:  For your safety, and to allow us  to monitor your vital signs accurately during the surgery/procedure we request: If you have artificial nails, gel coating, SNS etc, please have those removed prior to your surgery/procedure. Not having the nail coverings /polish removed may result in cancellation or delay of your surgery/procedure.  Your support person will be asked to wait in the waiting room during your procedure.  It is OK to have someone drop you off and come back when you are ready to be discharged.  You cannot drive after the procedure and will need someone to drive you home.  Bring your insurance cards.  *Special Note: Every effort is made to have your procedure done on time. Occasionally there are emergencies that occur at the hospital that may cause delays. Please be patient if a delay does occur.

## 2024-03-13 LAB — CBC
Hematocrit: 47.8 % — AB (ref 34.0–46.6)
Hemoglobin: 15.5 g/dL (ref 11.1–15.9)
MCH: 29.8 pg (ref 26.6–33.0)
MCHC: 32.4 g/dL (ref 31.5–35.7)
MCV: 92 fL (ref 79–97)
Platelets: 202 x10E3/uL (ref 150–450)
RBC: 5.21 x10E6/uL (ref 3.77–5.28)
RDW: 12.9 % (ref 11.7–15.4)
WBC: 5.6 x10E3/uL (ref 3.4–10.8)

## 2024-03-13 LAB — BASIC METABOLIC PANEL WITH GFR
BUN/Creatinine Ratio: 23 (ref 12–28)
BUN: 21 mg/dL (ref 8–27)
CO2: 27 mmol/L (ref 20–29)
Calcium: 10 mg/dL (ref 8.7–10.3)
Chloride: 100 mmol/L (ref 96–106)
Creatinine, Ser: 0.9 mg/dL (ref 0.57–1.00)
Glucose: 92 mg/dL (ref 70–99)
Potassium: 4.8 mmol/L (ref 3.5–5.2)
Sodium: 142 mmol/L (ref 134–144)
eGFR: 66 mL/min/1.73 (ref >59)

## 2024-04-07 NOTE — Anesthesia Preprocedure Evaluation (Signed)
 Anesthesia Evaluation  Patient identified by MRN, date of birth, ID band Patient awake    Reviewed: Allergy & Precautions, H&P , NPO status , Patient's Chart, lab work & pertinent test results  History of Anesthesia Complications Negative for: history of anesthetic complications  Airway Mallampati: II  TM Distance: >3 FB Neck ROM: Full    Dental no notable dental hx.    Pulmonary asthma , former smoker   Pulmonary exam normal breath sounds clear to auscultation       Cardiovascular hypertension, Normal cardiovascular exam+ dysrhythmias Atrial Fibrillation  Rhythm:Irregular Rate:Normal  11/2023: IMPRESSIONS     1. Left ventricular ejection fraction, by estimation, is 55 to 60%. The  left ventricle has normal function. The left ventricle has no regional  wall motion abnormalities. There is mild concentric left ventricular  hypertrophy. Left ventricular diastolic  function could not be evaluated.   2. Right ventricular systolic function is normal. The right ventricular  size is normal. Tricuspid regurgitation signal is inadequate for assessing  PA pressure.   3. The mitral valve is normal in structure. Trivial mitral valve  regurgitation. No evidence of mitral stenosis.   4. The aortic valve is tricuspid. Aortic valve regurgitation is not  visualized. Aortic valve sclerosis/calcification is present, without any  evidence of aortic stenosis. Aortic valve area, by VTI measures 2.21 cm.  Aortic valve mean gradient measures  4.0 mmHg. Aortic valve Vmax measures 1.29 m/s.   5. The inferior vena cava is normal in size with greater than 50%  respiratory variability, suggesting right atrial pressure of 3 mmHg.     Neuro/Psych neg Seizures PSYCHIATRIC DISORDERS Anxiety        GI/Hepatic Neg liver ROS,,,Hx of gastric sleeve   Endo/Other  Hypothyroidism    Renal/GU negative Renal ROS  negative genitourinary    Musculoskeletal  (+) Arthritis ,    Abdominal  (+) + obese  Peds negative pediatric ROS (+)  Hematology negative hematology ROS (+)   Anesthesia Other Findings   Reproductive/Obstetrics negative OB ROS                              Anesthesia Physical Anesthesia Plan  ASA: 3  Anesthesia Plan: General   Post-op Pain Management:    Induction: Intravenous  PONV Risk Score and Plan: 3  Airway Management Planned: Natural Airway and Simple Face Mask  Additional Equipment:   Intra-op Plan:   Post-operative Plan: Extubation in OR  Informed Consent: I have reviewed the patients History and Physical, chart, labs and discussed the procedure including the risks, benefits and alternatives for the proposed anesthesia with the patient or authorized representative who has indicated his/her understanding and acceptance.     Dental advisory given  Plan Discussed with: CRNA  Anesthesia Plan Comments:          Anesthesia Quick Evaluation

## 2024-04-08 ENCOUNTER — Ambulatory Visit (HOSPITAL_COMMUNITY)

## 2024-04-08 ENCOUNTER — Encounter (HOSPITAL_COMMUNITY)
Admission: RE | Disposition: A | Discharge status: Home / Self Care | Payer: Self-pay | Source: Home / Self Care | Attending: Cardiology

## 2024-04-08 ENCOUNTER — Ambulatory Visit (HOSPITAL_COMMUNITY)
Admission: RE | Admit: 2024-04-08 | Discharge: 2024-04-08 | Disposition: A | Discharge status: Home / Self Care | Attending: Cardiology | Admitting: Cardiology

## 2024-04-08 ENCOUNTER — Encounter (HOSPITAL_COMMUNITY): Payer: Self-pay | Admitting: Cardiology

## 2024-04-08 ENCOUNTER — Other Ambulatory Visit: Payer: Self-pay

## 2024-04-08 DIAGNOSIS — J45909 Unspecified asthma, uncomplicated: Secondary | ICD-10-CM

## 2024-04-08 DIAGNOSIS — I4819 Other persistent atrial fibrillation: Secondary | ICD-10-CM

## 2024-04-08 DIAGNOSIS — Z87891 Personal history of nicotine dependence: Secondary | ICD-10-CM | POA: Diagnosis not present

## 2024-04-08 DIAGNOSIS — I4891 Unspecified atrial fibrillation: Secondary | ICD-10-CM

## 2024-04-08 DIAGNOSIS — Z006 Encounter for examination for normal comparison and control in clinical research program: Secondary | ICD-10-CM

## 2024-04-08 DIAGNOSIS — I10 Essential (primary) hypertension: Secondary | ICD-10-CM

## 2024-04-08 HISTORY — PX: CARDIOVERSION: EP1203

## 2024-04-08 SURGERY — CARDIOVERSION (CATH LAB)
Anesthesia: General

## 2024-04-08 MED ORDER — SODIUM CHLORIDE 0.9% FLUSH
INTRAVENOUS | Status: DC | PRN
  Administered 2024-04-08: 5 mL via INTRAVENOUS

## 2024-04-08 MED ORDER — LIDOCAINE 2% (20 MG/ML) 5 ML SYRINGE
INTRAMUSCULAR | Status: DC | PRN
  Administered 2024-04-08: 60 mg via INTRAVENOUS

## 2024-04-08 MED ORDER — SODIUM CHLORIDE 0.9 % IV SOLN: INTRAVENOUS | Status: DC

## 2024-04-08 MED ORDER — PROPOFOL 10 MG/ML IV BOLUS
INTRAVENOUS | Status: DC | PRN
  Administered 2024-04-08: 50 mg via INTRAVENOUS

## 2024-04-08 SURGICAL SUPPLY — 1 items: PAD DEFIB RADIO PHYSIO CONN (PAD) ×1 IMPLANT

## 2024-04-08 NOTE — Interval H&P Note (Signed)
 History and Physical Interval Note:  04/08/2024 8:18 AM  Lisa Parker  has presented today for surgery, with the diagnosis of AFIB.  The various methods of treatment have been discussed with the patient and family. After consideration of risks, benefits and other options for treatment, the patient has consented to  Procedure(s): CARDIOVERSION (N/A) as a surgical intervention.  The patient's history has been reviewed, patient examined, no change in status, stable for surgery.  I have reviewed the patient's chart and labs.  Questions were answered to the patient's satisfaction.     Macarius Ruark

## 2024-04-08 NOTE — Research (Signed)
 [facility] Cardioversion Informed Consent   Subject Name: Lisa Parker  Subject met inclusion and exclusion criteria.  The informed consent form, study requirements and expectations were reviewed with the subject and questions and concerns were addressed prior to the signing of the consent form.  The subject verbalized understanding of the trial requirements.  The subject agreed to participate in the [facility] Cardioversion trial and signed the informed consent at 0819 on 03/Dec/2025.  The informed consent was obtained prior to performance of any protocol-specific procedures for the subject.  A copy of the signed informed consent was given to the subject and a copy was placed in the subject's medical record.   Lisa Parker

## 2024-04-08 NOTE — Anesthesia Postprocedure Evaluation (Signed)
 Anesthesia Post Note  Patient: Lisa Parker  Procedure(s) Performed: CARDIOVERSION     Patient location during evaluation: PACU Anesthesia Type: General Level of consciousness: awake and alert Pain management: pain level controlled Vital Signs Assessment: post-procedure vital signs reviewed and stable Respiratory status: spontaneous breathing, nonlabored ventilation, respiratory function stable and patient connected to nasal cannula oxygen Cardiovascular status: blood pressure returned to baseline and stable Postop Assessment: no apparent nausea or vomiting Anesthetic complications: no   No notable events documented.  Last Vitals:  Vitals:   04/08/24 0850 04/08/24 0855  BP: (!) 164/73 (!) 176/80  Pulse: (!) 44 (!) 49  Resp: 15 13  Temp:    SpO2: 95% 96%    Last Pain:  Vitals:   04/08/24 0839  TempSrc: Temporal  PainSc: 0-No pain                 Thom JONELLE Peoples

## 2024-04-08 NOTE — Transfer of Care (Signed)
 Immediate Anesthesia Transfer of Care Note  Patient: Lisa Parker  Procedure(s) Performed: CARDIOVERSION  Patient Location: PACU and Cath Lab  Anesthesia Type:General  Level of Consciousness: awake, alert , oriented, and drowsy  Airway & Oxygen Therapy: Patient Spontanous Breathing  Post-op Assessment: Report given to RN, Post -op Vital signs reviewed and stable, and Patient moving all extremities X 4  Post vital signs: Reviewed and stable  Last Vitals:  Vitals Value Taken Time  BP    Temp    Pulse    Resp    SpO2      Last Pain:  Vitals:   04/08/24 0826  TempSrc:   PainSc: 0-No pain         Complications: No notable events documented.

## 2024-04-08 NOTE — CV Procedure (Signed)
   Electrical Cardioversion Procedure Note JENENE KAUFFMANN [id number] 05/30/1945  Procedure: Electrical Cardioversion Indications:  Atrial Fibrillation  Time Out: Verified patient identification, verified procedure,medications/allergies/relevent history reviewed, required imaging and test results available. Time out was performed  Procedure Details  The patient signed informed consent.   The patient was NPO past midnight. Has had therapeutic anticoagulation with Eliquis  greater than 3 weeks. The patient denies any interruption of anticoagulation.  Anesthesia was administered by Dr. Erma.  Adequate airway was maintained throughout and vital followed per protocol.  He was cardioverted x 1 with 200 J of biphasic synchronized energy.  He converted to NSR.  There were no apparent complications.  The patient tolerated the procedure well and had normal neuro status and respiratory status post procedure with vitals stable as recorded elsewhere.     IMPRESSION:  Successful cardioversion of atrial fibrillation   Follow up:  We will arrange follow up with primary cardiology.  He will continue on current medical therapy.  The patient advised to continue anticoagulation.  Amanda Steuart 04/08/2024, 8:41 AM

## 2024-04-21 ENCOUNTER — Other Ambulatory Visit (HOSPITAL_COMMUNITY): Payer: Self-pay

## 2024-06-25 ENCOUNTER — Ambulatory Visit: Admitting: Internal Medicine

## 2024-07-16 ENCOUNTER — Ambulatory Visit: Admitting: Internal Medicine

## 2024-08-25 ENCOUNTER — Ambulatory Visit: Admitting: Neurology
